# Patient Record
Sex: Male | Born: 1940 | Race: Black or African American | Hispanic: No | Marital: Married | Smoking: Never smoker
Health system: Southern US, Community
[De-identification: ages and names within clinical notes are randomized; demographics above are authoritative.]

## PROBLEM LIST (undated history)

## (undated) DIAGNOSIS — R3911 Hesitancy of micturition: Secondary | ICD-10-CM

## (undated) DIAGNOSIS — C9000 Multiple myeloma not having achieved remission: Secondary | ICD-10-CM

## (undated) DIAGNOSIS — M1990 Unspecified osteoarthritis, unspecified site: Secondary | ICD-10-CM

## (undated) DIAGNOSIS — I10 Essential (primary) hypertension: Secondary | ICD-10-CM

## (undated) HISTORY — PX: TRANSURETHRAL RESECTION OF PROSTATE: SHX73

---

## 1996-12-04 HISTORY — PX: BRAIN SURGERY: SHX531

## 2012-12-04 DIAGNOSIS — C9000 Multiple myeloma not having achieved remission: Secondary | ICD-10-CM

## 2012-12-04 HISTORY — DX: Multiple myeloma not having achieved remission: C90.00

## 2013-03-18 ENCOUNTER — Other Ambulatory Visit: Payer: Self-pay | Admitting: Neurosurgery

## 2013-03-19 ENCOUNTER — Encounter (HOSPITAL_COMMUNITY)
Admission: RE | Admit: 2013-03-19 | Discharge: 2013-03-19 | Disposition: A | Discharge status: Home / Self Care | Payer: Medicare Other | Source: Ambulatory Visit | Attending: Neurosurgery | Admitting: Neurosurgery

## 2013-03-19 ENCOUNTER — Encounter (HOSPITAL_COMMUNITY)
Admission: RE | Admit: 2013-03-19 | Discharge: 2013-03-19 | Disposition: A | Discharge status: Home / Self Care | Payer: Medicare Other | Source: Ambulatory Visit | Attending: Anesthesiology | Admitting: Anesthesiology

## 2013-03-19 ENCOUNTER — Encounter (HOSPITAL_COMMUNITY): Payer: Self-pay

## 2013-03-19 HISTORY — DX: Essential (primary) hypertension: I10

## 2013-03-19 HISTORY — DX: Unspecified osteoarthritis, unspecified site: M19.90

## 2013-03-19 LAB — CBC
Hemoglobin: 8.6 g/dL — ABNORMAL LOW (ref 13.0–17.0)
MCH: 28.7 pg (ref 26.0–34.0)
MCV: 86.7 fL (ref 78.0–100.0)
RBC: 3 MIL/uL — ABNORMAL LOW (ref 4.22–5.81)
WBC: 5.7 10*3/uL (ref 4.0–10.5)

## 2013-03-19 LAB — BASIC METABOLIC PANEL
CO2: 25 mEq/L (ref 19–32)
Glucose, Bld: 107 mg/dL — ABNORMAL HIGH (ref 70–99)
Potassium: 4.3 mEq/L (ref 3.5–5.1)
Sodium: 131 mEq/L — ABNORMAL LOW (ref 135–145)

## 2013-03-19 MED ORDER — CEFAZOLIN SODIUM-DEXTROSE 2-3 GM-% IV SOLR
2.0000 g | INTRAVENOUS | Status: AC
  Administered 2013-03-20: 2 g via INTRAVENOUS
  Filled 2013-03-19: qty 50

## 2013-03-19 MED ORDER — DEXAMETHASONE SODIUM PHOSPHATE 10 MG/ML IJ SOLN
10.0000 mg | INTRAMUSCULAR | Status: AC
  Administered 2013-03-20: 10 mg via INTRAVENOUS
  Filled 2013-03-19: qty 1

## 2013-03-19 NOTE — Progress Notes (Signed)
Consulted with dr Lodema Hong re labs.please call ologist in rm in am to inform about labs

## 2013-03-19 NOTE — Pre-Procedure Instructions (Signed)
Anthony Walls  03/19/2013   Your procedure is scheduled on:  03/20/13  Report to [facility] at 930 AM.  Call this number if you have problems the morning of surgery: [phone]   Remember:   Do not eat food or drink liquids after midnight. Take with a small sip of  Pain med ,cyclobenzaprine,flomax, dexamethasone    Do not wear jewelry, make-up or nail polish.  Do not wear lotions, powders, or perfumes. You may wear deodorant.  Do not shave 48 hours prior to surgery. Men may shave face and neck.  Do not bring valuables to the hospital.  Contacts, dentures or bridgework may not be worn into surgery.  Leave suitcase in the car. After surgery it may be brought to your room.  For patients admitted to the hospital, checkout time is 11:00 AM the day of  discharge.   Patients discharged the day of surgery will not be allowed to drive  home.  Name and phone number of your driver:  Special Instructions: Shower using CHG 2 nights before surgery and the night before surgery.  If you shower the day of surgery use CHG.  Use special wash - you have one bottle of CHG for all showers.  You should use approximately 1/3 of the bottle for each shower.   Please read over the following fact sheets that you were given: Pain Booklet, Coughing and Deep Breathing, Blood Transfusion Information, MRSA Information and Surgical Site Infection Prevention

## 2013-03-20 ENCOUNTER — Encounter (HOSPITAL_COMMUNITY)
Admission: RE | Disposition: A | Discharge status: Rehabilitation Facility | Payer: Self-pay | Source: Ambulatory Visit | Attending: Neurosurgery

## 2013-03-20 ENCOUNTER — Inpatient Hospital Stay (HOSPITAL_COMMUNITY)
Admission: RE | Admit: 2013-03-20 | Discharge: 2013-03-25 | DRG: 821 | Disposition: A | Discharge status: Rehabilitation Facility | Payer: Medicare Other | Source: Ambulatory Visit | Attending: Neurosurgery | Admitting: Neurosurgery

## 2013-03-20 ENCOUNTER — Inpatient Hospital Stay (HOSPITAL_COMMUNITY): Payer: Medicare Other | Admitting: Anesthesiology

## 2013-03-20 ENCOUNTER — Inpatient Hospital Stay (HOSPITAL_COMMUNITY): Payer: Medicare Other

## 2013-03-20 ENCOUNTER — Encounter (HOSPITAL_COMMUNITY): Payer: Self-pay | Admitting: Vascular Surgery

## 2013-03-20 ENCOUNTER — Encounter (HOSPITAL_COMMUNITY): Payer: Self-pay | Admitting: *Deleted

## 2013-03-20 DIAGNOSIS — K5900 Constipation, unspecified: Secondary | ICD-10-CM | POA: Diagnosis not present

## 2013-03-20 DIAGNOSIS — G992 Myelopathy in diseases classified elsewhere: Secondary | ICD-10-CM | POA: Diagnosis present

## 2013-03-20 DIAGNOSIS — I9589 Other hypotension: Secondary | ICD-10-CM | POA: Diagnosis not present

## 2013-03-20 DIAGNOSIS — Z01812 Encounter for preprocedural laboratory examination: Secondary | ICD-10-CM

## 2013-03-20 DIAGNOSIS — Z833 Family history of diabetes mellitus: Secondary | ICD-10-CM

## 2013-03-20 DIAGNOSIS — C9030 Solitary plasmacytoma not having achieved remission: Principal | ICD-10-CM | POA: Diagnosis present

## 2013-03-20 DIAGNOSIS — Z79899 Other long term (current) drug therapy: Secondary | ICD-10-CM

## 2013-03-20 DIAGNOSIS — D62 Acute posthemorrhagic anemia: Secondary | ICD-10-CM | POA: Diagnosis not present

## 2013-03-20 DIAGNOSIS — I10 Essential (primary) hypertension: Secondary | ICD-10-CM | POA: Diagnosis present

## 2013-03-20 DIAGNOSIS — R0902 Hypoxemia: Secondary | ICD-10-CM | POA: Diagnosis not present

## 2013-03-20 HISTORY — PX: LAMINECTOMY: SHX219

## 2013-03-20 HISTORY — DX: Hesitancy of micturition: R39.11

## 2013-03-20 SURGERY — THORACIC LAMINECTOMY FOR TUMOR
Anesthesia: General | Wound class: Clean

## 2013-03-20 MED ORDER — CYCLOBENZAPRINE HCL 10 MG PO TABS
10.0000 mg | ORAL_TABLET | Freq: Three times a day (TID) | ORAL | Status: DC | PRN
  Filled 2013-03-20: qty 1

## 2013-03-20 MED ORDER — KCL IN DEXTROSE-NACL 20-5-0.45 MEQ/L-%-% IV SOLN
80.0000 mL/h | INTRAVENOUS | Status: DC
  Administered 2013-03-20 – 2013-03-21 (×2): 80 mL/h via INTRAVENOUS
  Filled 2013-03-20 (×11): qty 1000

## 2013-03-20 MED ORDER — PROMETHAZINE HCL 25 MG/ML IJ SOLN: 6.2500 mg | INTRAMUSCULAR | Status: DC | PRN

## 2013-03-20 MED ORDER — ACETAMINOPHEN 10 MG/ML IV SOLN
INTRAVENOUS | Status: AC
  Administered 2013-03-20: 1000 mg via INTRAVENOUS
  Filled 2013-03-20: qty 100

## 2013-03-20 MED ORDER — GLYCOPYRROLATE 0.2 MG/ML IJ SOLN
INTRAMUSCULAR | Status: DC | PRN
  Administered 2013-03-20: .8 mg via INTRAVENOUS

## 2013-03-20 MED ORDER — 0.9 % SODIUM CHLORIDE (POUR BTL) OPTIME
TOPICAL | Status: DC | PRN
  Administered 2013-03-20: 1000 mL

## 2013-03-20 MED ORDER — ARTIFICIAL TEARS OP OINT
TOPICAL_OINTMENT | OPHTHALMIC | Status: DC | PRN
  Administered 2013-03-20: 1 via OPHTHALMIC

## 2013-03-20 MED ORDER — SODIUM CHLORIDE 0.9 % IV SOLN
INTRAVENOUS | Status: AC
  Filled 2013-03-20: qty 500

## 2013-03-20 MED ORDER — ONDANSETRON HCL 4 MG/2ML IJ SOLN
4.0000 mg | INTRAMUSCULAR | Status: DC | PRN
  Administered 2013-03-23 – 2013-03-25 (×2): 4 mg via INTRAVENOUS
  Filled 2013-03-20 (×2): qty 2

## 2013-03-20 MED ORDER — THROMBIN 20000 UNITS EX SOLR
CUTANEOUS | Status: DC | PRN
  Administered 2013-03-20 (×2): via TOPICAL

## 2013-03-20 MED ORDER — NEOSTIGMINE METHYLSULFATE 1 MG/ML IJ SOLN
INTRAMUSCULAR | Status: DC | PRN
  Administered 2013-03-20: 5 mg via INTRAVENOUS

## 2013-03-20 MED ORDER — SODIUM CHLORIDE 0.9 % IJ SOLN: 3.0000 mL | INTRAMUSCULAR | Status: DC | PRN

## 2013-03-20 MED ORDER — OXYCODONE HCL 5 MG PO TABS: 5.0000 mg | ORAL_TABLET | Freq: Once | ORAL | Status: DC | PRN

## 2013-03-20 MED ORDER — DEXAMETHASONE SODIUM PHOSPHATE 4 MG/ML IJ SOLN
4.0000 mg | Freq: Four times a day (QID) | INTRAMUSCULAR | Status: AC
  Administered 2013-03-20: 4 mg via INTRAVENOUS
  Filled 2013-03-20 (×2): qty 1

## 2013-03-20 MED ORDER — OXYCODONE HCL 5 MG/5ML PO SOLN: 5.0000 mg | Freq: Once | ORAL | Status: DC | PRN

## 2013-03-20 MED ORDER — DEXAMETHASONE 4 MG PO TABS
4.0000 mg | ORAL_TABLET | Freq: Four times a day (QID) | ORAL | Status: AC
  Administered 2013-03-21 (×3): 4 mg via ORAL
  Filled 2013-03-20 (×3): qty 1

## 2013-03-20 MED ORDER — ALBUMIN HUMAN 5 % IV SOLN
INTRAVENOUS | Status: DC | PRN
  Administered 2013-03-20 (×2): via INTRAVENOUS

## 2013-03-20 MED ORDER — LIDOCAINE HCL (CARDIAC) 20 MG/ML IV SOLN
INTRAVENOUS | Status: DC | PRN
  Administered 2013-03-20: 100 mg via INTRAVENOUS

## 2013-03-20 MED ORDER — BUPIVACAINE HCL (PF) 0.5 % IJ SOLN
INTRAMUSCULAR | Status: DC | PRN
  Administered 2013-03-20: 30 mL

## 2013-03-20 MED ORDER — DEXTROSE 5 % IV SOLN
INTRAVENOUS | Status: DC | PRN
  Administered 2013-03-20 (×2): via INTRAVENOUS

## 2013-03-20 MED ORDER — PROPOFOL 10 MG/ML IV BOLUS
INTRAVENOUS | Status: DC | PRN
  Administered 2013-03-20: 170 mg via INTRAVENOUS

## 2013-03-20 MED ORDER — MENTHOL 3 MG MT LOZG: 1.0000 | LOZENGE | OROMUCOSAL | Status: DC | PRN

## 2013-03-20 MED ORDER — MORPHINE SULFATE 2 MG/ML IJ SOLN: 1.0000 mg | INTRAMUSCULAR | Status: DC | PRN

## 2013-03-20 MED ORDER — BACITRACIN 50000 UNITS IM SOLR
INTRAMUSCULAR | Status: AC
  Filled 2013-03-20: qty 1

## 2013-03-20 MED ORDER — CEFAZOLIN SODIUM-DEXTROSE 2-3 GM-% IV SOLR
2.0000 g | Freq: Three times a day (TID) | INTRAVENOUS | Status: AC
  Administered 2013-03-20 – 2013-03-21 (×4): 2 g via INTRAVENOUS
  Filled 2013-03-20 (×5): qty 50

## 2013-03-20 MED ORDER — ACETAMINOPHEN 650 MG RE SUPP: 650.0000 mg | RECTAL | Status: DC | PRN

## 2013-03-20 MED ORDER — FENTANYL CITRATE 0.05 MG/ML IJ SOLN
INTRAMUSCULAR | Status: DC | PRN
  Administered 2013-03-20 (×2): 100 ug via INTRAVENOUS
  Administered 2013-03-20: 50 ug via INTRAVENOUS

## 2013-03-20 MED ORDER — LACTATED RINGERS IV SOLN
INTRAVENOUS | Status: DC | PRN
  Administered 2013-03-20 (×2): via INTRAVENOUS

## 2013-03-20 MED ORDER — ONDANSETRON HCL 4 MG/2ML IJ SOLN
INTRAMUSCULAR | Status: DC | PRN
  Administered 2013-03-20: 4 mg via INTRAVENOUS

## 2013-03-20 MED ORDER — OXYCODONE-ACETAMINOPHEN 5-325 MG PO TABS
1.0000 | ORAL_TABLET | ORAL | Status: DC | PRN
  Administered 2013-03-22 – 2013-03-25 (×2): 1 via ORAL
  Filled 2013-03-20 (×2): qty 1

## 2013-03-20 MED ORDER — ACETAMINOPHEN 325 MG PO TABS
650.0000 mg | ORAL_TABLET | ORAL | Status: DC | PRN
  Administered 2013-03-23 – 2013-03-24 (×2): 650 mg via ORAL
  Filled 2013-03-20 (×2): qty 2

## 2013-03-20 MED ORDER — SODIUM CHLORIDE 0.9 % IV SOLN: 250.0000 mL | INTRAVENOUS | Status: DC

## 2013-03-20 MED ORDER — THROMBIN 5000 UNITS EX SOLR
OROMUCOSAL | Status: DC | PRN
  Administered 2013-03-20 (×2): via TOPICAL

## 2013-03-20 MED ORDER — ROCURONIUM BROMIDE 100 MG/10ML IV SOLN
INTRAVENOUS | Status: DC | PRN
  Administered 2013-03-20 (×2): 10 mg via INTRAVENOUS
  Administered 2013-03-20: 70 mg via INTRAVENOUS

## 2013-03-20 MED ORDER — LOSARTAN POTASSIUM 50 MG PO TABS
50.0000 mg | ORAL_TABLET | Freq: Every day | ORAL | Status: DC
  Administered 2013-03-21 – 2013-03-25 (×4): 50 mg via ORAL
  Filled 2013-03-20 (×5): qty 1

## 2013-03-20 MED ORDER — LACTATED RINGERS IV SOLN
INTRAVENOUS | Status: DC | PRN
  Administered 2013-03-20: 12:00:00 via INTRAVENOUS

## 2013-03-20 MED ORDER — TAMSULOSIN HCL 0.4 MG PO CAPS
0.4000 mg | ORAL_CAPSULE | Freq: Every day | ORAL | Status: DC
  Administered 2013-03-20 – 2013-03-25 (×6): 0.4 mg via ORAL
  Filled 2013-03-20 (×6): qty 1

## 2013-03-20 MED ORDER — BACITRACIN 50000 UNITS IM SOLR
INTRAMUSCULAR | Status: DC | PRN
  Administered 2013-03-20: 13:00:00

## 2013-03-20 MED ORDER — SODIUM CHLORIDE 0.9 % IJ SOLN
3.0000 mL | Freq: Two times a day (BID) | INTRAMUSCULAR | Status: DC
  Administered 2013-03-21 – 2013-03-25 (×8): 3 mL via INTRAVENOUS

## 2013-03-20 MED ORDER — PHENOL 1.4 % MT LIQD: 1.0000 | OROMUCOSAL | Status: DC | PRN

## 2013-03-20 MED ORDER — SODIUM CHLORIDE 0.9 % IV SOLN
INTRAVENOUS | Status: DC | PRN
  Administered 2013-03-20: 15:00:00 via INTRAVENOUS

## 2013-03-20 MED ORDER — HYDROMORPHONE HCL PF 1 MG/ML IJ SOLN: 0.2500 mg | INTRAMUSCULAR | Status: DC | PRN

## 2013-03-20 SURGICAL SUPPLY — 83 items
BAG DECANTER FOR FLEXI CONT (MISCELLANEOUS) ×2 IMPLANT
BENZOIN TINCTURE PRP APPL 2/3 (GAUZE/BANDAGES/DRESSINGS) ×2 IMPLANT
BLADE SURG 11 STRL SS (BLADE) IMPLANT
BLADE SURG ROTATE 9660 (MISCELLANEOUS) IMPLANT
BONE CANC CHIPS 40CC CAN1/2 (Bone Implant) ×2 IMPLANT
BONE EQUIVA 10CC (Bone Implant) ×4 IMPLANT
BRUSH SCRUB EZ PLAIN DRY (MISCELLANEOUS) ×2 IMPLANT
BUR CUTTER 7.0 ROUND (BURR) ×2 IMPLANT
BUR MATCHSTICK NEURO 3.0 LAGG (BURR) ×2 IMPLANT
BUR PRECISION FLUTE 6.0 (BURR) IMPLANT
CANISTER SUCTION 2500CC (MISCELLANEOUS) ×2 IMPLANT
CHIPS CANC BONE 40CC CAN1/2 (Bone Implant) ×1 IMPLANT
CLOTH BEACON ORANGE TIMEOUT ST (SAFETY) ×2 IMPLANT
CONT SPEC 4OZ CLIKSEAL STRL BL (MISCELLANEOUS) ×4 IMPLANT
COVER TABLE BACK 60X90 (DRAPES) ×2 IMPLANT
DECANTER SPIKE VIAL GLASS SM (MISCELLANEOUS) IMPLANT
DERMABOND ADVANCED (GAUZE/BANDAGES/DRESSINGS) ×2
DERMABOND ADVANCED .7 DNX12 (GAUZE/BANDAGES/DRESSINGS) ×2 IMPLANT
DRAPE C-ARM 42X72 X-RAY (DRAPES) ×4 IMPLANT
DRAPE C-ARMOR (DRAPES) ×2 IMPLANT
DRAPE LAPAROTOMY 100X72 PEDS (DRAPES) IMPLANT
DRAPE LAPAROTOMY 100X72X124 (DRAPES) ×2 IMPLANT
DRAPE LAPAROTOMY T 102X78X121 (DRAPES) IMPLANT
DRAPE MICROSCOPE LEICA (MISCELLANEOUS) ×2 IMPLANT
DRAPE MICROSCOPE ZEISS OPMI (DRAPES) IMPLANT
DRAPE POUCH INSTRU U-SHP 10X18 (DRAPES) ×2 IMPLANT
DRAPE SURG 17X23 STRL (DRAPES) ×4 IMPLANT
DRESSING TELFA 8X3 (GAUZE/BANDAGES/DRESSINGS) ×2 IMPLANT
DURAPREP 26ML APPLICATOR (WOUND CARE) ×2 IMPLANT
ELECT REM PT RETURN 9FT ADLT (ELECTROSURGICAL) ×2
ELECTRODE REM PT RTRN 9FT ADLT (ELECTROSURGICAL) ×1 IMPLANT
EVACUATOR 1/8 PVC DRAIN (DRAIN) IMPLANT
EVACUATOR 3/16  PVC DRAIN (DRAIN) ×1
EVACUATOR 3/16 PVC DRAIN (DRAIN) ×1 IMPLANT
GAUZE SPONGE 4X4 16PLY XRAY LF (GAUZE/BANDAGES/DRESSINGS) ×2 IMPLANT
GLOVE BIO SURGEON STRL SZ8 (GLOVE) ×2 IMPLANT
GLOVE BIOGEL PI IND STRL 7.0 (GLOVE) ×1 IMPLANT
GLOVE BIOGEL PI INDICATOR 7.0 (GLOVE) ×1
GLOVE ECLIPSE 7.5 STRL STRAW (GLOVE) ×4 IMPLANT
GLOVE EXAM NITRILE LRG STRL (GLOVE) IMPLANT
GLOVE EXAM NITRILE MD LF STRL (GLOVE) ×4 IMPLANT
GLOVE EXAM NITRILE XL STR (GLOVE) IMPLANT
GLOVE EXAM NITRILE XS STR PU (GLOVE) IMPLANT
GLOVE INDICATOR 8.5 STRL (GLOVE) ×4 IMPLANT
GLOVE SURG SS PI 7.0 STRL IVOR (GLOVE) ×4 IMPLANT
GOWN BRE IMP SLV AUR LG STRL (GOWN DISPOSABLE) ×4 IMPLANT
GOWN BRE IMP SLV AUR XL STRL (GOWN DISPOSABLE) ×6 IMPLANT
GOWN STRL REIN 2XL LVL4 (GOWN DISPOSABLE) IMPLANT
HEMOSTAT POWDER KIT SURGIFOAM (HEMOSTASIS) ×4 IMPLANT
HEMOSTAT SURGICEL 2X14 (HEMOSTASIS) IMPLANT
KIT BASIN OR (CUSTOM PROCEDURE TRAY) ×2 IMPLANT
KIT ROOM TURNOVER OR (KITS) ×2 IMPLANT
MILL MEDIUM DISP (BLADE) ×2 IMPLANT
NEEDLE HYPO 22GX1.5 SAFETY (NEEDLE) ×2 IMPLANT
NS IRRIG 1000ML POUR BTL (IV SOLUTION) ×2 IMPLANT
PACK LAMINECTOMY NEURO (CUSTOM PROCEDURE TRAY) ×2 IMPLANT
PATTIES SURGICAL .5 X3 (DISPOSABLE) IMPLANT
PEDIGUARD CURV (INSTRUMENTS) ×2 IMPLANT
PIN MAYFIELD SKULL DISP (PIN) IMPLANT
ROD PERBENT 80MM (Rod) ×4 IMPLANT
RUBBERBAND STERILE (MISCELLANEOUS) ×4 IMPLANT
SCREW SEQUOIA 5.5X40MM (Screw) ×8 IMPLANT
SLEEVE SURGEON STRL (DRAPES) ×2 IMPLANT
SPONGE GAUZE 4X4 12PLY (GAUZE/BANDAGES/DRESSINGS) ×2 IMPLANT
SPONGE LAP 4X18 X RAY DECT (DISPOSABLE) IMPLANT
SPONGE SURGIFOAM ABS GEL 100 (HEMOSTASIS) ×4 IMPLANT
SPONGE SURGIFOAM ABS GEL SZ50 (HEMOSTASIS) IMPLANT
STAPLER VISISTAT 35W (STAPLE) IMPLANT
STRIP CLOSURE SKIN 1/2X4 (GAUZE/BANDAGES/DRESSINGS) ×6 IMPLANT
SUT ETHILON 4 0 PS 2 18 (SUTURE) IMPLANT
SUT NURALON 4 0 TR CR/8 (SUTURE) IMPLANT
SUT PROLENE 6 0 BV (SUTURE) IMPLANT
SUT VIC AB 0 CT1 18XCR BRD8 (SUTURE) ×1 IMPLANT
SUT VIC AB 0 CT1 8-18 (SUTURE) ×1
SUT VIC AB 2-0 OS6 18 (SUTURE) ×10 IMPLANT
SUT VIC AB 3-0 CP2 18 (SUTURE) ×2 IMPLANT
SYR 20ML ECCENTRIC (SYRINGE) IMPLANT
TAPE CLOTH 4X10 WHT NS (GAUZE/BANDAGES/DRESSINGS) ×2 IMPLANT
TOP CLSR SEQUOIA (Orthopedic Implant) ×8 IMPLANT
TOWEL OR 17X24 6PK STRL BLUE (TOWEL DISPOSABLE) ×2 IMPLANT
TOWEL OR 17X26 10 PK STRL BLUE (TOWEL DISPOSABLE) ×2 IMPLANT
TRAY FOLEY CATH 14FRSI W/METER (CATHETERS) ×2 IMPLANT
WATER STERILE IRR 1000ML POUR (IV SOLUTION) ×2 IMPLANT

## 2013-03-20 NOTE — Anesthesia Preprocedure Evaluation (Addendum)
Anesthesia Evaluation  Patient identified by MRN, date of birth, ID band Patient awake    Reviewed: Allergy & Precautions, H&P , NPO status , Patient's Chart, lab work & pertinent test results  History of Anesthesia Complications Negative for: history of anesthetic complications  Airway Mallampati: II TM Distance: >3 FB Neck ROM: Full    Dental  (+) Dental Advisory Given, Edentulous Upper and Edentulous Lower   Pulmonary neg pulmonary ROS,  03-19-13 Chest X-Ray CHEST - 2 VIEW   Comparison: None.   Findings: Cardiomediastinal silhouette is unremarkable.  Mild degenerative changes thoracic spine.  No acute infiltrate or pleural effusion.  No pulmonary edema.  Probable old fracture of the left fifth rib.   IMPRESSION: No active disease.  Degenerative changes thoracic spine.      Pulmonary exam normal       Cardiovascular Exercise Tolerance: Poor hypertension, Pt. on medications Rhythm:Irregular Rate:Normal  19-Mar-2013 15:01:08 [facility] ROUTINE RECORD Sinus rhythm with marked sinus arrhythmia with occasional Premature ventricular complexes Left ventricular hypertrophy Abnormal ECG No old tracing to compare 61mm/s 35mm/mV 100Hz  8.0.1 12SL 241 CID: 1 Referred by: Aliene Beams Confirmed By: Susa Griffins MD Vent. rate 80 BPM   Neuro/Psych negative neurological ROS  negative psych ROS   GI/Hepatic negative GI ROS, Neg liver ROS,   Endo/Other  negative endocrine ROS  Renal/GU negative Renal ROS     Musculoskeletal   Abdominal   Peds  Hematology   Anesthesia Other Findings   Reproductive/Obstetrics                      Anesthesia Physical Anesthesia Plan  ASA: III  Anesthesia Plan: General   Post-op Pain Management:    Induction: Intravenous  Airway Management Planned: Oral ETT  Additional Equipment:   Intra-op Plan:   Post-operative Plan: Extubation  in OR  Informed Consent: I have reviewed the patients History and Physical, chart, labs and discussed the procedure including the risks, benefits and alternatives for the proposed anesthesia with the patient or authorized representative who has indicated his/her understanding and acceptance.   Dental advisory given  Plan Discussed with: CRNA, Anesthesiologist and Surgeon  Anesthesia Plan Comments:        Anesthesia Quick Evaluation

## 2013-03-20 NOTE — Progress Notes (Signed)
Dr. Krista Blue notified of preop labs

## 2013-03-20 NOTE — Progress Notes (Addendum)
Patient reports that he has been unable to urinate since last night. Pt without suprapubic tenderness

## 2013-03-20 NOTE — OR Nursing (Signed)
Upon foley insertion it was noted that the patient was already dribbling urine on self. Also, patient has hypospadias. Foley insertion went without any problems.

## 2013-03-20 NOTE — H&P (Signed)
Anthony Walls is an 72 y.o. male.   Chief Complaint: Lower extremity discoordination HPI: The patient is a 72 year old gentleman who was evaluated in the office recently for lower extremity discoordination and difficulty with his gait. He reports a month or greater history of pain in his back and some difficulty with gait. Over the last few weeks however this is become markedly worse particularly with difficulty ambulating. 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 There was marked spinal cord compression in this area this was felt to be his clinical problem. The options were discussed and because of his progressive difficulty and large abnormality noted was elected to the patient this time for decompression and pedicle screw instrumentation with onlay fusion. I've had a long discussion with him regarding the risks and benefits of surgical intervention. The risks discussed include but are not limited to bleeding and infection weakness numbness paralysis trouble with urination sexual function spinal fluid leakage coma and death. We have discussed alternative methods of therapy offered risks and benefits of nonintervention. He has had the opportunity to ask numerous questions and appears to understand. With this information in hand he has requested that we proceed with surgery.  Past Medical History  Diagnosis Date  . Hypertension   . Arthritis     Past Surgical History  Procedure Laterality Date  . Brain surgery  98    tumor    History reviewed. No pertinent family history. Social History:  reports that he has never smoked. He does not have any smokeless tobacco history on file. He reports that he does not drink alcohol or use illicit drugs.  Allergies: No Known Allergies  Medications Prior to Admission  Medication Sig Dispense Refill  .  cholestyramine (QUESTRAN) 4 G packet Take 1 packet by mouth daily.      . cyclobenzaprine (FLEXERIL) 10 MG tablet Take 10 mg by mouth 3 (three) times daily as needed for muscle spasms.      Marland Kitchen dexamethasone (DECADRON) 4 MG tablet Take 4 mg by mouth 4 (four) times daily.      Marland Kitchen etodolac (LODINE) 300 MG capsule Take 300 mg by mouth every 4 (four) hours as needed (for inflammation).      Marland Kitchen HYDROcodone-acetaminophen (NORCO/VICODIN) 5-325 MG per tablet Take 1 tablet by mouth every 4 (four) hours as needed for pain.      Marland Kitchen losartan (COZAAR) 50 MG tablet Take 50 mg by mouth daily.      . mupirocin ointment (BACTROBAN) 2 % Apply topically 3 (three) times daily.      . tamsulosin (FLOMAX) 0.4 MG CAPS Take 0.4 mg by mouth daily.        Results for orders placed during the hospital encounter of 03/19/13 (from the past 48 hour(s))  BASIC METABOLIC PANEL     Status: Abnormal   Collection Time    03/19/13  2:40 PM      Result Value Range   Sodium 131 (*) 135 - 145 mEq/L   Potassium 4.3  3.5 - 5.1 mEq/L   Chloride 98  96 - 112 mEq/L   CO2 25  19 - 32 mEq/L   Glucose, Bld 107 (*) 70 - 99 mg/dL   BUN 28 (*) 6 - 23 mg/dL   Creatinine, Ser 1.61  0.50 - 1.35 mg/dL   Calcium 9.4  8.4 - 09.6 mg/dL  GFR calc non Af Amer 52 (*) >90 mL/min   GFR calc Af Amer 61 (*) >90 mL/min   Comment:            The eGFR has been calculated     using the CKD EPI equation.     This calculation has not been     validated in all clinical     situations.     eGFR's persistently     <90 mL/min signify     possible Chronic Kidney Disease.  CBC     Status: Abnormal   Collection Time    03/19/13  2:40 PM      Result Value Range   WBC 5.7  4.0 - 10.5 K/uL   RBC 3.00 (*) 4.22 - 5.81 MIL/uL   Hemoglobin 8.6 (*) 13.0 - 17.0 g/dL   HCT 16.1 (*) 09.6 - 04.5 %   MCV 86.7  78.0 - 100.0 fL   MCH 28.7  26.0 - 34.0 pg   MCHC 33.1  30.0 - 36.0 g/dL   RDW 40.9 (*) 81.1 - 91.4 %   Platelets 310  150 - 400 K/uL  SURGICAL PCR SCREEN      Status: Abnormal   Collection Time    03/19/13  2:48 PM      Result Value Range   MRSA, PCR NEGATIVE  NEGATIVE   Staphylococcus aureus POSITIVE (*) NEGATIVE   Comment:            The Xpert SA Assay (FDA     approved for NASAL specimens     in patients over 77 years of age),     is one component of     a comprehensive surveillance     program.  Test performance has     been validated by [employer] for patients greater     than or equal to 45 year old.     It is not intended     to diagnose infection nor to     guide or monitor treatment.  TYPE AND SCREEN     Status: None   Collection Time    03/19/13  2:53 PM      Result Value Range   ABO/RH(D) O NEG     Antibody Screen NEG     Sample Expiration 03/22/2013    ABO/RH     Status: None   Collection Time    03/19/13  2:53 PM      Result Value Range   ABO/RH(D) O NEG     Dg Chest 2 View  03/19/2013  *RADIOLOGY REPORT*  Clinical Data: Preadmission for laminectomy  CHEST - 2 VIEW  Comparison: None.  Findings: Cardiomediastinal silhouette is unremarkable.  Mild degenerative changes thoracic spine.  No acute infiltrate or pleural effusion.  No pulmonary edema.  Probable old fracture of the left fifth rib.  IMPRESSION: No active disease.  Degenerative changes thoracic spine.   Original Report Authenticated By: Natasha Mead, M.D.     Review of systems not obtained due to patient factors.  Blood pressure 161/72, pulse 82, temperature 98.1 F (36.7 C), temperature source Oral, resp. rate 18, SpO2 99.00%.  The patient is awake or and oriented. His gait is markedly unstable and spastic and he is barely able to ambulate a few feet. For strengthening he'll muscle testing however is intact. He can feel light touch but says that it feels unusual in his lower extremities. 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 There obviously other clinical possibilities. The plan is for a decompression of the spinal dura with pedicle screws one level above and one level below and onlay fusion.  Reinaldo Meeker, MD 03/20/2013, 11:50 AM

## 2013-03-20 NOTE — Anesthesia Procedure Notes (Signed)
Procedure Name: Intubation Date/Time: 03/20/2013 12:26 PM Performed by: Tyrone Nine Pre-anesthesia Checklist: Patient identified, Timeout performed, Emergency Drugs available, Suction available and Patient being monitored Patient Re-evaluated:Patient Re-evaluated prior to inductionOxygen Delivery Method: Circle system utilized Preoxygenation: Pre-oxygenation with 100% oxygen Intubation Type: IV induction Ventilation: Mask ventilation without difficulty and Oral airway inserted - appropriate to patient size Laryngoscope Size: Mac and 3 Grade View: Grade I Tube type: Oral Tube size: 8.0 mm Number of attempts: 1 Airway Equipment and Method: Stylet Placement Confirmation: ETT inserted through vocal cords under direct vision,  positive ETCO2 and breath sounds checked- equal and bilateral Secured at: 23 cm Tube secured with: Tape Dental Injury: Teeth and Oropharynx as per pre-operative assessment

## 2013-03-20 NOTE — Transfer of Care (Signed)
Immediate Anesthesia Transfer of Care Note  Patient: Anthony Walls  Procedure(s) Performed: Procedure(s) with comments: THORACIC LAMINECTOMY FOR TUMOR (N/A) - Thoracic Laminectomy for Thoracic Ten Tumor, Pedicle Screws at Thoracic Nine through Eleven  Patient Location: PACU  Anesthesia Type:General  Level of Consciousness: awake, alert , oriented and patient cooperative  Airway & Oxygen Therapy: Patient Spontanous Breathing and Patient connected to face mask oxygen  Post-op Assessment: Report given to PACU RN and Post -op Vital signs reviewed and stable  Post vital signs: Reviewed and stable  Complications: No apparent anesthesia complications

## 2013-03-20 NOTE — Op Note (Signed)
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 Localizing fluoroscopy was used prior to incision to identify the appropriate level. Midline incision was made above the spinous processes of T9-T10 and T11. Using Bovie cutting current the incision was carried on the spinous processes. Subperiosteal dissection was then carried out on the spinous processes lamina facet joint and the head of the ribs of T9 and T11 bilaterally. Antebrachial of soft tissue and destruction of the spinous process at T10. Self-retaining tract was placed for exposure and x-ray showed approach the appropriate level. Using the Leksell rongeur spinous process and soft tissue in the epidural space was removed to decompress the underlying spinal dura. 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 We're able to get excellent removal of almost all of the soft tissue tumor including tumor that had eroded into the vertebral body and completely destroyed the pedicle and facet joint. This was noted bilaterally we therefore felt we would definitely need to stabilize the end of the case. When we had removed all the soft tissue tumor and had completely decompressed the spinal dura we looked for further compression and could not find any. Claudette Laws a severe gauge were carried out and any bleeding control proper coagulation and Gelfoam and Surgifoam. We then placed pedicle screws at T9 and T11 in standard  fashion. We used high-speed drill for entry points and then passed the ultrasound-guided pedicle all the pedicle without difficulty and followed in AP lateral fluoroscopy. We tapped with a 4.5 mm tap and then placed 5.5 x 40 mm screws at T9 and T11 bilaterally. We then decorticated the far lateral region and performed a posterolateral fusion with a mixture of bony croutons and morselized allograft. We then chose appropriate length rod and secured to the top of the screws with top loading nuts and did tightening and final tightening with torque and counter torque. We then confirmed hemostasis once more and left to epidural drain in the epidural space and brought out through separate stab incisions. The was then closed in multiple layers of Vicryl on the muscle fascia subcutaneous and subcuticular tissues. Dermabond Steri-Strips were placed on the skin. Shortness was then applied and the patient was extubated and taken to recovery room in stable condition.

## 2013-03-20 NOTE — Progress Notes (Signed)
Receiving report from Elise RN at this time   

## 2013-03-20 NOTE — Anesthesia Postprocedure Evaluation (Signed)
Anesthesia Post Note  Patient: Anthony Walls  Procedure(s) Performed: Procedure(s) (LRB): THORACIC LAMINECTOMY FOR TUMOR (N/A)  Anesthesia type: general  Patient location: PACU  Post pain: Pain level controlled  Post assessment: Patient's Cardiovascular Status Stable  Last Vitals:  Filed Vitals:   03/20/13 1653  BP: 137/72  Pulse: 58  Temp:   Resp: 11    Post vital signs: Reviewed and stable  Level of consciousness: sedated  Complications: No apparent anesthesia complications

## 2013-03-20 NOTE — Preoperative (Signed)
Beta Blockers   Reason not to administer Beta Blockers:Not Applicable 

## 2013-03-21 LAB — CBC
HCT: 22.9 % — ABNORMAL LOW (ref 39.0–52.0)
Hemoglobin: 7.9 g/dL — ABNORMAL LOW (ref 13.0–17.0)
MCV: 84.8 fL (ref 78.0–100.0)
RBC: 2.7 MIL/uL — ABNORMAL LOW (ref 4.22–5.81)
RDW: 16.6 % — ABNORMAL HIGH (ref 11.5–15.5)
WBC: 8.6 10*3/uL (ref 4.0–10.5)

## 2013-03-21 NOTE — Evaluation (Signed)
Physical Therapy Evaluation Patient Details Name: Anthony Walls MRN: [id number] DOB: Apr 25, 1941 Today's Date: 03/21/2013 Time: 4098-1191 PT Time Calculation (min): 43 min  PT Assessment / Plan / Recommendation Clinical Impression  Pt adm with thoracic tumor and underwent resection of tumor and then fusion of T9 - T11.  Needs skilled PT to maximize I and safety so pt can return home with supportive wife.  Pt's LE strength is functionally weaker than to formal muscle testing and pt uses arms heavily for all mobility.  Feel pt would be excellent CIR candidate.    PT Assessment  Patient needs continued PT services    Follow Up Recommendations  CIR    Does the patient have the potential to tolerate intense rehabilitation      Barriers to Discharge        Equipment Recommendations  Rolling walker with 5" wheels    Recommendations for Other Services     Frequency Min 5X/week    Precautions / Restrictions Precautions Precautions: Fall;Back Required Braces or Orthoses: Spinal Brace Spinal Brace: Lumbar corset;Applied in sitting position   Pertinent Vitals/Pain VSS      Mobility  Bed Mobility Bed Mobility: Rolling Right;Right Sidelying to Sit Rolling Right: 4: Min assist;With rail Right Sidelying to Sit: With rails;3: Mod assist Details for Bed Mobility Assistance: Verbal cues for technique and assist to bring legs off of bed and trunk up. Transfers Transfers: Sit to Stand;Stand to Sit;Squat Pivot Transfers Sit to Stand: 4: Min assist;With upper extremity assist;From bed Stand to Sit: 4: Min assist;With upper extremity assist;To bed;To chair/3-in-1;With armrests Squat Pivot Transfers: 4: Min assist;With armrests Details for Transfer Assistance: Pt with heavy use of arms for transfers.  When pivoting bed to chair pt sat on very edge of chair before scooting back to safer position. Ambulation/Gait Ambulation/Gait Assistance: 3: Mod assist Ambulation Distance (Feet): 10  Feet Assistive device: Rolling walker Ambulation/Gait Assistance Details: Verbal cues to stay closer to walker and to pause to more fully extend hips and knees.  Pt with very heavy reliance on UE's.  Gait Pattern: Step-to pattern;Right flexed knee in stance;Left flexed knee in stance;Decreased step length - right;Decreased step length - left    Exercises     PT Diagnosis: Difficulty walking;Generalized weakness  PT Problem List: Decreased strength;Decreased activity tolerance;Decreased balance;Decreased mobility;Decreased knowledge of use of DME;Decreased knowledge of precautions PT Treatment Interventions: DME instruction;Gait training;Patient/family education;Functional mobility training;Stair training;Therapeutic activities;Therapeutic exercise;Balance training;Neuromuscular re-education   PT Goals Acute Rehab PT Goals PT Goal Formulation: With patient Time For Goal Achievement: 04/04/13 Potential to Achieve Goals: Good Pt will Roll Supine to Right Side: with modified independence PT Goal: Rolling Supine to Right Side - Progress: Goal set today Pt will Roll Supine to Left Side: with modified independence PT Goal: Rolling Supine to Left Side - Progress: Goal set today Pt will go Supine/Side to Sit: with modified independence PT Goal: Supine/Side to Sit - Progress: Goal set today Pt will Sit at Edge of Bed: with modified independence PT Goal: Sit at [place] Of Bed - Progress: Goal set today Pt will go Sit to Supine/Side: with modified independence PT Goal: Sit to Supine/Side - Progress: Goal set today Pt will go Sit to Stand: with supervision PT Goal: Sit to Stand - Progress: Goal set today Pt will go Stand to Sit: with supervision PT Goal: Stand to Sit - Progress: Goal set today Pt will Ambulate: 51 - 150 feet;with min assist PT Goal: Ambulate - Progress: Goal set today  Visit Information  Last PT Received On: 03/21/13 Assistance Needed: +2    Subjective Data  Subjective: Pt  states a few months ago he was normal. Patient Stated Goal: Return to prior level of function.   Prior Functioning  Home Living Lives With: Spouse Available Help at Discharge: Family Type of Home: House Home Access: Stairs to enter Secretary/administrator of Steps: 3 Entrance Stairs-Rails: Left Home Layout: One level Bathroom Shower/Tub: Engineer, manufacturing systems: Standard Home Adaptive Equipment: Environmental consultant - standard;Straight cane Prior Function Level of Independence: Independent with assistive device(s) Able to Take Stairs?: Yes Driving: Yes Vocation: Retired Comments: Pt was totally normal until 1-2 months ago.  Since then has needed a cane and then recently a walker. Communication Communication: No difficulties Dominant Hand: Right    Cognition  Cognition Arousal/Alertness: Awake/alert Behavior During Therapy: WFL for tasks assessed/performed Overall Cognitive Status: Within Functional Limits for tasks assessed    Extremity/Trunk Assessment Right Lower Extremity Assessment RLE ROM/Strength/Tone: Deficits RLE ROM/Strength/Tone Deficits: grossly 4/5 but functionally appears weaker Left Lower Extremity Assessment LLE ROM/Strength/Tone: Deficits LLE ROM/Strength/Tone Deficits: grossly 3+/5 but functionally appears weaker   Balance Balance Balance Assessed: Yes Static Sitting Balance Static Sitting - Balance Support: Bilateral upper extremity supported Static Sitting - Level of Assistance: 5: Stand by assistance;4: Min assist Static Sitting - Comment/# of Minutes: Pt sat EOB ~20 minutes.  Initially pt able to sit with supervision but as time incr pt began leaning posteriorly and to the left.  Pt aware of this loss of balance but unable to correct without assist. Static Standing Balance Static Standing - Balance Support: Bilateral upper extremity supported (Heavy support on UE's.) Static Standing - Level of Assistance: 4: Min assist  End of Session PT - End of  Session Equipment Utilized During Treatment: Gait belt;Back brace Activity Tolerance: Patient tolerated treatment well Patient left: in chair;with call bell/phone within reach Nurse Communication: Mobility status  GP     [facility] 03/21/2013, 10:15 AM  Skip Mayer PT [phone]

## 2013-03-21 NOTE — Progress Notes (Signed)
Subjective: Patient reports Patient grade significant proven away legs feel strength out of 5 wound is clean and dry  Objective: Vital signs in last 24 hours: Temp:  [96.2 F (35.7 C)-98.5 F (36.9 C)] 97.9 F (36.6 C) (04/18 0700) Pulse Rate:  [58-82] 67 (04/18 0800) Resp:  [10-20] 16 (04/18 0800) BP: (110-166)/(58-92) 134/70 mmHg (04/18 0800) SpO2:  [99 %-100 %] 100 % (04/18 0800) Weight:  [95.1 kg (209 lb 10.5 oz)] 95.1 kg (209 lb 10.5 oz) (04/17 1700)  Intake/Output from previous day: 04/17 0701 - 04/18 0700 In: 4889.3 [I.V.:3689.3; Blood:600; IV Piggyback:600] Out: 5870 [Urine:4620; Drains:250; Blood:1000] Intake/Output this shift: Total I/O In: 80 [I.V.:80] Out: 325 [Urine:325]  strength is 5 out of 5 wound is clean and dry  Lab Results:  Recent Labs  03/19/13 1440 03/21/13 0440  WBC 5.7 8.6  HGB 8.6* 7.9*  HCT 26.0* 22.9*  PLT 310 263   BMET  Recent Labs  03/19/13 1440  NA 131*  K 4.3  CL 98  CO2 25  GLUCOSE 107*  BUN 28*  CREATININE 1.32  CALCIUM 9.4    Studies/Results: Dg Chest 2 View  03/19/2013  *RADIOLOGY REPORT*  Clinical Data: Preadmission for laminectomy  CHEST - 2 VIEW  Comparison: None.  Findings: Cardiomediastinal silhouette is unremarkable.  Mild degenerative changes thoracic spine.  No acute infiltrate or pleural effusion.  No pulmonary edema.  Probable old fracture of the left fifth rib.  IMPRESSION: No active disease.  Degenerative changes thoracic spine.   Original Report Authenticated By: Natasha Mead, M.D.    Dg Thoracic Spine 2 View  03/20/2013  *RADIOLOGY REPORT*  Clinical Data: 72 year old male undergoing resection of T10 mass, T9-T11 pedicle screws.  THORACIC SPINE - 2 VIEW  Comparison: Preoperative chest radiograph 03/19/2013.  Fluoroscopy time of 1.0 minutes was utilized.  Findings: 12 full size ribs visible on the comparison. Intraoperative frontal and lateral fluoroscopic views coned-down on the lower thoracic spine do appear  consistent with T9 and T11 transpedicular hardware placement surrounding the T10 vertebral body.  IMPRESSION: T9 through T11 level surgery as detailed above peri   Original Report Authenticated By: Erskine Speed, M.D.     Assessment/Plan: Posterior day 1 from a thoracic laminectomy for tumor and reconstruction patient feels great looks to me still much better continue mobilization with physical and outpatient therapy we'll continue in the ICU we'll keep his Foley in 1 additional day.  LOS: 1 day     Vincenza Dail P 03/21/2013, 8:33 AM

## 2013-03-21 NOTE — Progress Notes (Signed)
Occupational Therapy Evaluation   03/21/13 1053  OT Visit Information  Last OT Received On 03/21/13  Assistance Needed +2  OT Time Calculation  OT Start Time 1025  OT Stop Time 1040  OT Time Calculation (min) 15 min  Precautions  Precautions Fall;Back  Required Braces or Orthoses Spinal Brace  Spinal Brace Lumbar corset;Applied in sitting position  Home Living  Lives With Spouse  Available Help at Discharge Family  Type of Home House  Home Access Stairs to enter  Entrance Stairs-Number of Steps 3  Entrance Stairs-Rails Left  Home Layout One level  Bathroom Shower/Tub Tub/shower unit  Theatre manager - standard;Straight cane  Prior Function  Level of Independence Independent with assistive device(s)  Able to Take Stairs? Yes  Driving Yes  Vocation Retired  Comments Has started using walker/cane within last 1-2 months.  Communication  Communication No difficulties  ADL  Grooming Performed;Wash/dry face;Set up  Where Assessed - Grooming Supported sitting  Upper Body Bathing Simulated;Set up  Where Assessed - Upper Body Bathing Supported sitting  Lower Body Bathing Simulated;Moderate assistance  Where Assessed - Lower Body Bathing Supported sit to stand  Upper Body Dressing Simulated;Minimal assistance  Where Assessed - Upper Body Dressing Supported sitting  Lower Body Dressing Simulated;Maximal assistance  Where Assessed - Lower Body Dressing Supported sit to Ecologist;Moderate assistance  Toilet Transfer Method Sit to Art therapist)  Equipment Used Back brace;Rolling walker;Gait belt  Transfers/Ambulation Related to ADLs Mod assist for sit<>Stand at chair.  ADL Comments Educated pt on back brace and proper positioning.  Pt demonstrated readjusting brace while sitting in chair with min assist from therapist for technique and placement.  Cognition  Arousal/Alertness Awake/alert   Behavior During Therapy WFL for tasks assessed/performed  Overall Cognitive Status Within Functional Limits for tasks assessed  Right Upper Extremity Assessment  RUE ROM/Strength/Tone WFL for tasks assessed  Left Upper Extremity Assessment  LUE ROM/Strength/Tone WFL for tasks assessed  Bed Mobility  Bed Mobility Not assessed  Transfers  Transfers Sit to Stand;Stand to Sit  Sit to Stand 3: Mod assist;From chair/3-in-1;With upper extremity assist  Stand to Sit 4: Min assist;To chair/3-in-1;With armrests;With upper extremity assist  Details for Transfer Assistance Incr time and effort for power up from chair. Assist for steadying.  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 Will continue to follow acutely.  OT Recommendation/Assessment Patient needs continued OT Services  OT Problem List Decreased strength;Decreased activity tolerance;Impaired balance (sitting and/or standing);Decreased knowledge of use of DME or AE;Decreased knowledge of precautions;Pain  OT Therapy Diagnosis  Generalized weakness;Acute pain  OT Plan  OT Frequency Min 2X/week  OT Treatment/Interventions Self-care/ADL training;DME and/or AE instruction;Therapeutic activities;Patient/family education;Balance training  OT Recommendation  Follow Up Recommendations CIR  OT Equipment (TBD)  Individuals Consulted  Consulted and Agree with Results and  Recommendations Patient  Acute Rehab OT Goals  OT Goal Formulation  With patient  Time For Goal Achievement 04/04/13  Potential to Achieve Goals Good  ADL Goals  Pt Will Perform Grooming with supervision;Standing at sink  Pt Will Perform Lower Body Bathing Sit to stand from chair;Sit to stand from bed;with adaptive equipment;with min assist  Pt Will Perform Upper Body Dressing with set-up;Sitting, chair;Sitting, bed;Unsupported  Pt Will Perform Lower Body Dressing with min assist;Sit to stand from chair;Sit to stand from bed;with adaptive equipment  Pt Will Transfer to Toilet with min assist;Ambulation;with DME;Comfort height toilet;Maintaining back safety precautions  Pt Will Perform Toileting - Clothing Manipulation with supervision;Standing  ADL Goal: Grooming - Progress Goal set today  ADL Goal: Lower Body Bathing - Progress Goal set today  ADL Goal: Upper Body Dressing - Progress Goal set today  ADL Goal: Lower Body Dressing - Progress Goal set today  ADL Goal: Toilet Transfer - Progress Goal set today  ADL Goal: Toileting - Clothing Manipulation - Progress Goal set today  Miscellaneous OT Goals  Miscellaneous OT Goal #1 Pt will perform bed mobility at supervision level as precursor for EOB ADLs.  OT Goal: Miscellaneous Goal #1 - Progress Goal set today  OT General Charges  $OT Visit 1 Procedure  OT Evaluation  $Initial OT Evaluation Tier I 1 Procedure  OT Treatments  $Self Care/Home Management  8-22 mins  Written Expression  Dominant Hand Right  03/21/2013 Cipriano Mile OTR/L Pager [phone] Office [phone]

## 2013-03-21 NOTE — Progress Notes (Addendum)
Rehab Admissions Coordinator Note:  Patient was screened by Meryl Dare for appropriateness for an Inpatient Acute Rehab Consult.  At this time, we are recommending Inpatient Rehab consult. Unable to reach attending MD to request an order. Have left a note re the request and informed pt's RN of need for MD order for CIR consult.  Meryl Dare 03/21/2013, 10:28 AM  I can be reached at [phone].

## 2013-03-21 NOTE — Progress Notes (Signed)
UR COMPLETED  

## 2013-03-22 NOTE — Progress Notes (Signed)
Physical Therapy Treatment Patient Details Name: Anthony Walls MRN: [id number] DOB: 03-21-1941 Today's Date: 03/22/2013 Time: 4098-1191 PT Time Calculation (min): 38 min  PT Assessment / Plan / Recommendation Comments on Treatment Session  72 y/o male s/p thoracic tumor resection and fusion. Progressing nicely today with postural control exercises in sitting and standing. LLE buckling during ambulation.     Follow Up Recommendations  CIR     Does the patient have the potential to tolerate intense rehabilitation     Barriers to Discharge        Equipment Recommendations  Rolling walker with 5" wheels    Recommendations for Other Services    Frequency Min 5X/week   Plan Discharge plan remains appropriate;Frequency remains appropriate    Precautions / Restrictions Precautions Precautions: Fall;Back Required Braces or Orthoses: Spinal Brace Spinal Brace: Lumbar corset;Applied in sitting position   Pertinent Vitals/Pain Reports soreness in his back with mobility but pain meds have given good relief    Mobility  Bed Mobility Bed Mobility: Rolling Left;Left Sidelying to Sit Rolling Left: 4: Min assist Left Sidelying to Sit: 4: Min assist;HOB flat Details for Bed Mobility Assistance: verbal sequencing cues for technique, truncal assist  Transfers Transfers: Sit to Stand;Stand to Sit Sit to Stand: 3: Mod assist;With upper extremity assist Stand to Sit: 3: Mod assist;With upper extremity assist Details for Transfer Assistance: cues for timing and sequencing of movement, facilitation for anterior translation of trunk and stability in standing; sit<>stand x3 Ambulation/Gait Ambulation/Gait Assistance: 1: +2 Total assist Ambulation/Gait: Patient Percentage: 70% Ambulation Distance (Feet): 5 Feet Assistive device: Rolling walker Ambulation/Gait Assistance Details: facilitation for midline trunk/hip positioning as well as stability for weight shift; LLE tends to buckle  Gait  Pattern: Step-to pattern;Decreased stride length General Gait Details: decreased step length and height; hips tend to shift to the right    Exercises General Exercises - Lower Extremity Ankle Circles/Pumps: AROM;Both;10 reps;Supine Gluteal Sets: AROM;Both;10 reps;Seated;Limitations Gluteal Sets Limitations: cueing for technique Long Arc Quad: AROM;Left;10 reps;Seated     PT Goals Acute Rehab PT Goals PT Goal: Rolling Supine to Left Side - Progress: Progressing toward goal PT Goal: Supine/Side to Sit - Progress: Progressing toward goal PT Goal: Sit at Edge Of Bed - Progress: Progressing toward goal PT Goal: Sit to Supine/Side - Progress: Progressing toward goal PT Goal: Sit to Stand - Progress: Progressing toward goal PT Goal: Stand to Sit - Progress: Progressing toward goal  Visit Information  Last PT Received On: 03/22/13    Subjective Data  Subjective: Im gonna do whatever you tell me to.    Cognition  Cognition Arousal/Alertness: Awake/alert Behavior During Therapy: WFL for tasks assessed/performed Overall Cognitive Status: Within Functional Limits for tasks assessed    Balance  Static Sitting Balance Static Sitting - Comment/# of Minutes: sat EOB 10 minutes, falls posteriorly with slow reaction time needing cueing for anterior trunk and weight shift forward; upper trunk also tends to drift to the left with poor awareness, pt cued to use visual input to improve awareness of truncal drift, when concentrating he is able to control but as he gets distracted he falls posteriorly and to the left Static Standing Balance Static Standing - Balance Support: Bilateral upper extremity supported Static Standing - Level of Assistance: 3: Mod assist Static Standing - Comment/# of Minutes: tends toward weight shift right needing facilitation with multiple points of contact to correct trunk and hips for improved alignment; LLE with decreased control when weight shifts   End of  Session PT -  End of Session Equipment Utilized During Treatment: Gait belt;Back brace Activity Tolerance: Patient tolerated treatment well Patient left: in chair;with call bell/phone within reach Nurse Communication: Mobility status   GP     [facility] [name] 03/22/2013, 12:53 PM

## 2013-03-22 NOTE — Progress Notes (Signed)
Subjective: Patient reports he is feeling better improve sensation and improved strength. Still has left greater right leg weakness  Objective: Vital signs in last 24 hours: Temp:  [97.5 F (36.4 C)-98.5 F (36.9 C)] 98.5 F (36.9 C) (04/19 0400) Pulse Rate:  [42-95] 56 (04/19 0700) Resp:  [10-25] 11 (04/19 0700) BP: (100-142)/(51-86) 117/69 mmHg (04/19 0700) SpO2:  [66 %-100 %] 99 % (04/19 0700)  Intake/Output from previous day: 04/18 0701 - 04/19 0700 In: 1470 [P.O.:860; I.V.:560; IV Piggyback:50] Out: 3005 [Urine:2825; Drains:180] Intake/Output this shift:    Strength 4+ out of 5 left lower extremity 5+ out of 5 right wound is dry drain output approximately 55 cc of her 12 hours we'll continue drain output will continue to mobilize in the ICU today possible transfer tomorrow  Lab Results:  Recent Labs  03/19/13 1440 03/21/13 0440  WBC 5.7 8.6  HGB 8.6* 7.9*  HCT 26.0* 22.9*  PLT 310 263   BMET  Recent Labs  03/19/13 1440  NA 131*  K 4.3  CL 98  CO2 25  GLUCOSE 107*  BUN 28*  CREATININE 1.32  CALCIUM 9.4    Studies/Results: Dg Thoracic Spine 2 View  03/20/2013  *RADIOLOGY REPORT*  Clinical Data: 72 year old male undergoing resection of T10 mass, T9-T11 pedicle screws.  THORACIC SPINE - 2 VIEW  Comparison: Preoperative chest radiograph 03/19/2013.  Fluoroscopy time of 1.0 minutes was utilized.  Findings: 12 full size ribs visible on the comparison. Intraoperative frontal and lateral fluoroscopic views coned-down on the lower thoracic spine do appear consistent with T9 and T11 transpedicular hardware placement surrounding the T10 vertebral body.  IMPRESSION: T9 through T11 level surgery as detailed above peri   Original Report Authenticated By: Erskine Speed, M.D.     Assessment/Plan: Continue to observe in the ICU with physical therapy possible transfer to floor tomorrow  LOS: 2 days     Izabelle Daus P 03/22/2013, 7:51 AM

## 2013-03-23 LAB — TYPE AND SCREEN
ABO/RH(D): O NEG
Unit division: 0

## 2013-03-23 LAB — BLOOD GAS, ARTERIAL
Drawn by: 222511
FIO2: 0.21 %
Patient temperature: 98.6
TCO2: 23.5 mmol/L (ref 0–100)
pCO2 arterial: 34.1 mmHg — ABNORMAL LOW (ref 35.0–45.0)
pH, Arterial: 7.433 (ref 7.350–7.450)

## 2013-03-23 MED ORDER — SODIUM CHLORIDE 0.9 % IV BOLUS (SEPSIS)
500.0000 mL | Freq: Once | INTRAVENOUS | Status: AC
  Administered 2013-03-23: 500 mL via INTRAVENOUS

## 2013-03-23 NOTE — Significant Event (Addendum)
Rapid Response Event Note  Overview: Time Called: 1445 Arrival Time: 1450 Event Type: Respiratory  Initial Focused Assessment: Called to see patient for low sats, and decreased BP.   Patient  dropped BP when he  stood up and experienced bilateral lower extremity weakness; he was placed in bed; nurses got O2Sat readings of 77, placed pt on NRB mask.  Upon arrival pateint is alert and oriented, no complaints of pain. Radial pulses are strong with some occasional irregularity  Interventions: Reviewed history. bilat ausc lungs  clear and good air movement; manipulated machinery to discover if reading was correct. Both hands are cool to touch and patient complains of numbness in middle finger of both hands.No complaints of pain from thoracic surgery. Took CBG from right htumb (110). Color good, respirations 20. Repeat BPcame back to baseline; CBG 110; O2 sats varied between 73 and 100 %;multiple machines checked to gain accuracy;  removed NRB mask and sats to 100%. ABG ordered to confirm or deny sat; ordered by CCM.  ABG results 97% sat and PO2 80. Dr Venetia Maxon notified; for IV fluids.    Event Summary:   Dr Venetia Maxon notified by RN at  1530; fluids ordered.    at          Kristine Linea

## 2013-03-23 NOTE — Progress Notes (Signed)
Subjective: Patient reports feeling stronger.  Objective: Vital signs in last 24 hours: Temp:  [97.4 F (36.3 C)-98.6 F (37 C)] 98.6 F (37 C) (04/20 0400) Pulse Rate:  [29-85] 78 (04/20 0700) Resp:  [12-23] 15 (04/20 0700) BP: (89-139)/(41-88) 127/52 mmHg (04/20 0700) SpO2:  [94 %-100 %] 100 % (04/20 0700)  Intake/Output from previous day: 04/19 0701 - 04/20 0700 In: 720 [P.O.:720] Out: 2507 [Urine:2495; Drains:12] Intake/Output this shift:    Physical Exam: Strength improving in left leg, per patient.  Drain minimal output.  Dressing CDI.  Lab Results:  Recent Labs  03/21/13 0440  WBC 8.6  HGB 7.9*  HCT 22.9*  PLT 263   BMET No results found for this basename: NA, K, CL, CO2, GLUCOSE, BUN, CREATININE, CALCIUM,  in the last 72 hours  Studies/Results: No results found.  Assessment/Plan: D/C Hemovac. Transfer to 4N.  Mobilize with PT.    LOS: 3 days    Dorian Heckle, MD 03/23/2013, 7:41 AM

## 2013-03-23 NOTE — Progress Notes (Signed)
Patient transferred from 3100 via stretcher.  Patient ambulated with a 2 person assist and walker to the recliner.  He and his wife and daughter were oriented to 4N and patient was given a lunch tray.  Will continue to monitor.  Lance Bosch, RN

## 2013-03-23 NOTE — Progress Notes (Addendum)
Patient recieved 500 cc bolus of NS.  He is alert and oriented, visiting with family and eating ice cream. I will continue to monitor patient.

## 2013-03-24 ENCOUNTER — Encounter (HOSPITAL_COMMUNITY): Payer: Self-pay | Admitting: Physical Medicine and Rehabilitation

## 2013-03-24 DIAGNOSIS — C9030 Solitary plasmacytoma not having achieved remission: Secondary | ICD-10-CM

## 2013-03-24 DIAGNOSIS — G8220 Paraplegia, unspecified: Secondary | ICD-10-CM

## 2013-03-24 LAB — POCT I-STAT 4, (NA,K, GLUC, HGB,HCT)
HCT: 22 % — ABNORMAL LOW (ref 39.0–52.0)
Sodium: 131 mEq/L — ABNORMAL LOW (ref 135–145)

## 2013-03-24 NOTE — Progress Notes (Signed)
Physical Therapy Treatment Patient Details Name: Anthony Walls MRN: [id number] DOB: 1941-08-27 Today's Date: 03/24/2013 Time: 4098-1191 PT Time Calculation (min): 38 min  PT Assessment / Plan / Recommendation Comments on Treatment Session  72 y/o male s/p thoracic tumor resection and fusion. More impulsive and jumpy today needing cues to stay on task and focus on trunk/lower extremity control. Still buckling and flexing during ambulation, very unsafe with gait needing 2 person assist.    Follow Up Recommendations  CIR     Does the patient have the potential to tolerate intense rehabilitation     Barriers to Discharge        Equipment Recommendations  Rolling walker with 5" wheels    Recommendations for Other Services    Frequency Min 5X/week   Plan Discharge plan remains appropriate;Frequency remains appropriate    Precautions / Restrictions Precautions Precautions: Fall;Back Required Braces or Orthoses: Spinal Brace Spinal Brace: Lumbar corset;Applied in sitting position Restrictions Weight Bearing Restrictions: No   Pertinent Vitals/Pain Reports soreness at the site of his incision    Mobility  Bed Mobility Bed Mobility: Rolling Right;Right Sidelying to Sit;Sitting - Scoot to [place] of Bed Rolling Right: 4: Min assist Details for Bed Mobility Assistance: verbal cues for technique, precautions Transfers Sit to Stand: 3: Mod assist;With upper extremity assist Stand to Sit: With upper extremity assist;4: Min assist Details for Transfer Assistance: verbal and physical cues for sequencing and too slow down and take his time Ambulation/Gait Ambulation/Gait Assistance: 1: +2 Total assist Ambulation/Gait: Patient Percentage: 40% Ambulation Distance (Feet): 10 Feet Assistive device: Rolling walker Ambulation/Gait Assistance Details: max stability assist bilaterally especially during swing on RLE, very flexed forward with poor trunk and LLE control  Gait Pattern: Step-through  pattern;Decreased stride length General Gait Details: decreased step length and height; hips tend to shift to the right    Exercises General Exercises - Lower Extremity Ankle Circles/Pumps: AROM;20 reps;Supine;Both Other Exercises Other Exercises: Pt sat EOB for dynamic sitting balance activities, reaching for objects, righting of posture     PT Goals Acute Rehab PT Goals PT Goal: Rolling Supine to Right Side - Progress: Progressing toward goal PT Goal: Supine/Side to Sit - Progress: Progressing toward goal PT Goal: Sit at Edge Of Bed - Progress: Progressing toward goal PT Goal: Sit to Stand - Progress: Progressing toward goal PT Goal: Stand to Sit - Progress: Progressing toward goal PT Goal: Ambulate - Progress: Progressing toward goal  Visit Information  Last PT Received On: 03/24/13 Assistance Needed: +2    Subjective Data  Subjective: Tell my wife what you just said.    Cognition  Cognition Arousal/Alertness: Awake/alert Behavior During Therapy: WFL for tasks assessed/performed Overall Cognitive Status: Impaired/Different from baseline Area of Impairment: Safety/judgement Safety/Judgement: Decreased awareness of deficits General Comments: impulsive, with decreased attention    Balance  Balance Balance Assessed: Yes Static Sitting Balance Static Sitting - Balance Support: Bilateral upper extremity supported Static Sitting - Comment/# of Minutes: when distracted or performing tasks with upper extremities he tends to fall posteriorly with delayed righting reaction Static Standing Balance Static Standing - Balance Support: Bilateral upper extremity supported Static Standing - Level of Assistance: 4: Min assist Static Standing - Comment/# of Minutes: facilitation at hip/trunk for engagement as well as stability at her knee Dynamic Standing Balance Dynamic Standing - Balance Support: Bilateral upper extremity supported Dynamic Standing - Level of Assistance: 3: Mod  assist;2: Max assist Dynamic Standing - Comments: performing ADLs at the sink  End of  Session PT - End of Session Equipment Utilized During Treatment: Gait belt;Back brace Activity Tolerance: Patient tolerated treatment well Patient left: in chair;with call bell/phone within reach   GP     [facility] [name] 03/24/2013, 4:12 PM

## 2013-03-24 NOTE — Progress Notes (Signed)
Occupational Therapy Treatment Patient Details Name: Anthony Walls MRN: [id number] DOB: 03-Jul-1941 Today's Date: 03/24/2013 Time: 4098-1191 OT Time Calculation (min): 38 min  OT Assessment / Plan / Recommendation Comments on Treatment Session Pt making progress and should continue with acute OT services to maximize level of safey and function. Pt requires verbal and physical cues to correct posture to upright position while standing and during ADL mobility. Pt very pleasant and motivated. Pt required increased time to complete tasks due to fatigues easily    Follow Up Recommendations  CIR    Barriers to Discharge   none    Equipment Recommendations  None recommended by OT;Other (comment) (TBD)    Recommendations for Other Services    Frequency Min 2X/week   Plan Discharge plan remains appropriate    Precautions / Restrictions Precautions Precautions: Fall;Back Required Braces or Orthoses: Spinal Brace Spinal Brace: Lumbar corset;Applied in sitting position Restrictions Weight Bearing Restrictions: No   Pertinent Vitals/Pain     ADL  Grooming: Performed;Wash/dry face;Set up;Wash/dry hands;Min guard Where Assessed - Grooming: Supported standing Toilet Transfer: Performed;Minimal Dentist Method: Sit to Barista: Regular height toilet;Grab bars Toileting - Clothing Manipulation and Hygiene: Performed;Moderate assistance Where Assessed - Toileting Clothing Manipulation and Hygiene: Standing Equipment Used: Back brace;Rolling walker;Gait belt Transfers/Ambulation Related to ADLs: verbal and physical cues for correct hand placement, sequencing and speed to slow down    OT Diagnosis:    OT Problem List:   OT Treatment Interventions:     OT Goals ADL Goals ADL Goal: Grooming - Progress: Progressing toward goals ADL Goal: Toilet Transfer - Progress: Progressing toward goals ADL Goal: Toileting - Clothing Manipulation - Progress:  Progressing toward goals Miscellaneous OT Goals OT Goal: Miscellaneous Goal #1 - Progress: Progressing toward goals  Visit Information  Last OT Received On: 03/24/13 Assistance Needed: +2    Subjective Data  Subjective: " I am doin ok " Patient Stated Goal: To return home after CIR   Prior Functioning       Cognition  Cognition Arousal/Alertness: Awake/alert Behavior During Therapy: WFL for tasks assessed/performed Overall Cognitive Status: Within Functional Limits for tasks assessed    Mobility  Bed Mobility Bed Mobility: Rolling Right;Right Sidelying to Sit;Sitting - Scoot to [place] of Bed Rolling Right: 4: Min assist Details for Bed Mobility Assistance: verbal cues for technique, precautions Transfers Transfers: Sit to Stand;Stand to Sit Sit to Stand: 3: Mod assist;With upper extremity assist Stand to Sit: With upper extremity assist;4: Min assist Details for Transfer Assistance: verbal and physical cues for sequencing and too slow down and take his time    Exercises  Other Exercises Other Exercises: Pt sat EOB for dynamic sitting balance activities, reaching for objects, righting of posture   Balance Balance Balance Assessed: Yes Static Standing Balance Static Standing - Balance Support: Bilateral upper extremity supported Static Standing - Level of Assistance: 4: Min assist Dynamic Standing Balance Dynamic Standing - Balance Support: Bilateral upper extremity supported Dynamic Standing - Level of Assistance: 3: Mod assist;2: Max assist   End of Session OT - End of Session Equipment Utilized During Treatment: Gait belt;Other (comment) (RW) Patient left: in chair;with call bell/phone within reach;with family/visitor present  GO     Galen Manila 03/24/2013, 4:04 PM

## 2013-03-24 NOTE — Consult Note (Signed)
Physical Medicine and Rehabilitation Consult  Reason for Consult: Back pain with difficulty walking Referring Physician: Dr. Gerlene Fee.   HPI: Anthony Walls is a 72 y.o. male with history of HTN, back pain with progressive problems with gait for the past month. 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 Patient admitted on 03/20/13 for T9-T10 decompression with tumor removal and T9-T11 fusion by Dr. Gerlene Fee. Heme Onc consulted for input.  Post op hypoxia with hypotension treated with IVF bolus. ABLA noted with hgb at 7.9.  Therapies initiated  and patient continues to be limited by LLE instability due to weakness. PT, OT recommending CIR.   Review of Systems  HENT: Negative for hearing loss.   Eyes: Negative for blurred vision and double vision.  Respiratory: Negative for shortness of breath.   Cardiovascular: Negative for chest pain and palpitations.  Gastrointestinal: Positive for constipation. Negative for heartburn and nausea.  Musculoskeletal: Positive for myalgias and back pain.  Neurological: Positive for sensory change and focal weakness. Negative for headaches.  Psychiatric/Behavioral: Negative for depression. The patient does not have insomnia.    Past Medical History  Diagnosis Date  . Hypertension   . Arthritis    Past Surgical History  Procedure Laterality Date  . Brain surgery  98    tumor   Family History  Problem Relation Age of Onset  . Diabetes Mother     Social History:  Married. Disabled since '98 ( due to gait problems past brain tumor). Wife works but on [employer] X 3 months.  He reports that he has never smoked. He does not have any smokeless tobacco history on file. He reports that he does not drink alcohol or use illicit drugs.  Allergies: No Known Allergies  Medications Prior to Admission  Medication Sig Dispense Refill  . cholestyramine (QUESTRAN) 4 G packet Take 1 packet by mouth daily.      .  cyclobenzaprine (FLEXERIL) 10 MG tablet Take 10 mg by mouth 3 (three) times daily as needed for muscle spasms.      Marland Kitchen dexamethasone (DECADRON) 4 MG tablet Take 4 mg by mouth 4 (four) times daily.      Marland Kitchen etodolac (LODINE) 300 MG capsule Take 300 mg by mouth every 4 (four) hours as needed (for inflammation).      Marland Kitchen HYDROcodone-acetaminophen (NORCO/VICODIN) 5-325 MG per tablet Take 1 tablet by mouth every 4 (four) hours as needed for pain.      Marland Kitchen losartan (COZAAR) 50 MG tablet Take 50 mg by mouth daily.      . [EXPIRED] mupirocin ointment (BACTROBAN) 2 % Apply topically 3 (three) times daily.      . tamsulosin (FLOMAX) 0.4 MG CAPS Take 0.4 mg by mouth daily.        Home: Home Living Lives With: Spouse Available Help at Discharge: Family Type of Home: House Home Access: Stairs to enter Secretary/administrator of Steps: 3 Entrance Stairs-Rails: Left Home Layout: One level Bathroom Shower/Tub: Engineer, manufacturing systems: Standard Home Adaptive Equipment: Environmental consultant - standard;Straight cane  Functional History: Prior Function Able to Take Stairs?: Yes Driving: Yes Vocation: Retired Comments: Has started using walker/cane within last 1-2 months. Functional Status:  Mobility: Bed Mobility Bed Mobility: Rolling Left;Left Sidelying to Sit Rolling Right: 4: Min assist;With rail Rolling Left: 4: Min assist Right Sidelying to Sit: With rails;3: Mod assist Left Sidelying to Sit: 4: Min assist;HOB flat Transfers Transfers: Sit to Stand;Stand to Sit Sit to  Stand: 3: Mod assist;With upper extremity assist Stand to Sit: 3: Mod assist;With upper extremity assist Squat Pivot Transfers: 4: Min assist;With armrests Ambulation/Gait Ambulation/Gait Assistance: 1: +2 Total assist Ambulation/Gait: Patient Percentage: 70% Ambulation Distance (Feet): 5 Feet Assistive device: Rolling walker Ambulation/Gait Assistance Details: facilitation for midline trunk/hip positioning as well as stability for  weight shift; LLE tends to buckle  Gait Pattern: Step-to pattern;Decreased stride length General Gait Details: decreased step length and height; hips tend to shift to the right    ADL: ADL Grooming: Performed;Wash/dry face;Set up Where Assessed - Grooming: Supported sitting Upper Body Bathing: Simulated;Set up Where Assessed - Upper Body Bathing: Supported sitting Lower Body Bathing: Simulated;Moderate assistance Where Assessed - Lower Body Bathing: Supported sit to stand Upper Body Dressing: Simulated;Minimal assistance Where Assessed - Upper Body Dressing: Supported sitting Lower Body Dressing: Simulated;Maximal assistance Where Assessed - Lower Body Dressing: Supported sit to Pharmacist, hospital: Simulated;Moderate assistance Toilet Transfer Method: Sit to Barista:  Nurse, children's) Equipment Used: Back brace;Rolling walker;Gait belt Transfers/Ambulation Related to ADLs: Mod assist for sit<>Stand at chair. ADL Comments: Educated pt on back brace and proper positioning.  Pt demonstrated readjusting brace while sitting in chair with min assist from therapist for technique and placement.  Cognition: Cognition Overall Cognitive Status: Within Functional Limits for tasks assessed Arousal/Alertness: Awake/alert Orientation Level: Oriented X4 Cognition Arousal/Alertness: Awake/alert Behavior During Therapy: WFL for tasks assessed/performed Overall Cognitive Status: Within Functional Limits for tasks assessed  Blood pressure 109/58, pulse 78, temperature 98.5 F (36.9 C), temperature source Oral, resp. rate 20, height 5\' 7"  (1.702 m), weight 95.1 kg (209 lb 10.5 oz), SpO2 100.00%. Physical Exam  Nursing note and vitals reviewed. Constitutional: He is oriented to person, place, and time. He appears well-developed and well-nourished.  HENT:  Head: Normocephalic and atraumatic.  Eyes: Pupils are equal, round, and reactive to light.  Neck: Normal range of motion.   Cardiovascular: Normal rate and regular rhythm.   Abdominal: Soft. Bowel sounds are normal. He exhibits no distension. There is no tenderness.  Musculoskeletal: He exhibits no edema.  Back incision dressed. Back expectedly tender.  Neurological: He is alert and oriented to person, place, and time.  LLE weakness with ataxia. Decreased proprioception. Fair pain and pinprick. Strength 4/5. UE exam generally normal.   Skin: Skin is warm and dry.  Psychiatric: He has a normal mood and affect. His behavior is normal. Judgment and thought content normal.    Results for orders placed during the hospital encounter of 03/20/13 (from the past 24 hour(s))  GLUCOSE, CAPILLARY     Status: Abnormal   Collection Time    03/23/13  2:51 PM      Result Value Range   Glucose-Capillary 110 (*) 70 - 99 mg/dL  BLOOD GAS, ARTERIAL     Status: Abnormal   Collection Time    03/23/13  3:14 PM      Result Value Range   FIO2 0.21     pH, Arterial 7.433  7.350 - 7.450   pCO2 arterial 34.1 (*) 35.0 - 45.0 mmHg   pO2, Arterial 80.2  80.0 - 100.0 mmHg   Bicarbonate 22.4  20.0 - 24.0 mEq/L   TCO2 23.5  0 - 100 mmol/L   Acid-base deficit 1.3  0.0 - 2.0 mmol/L   O2 Saturation 97.9     Patient temperature 98.6     Collection site LEFT RADIAL     Drawn by [id number]     Sample type  ARTERIAL DRAW     Allens test (pass/fail) PASS  PASS   No results found.  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 Does the need for close, 24 hr/day medical supervision in concert with the patient's rehab needs make it unreasonable for this patient to be served in a less intensive setting? Yes 2. Co-Morbidities requiring supervision/potential complications: pain mgt, wound care 3. Due to bladder management, bowel management, safety, skin/wound care, disease management, medication administration, pain management and patient education, does the  patient require 24 hr/day rehab nursing? Yes 4. Does the patient require coordinated care of a physician, rehab nurse, PT (1-2 hrs/day, 5 days/week) and OT (1-2 hrs/day, 5 days/week) to address physical and functional deficits in the context of the above medical diagnosis(es)? Yes Addressing deficits in the following areas: balance, endurance, locomotion, strength, transferring, bowel/bladder control, bathing, dressing, feeding, grooming, toileting and psychosocial support 5. Can the patient actively participate in an intensive therapy program of at least 3 hrs of therapy per day at least 5 days per week? Yes 6. The potential for patient to make measurable gains while on inpatient rehab is excellent 7. Anticipated functional outcomes upon discharge from inpatient rehab are supervision  with PT, supervision to min assist with OT, n/a with SLP. 8. Estimated rehab length of stay to reach the above functional goals is: 2.5 weeks 9. Does the patient have adequate social supports to accommodate these discharge functional goals? Yes 10. Anticipated D/C setting: Home 11. Anticipated post D/C treatments: Outpt therapy 12. Overall Rehab/Functional Prognosis: excellent  RECOMMENDATIONS: This patient's condition is appropriate for continued rehabilitative care in the following setting: CIR Patient has agreed to participate in recommended program. Yes Note that insurance prior authorization may be required for reimbursement for recommended care.  Comment:Rehab RN to follow up.   Ranelle Oyster, MD, [place] [facility]     03/24/2013

## 2013-03-24 NOTE — Progress Notes (Signed)
Patient ID: Sharif Rendell, male   DOB: 01-25-1941, 72 y.o.   MRN: [id number] Subjective: Patient reports better all the time...though not getting out of bed much!!  Objective: Vital signs in last 24 hours: Temp:  [98.1 F (36.7 C)-100.6 F (38.1 C)] 98.5 F (36.9 C) (04/21 0938) Pulse Rate:  [61-88] 78 (04/21 0938) Resp:  [16-20] 20 (04/21 0938) BP: (91-143)/(52-93) 116/60 mmHg (04/21 0938) SpO2:  [78 %-100 %] 100 % (04/21 0938)  Intake/Output from previous day: 04/20 0701 - 04/21 0700 In: 600 [P.O.:600] Out: 2565 [Urine:2565] Intake/Output this shift: Total I/O In: 240 [P.O.:240] Out: -   Wound:clean and dry; strength steadily improving  Lab Results: No results found for this basename: WBC, HGB, HCT, PLT,  in the last 72 hours BMET No results found for this basename: NA, K, CL, CO2, GLUCOSE, BUN, CREATININE, CALCIUM,  in the last 72 hours  Studies/Results: No results found.  Assessment/Plan: Doing steadily better. Will get CIR consult, as well as Heme Onc to see   LOS: 4 days  as above   Reinaldo Meeker, MD 03/24/2013, 10:20 AM

## 2013-03-25 ENCOUNTER — Encounter (HOSPITAL_COMMUNITY): Payer: Self-pay | Admitting: *Deleted

## 2013-03-25 ENCOUNTER — Encounter (HOSPITAL_COMMUNITY): Payer: Self-pay | Admitting: Neurosurgery

## 2013-03-25 ENCOUNTER — Inpatient Hospital Stay (HOSPITAL_COMMUNITY)
Admission: RE | Admit: 2013-03-25 | Discharge: 2013-04-04 | DRG: 949 | Disposition: A | Discharge status: Home / Self Care | Payer: Medicare Other | Source: Intra-hospital | Attending: Physical Medicine & Rehabilitation | Admitting: Physical Medicine & Rehabilitation

## 2013-03-25 DIAGNOSIS — R262 Difficulty in walking, not elsewhere classified: Secondary | ICD-10-CM | POA: Diagnosis present

## 2013-03-25 DIAGNOSIS — G8220 Paraplegia, unspecified: Secondary | ICD-10-CM

## 2013-03-25 DIAGNOSIS — M4714 Other spondylosis with myelopathy, thoracic region: Secondary | ICD-10-CM | POA: Diagnosis present

## 2013-03-25 DIAGNOSIS — I10 Essential (primary) hypertension: Secondary | ICD-10-CM | POA: Diagnosis present

## 2013-03-25 DIAGNOSIS — N400 Enlarged prostate without lower urinary tract symptoms: Secondary | ICD-10-CM | POA: Diagnosis present

## 2013-03-25 DIAGNOSIS — D492 Neoplasm of unspecified behavior of bone, soft tissue, and skin: Secondary | ICD-10-CM | POA: Diagnosis present

## 2013-03-25 DIAGNOSIS — C9030 Solitary plasmacytoma not having achieved remission: Secondary | ICD-10-CM | POA: Diagnosis present

## 2013-03-25 DIAGNOSIS — N319 Neuromuscular dysfunction of bladder, unspecified: Secondary | ICD-10-CM

## 2013-03-25 DIAGNOSIS — N289 Disorder of kidney and ureter, unspecified: Secondary | ICD-10-CM | POA: Diagnosis present

## 2013-03-25 DIAGNOSIS — C720 Malignant neoplasm of spinal cord: Secondary | ICD-10-CM

## 2013-03-25 DIAGNOSIS — G9520 Unspecified cord compression: Secondary | ICD-10-CM | POA: Diagnosis present

## 2013-03-25 DIAGNOSIS — M1990 Unspecified osteoarthritis, unspecified site: Secondary | ICD-10-CM | POA: Diagnosis present

## 2013-03-25 DIAGNOSIS — K592 Neurogenic bowel, not elsewhere classified: Secondary | ICD-10-CM

## 2013-03-25 DIAGNOSIS — D62 Acute posthemorrhagic anemia: Secondary | ICD-10-CM | POA: Diagnosis present

## 2013-03-25 DIAGNOSIS — Z79899 Other long term (current) drug therapy: Principal | ICD-10-CM

## 2013-03-25 DIAGNOSIS — K5900 Constipation, unspecified: Secondary | ICD-10-CM | POA: Diagnosis present

## 2013-03-25 DIAGNOSIS — E871 Hypo-osmolality and hyponatremia: Secondary | ICD-10-CM | POA: Diagnosis not present

## 2013-03-25 DIAGNOSIS — G959 Disease of spinal cord, unspecified: Secondary | ICD-10-CM | POA: Diagnosis present

## 2013-03-25 LAB — URINALYSIS, ROUTINE W REFLEX MICROSCOPIC
Bilirubin Urine: NEGATIVE
Ketones, ur: NEGATIVE mg/dL
Nitrite: NEGATIVE
Specific Gravity, Urine: 1.013 (ref 1.005–1.030)
Urobilinogen, UA: 0.2 mg/dL (ref 0.0–1.0)

## 2013-03-25 LAB — URINE MICROSCOPIC-ADD ON

## 2013-03-25 MED ORDER — OXYCODONE-ACETAMINOPHEN 5-325 MG PO TABS: 1.0000 | ORAL_TABLET | ORAL | Status: DC | PRN

## 2013-03-25 MED ORDER — MENTHOL 3 MG MT LOZG: 1.0000 | LOZENGE | OROMUCOSAL | Status: DC | PRN

## 2013-03-25 MED ORDER — LOSARTAN POTASSIUM 50 MG PO TABS
50.0000 mg | ORAL_TABLET | Freq: Every day | ORAL | Status: DC
  Administered 2013-03-26 – 2013-04-04 (×10): 50 mg via ORAL
  Filled 2013-03-25 (×12): qty 1

## 2013-03-25 MED ORDER — DIPHENHYDRAMINE HCL 12.5 MG/5ML PO ELIX
12.5000 mg | ORAL_SOLUTION | Freq: Four times a day (QID) | ORAL | Status: DC | PRN
  Filled 2013-03-25: qty 10

## 2013-03-25 MED ORDER — FLEET ENEMA 7-19 GM/118ML RE ENEM: 1.0000 | ENEMA | Freq: Once | RECTAL | Status: AC | PRN

## 2013-03-25 MED ORDER — POLYETHYLENE GLYCOL 3350 17 G PO PACK
17.0000 g | PACK | Freq: Every day | ORAL | Status: DC
  Administered 2013-03-25 – 2013-03-26 (×2): 17 g via ORAL
  Filled 2013-03-25 (×6): qty 1

## 2013-03-25 MED ORDER — PROCHLORPERAZINE EDISYLATE 5 MG/ML IJ SOLN
5.0000 mg | Freq: Four times a day (QID) | INTRAMUSCULAR | Status: DC | PRN
  Filled 2013-03-25: qty 2

## 2013-03-25 MED ORDER — TRAMADOL HCL 50 MG PO TABS
50.0000 mg | ORAL_TABLET | Freq: Four times a day (QID) | ORAL | Status: DC | PRN
  Administered 2013-03-31: 50 mg via ORAL
  Filled 2013-03-25: qty 1

## 2013-03-25 MED ORDER — POLYSACCHARIDE IRON COMPLEX 150 MG PO CAPS
150.0000 mg | ORAL_CAPSULE | Freq: Two times a day (BID) | ORAL | Status: DC
  Administered 2013-03-26 – 2013-04-03 (×17): 150 mg via ORAL
  Filled 2013-03-25 (×23): qty 1

## 2013-03-25 MED ORDER — MINERAL OIL RE ENEM
1.0000 | ENEMA | Freq: Once | RECTAL | Status: DC
  Filled 2013-03-25: qty 1

## 2013-03-25 MED ORDER — TAMSULOSIN HCL 0.4 MG PO CAPS
0.4000 mg | ORAL_CAPSULE | Freq: Every day | ORAL | Status: DC
  Administered 2013-03-26: 0.4 mg via ORAL
  Filled 2013-03-25 (×5): qty 1

## 2013-03-25 MED ORDER — ACETAMINOPHEN 325 MG PO TABS
325.0000 mg | ORAL_TABLET | ORAL | Status: DC | PRN
  Administered 2013-03-30: 650 mg via ORAL
  Filled 2013-03-25: qty 2

## 2013-03-25 MED ORDER — PHENOL 1.4 % MT LIQD: 1.0000 | OROMUCOSAL | Status: DC | PRN

## 2013-03-25 MED ORDER — ALUM & MAG HYDROXIDE-SIMETH 200-200-20 MG/5ML PO SUSP: 30.0000 mL | ORAL | Status: DC | PRN

## 2013-03-25 MED ORDER — FLEET ENEMA 7-19 GM/118ML RE ENEM
1.0000 | ENEMA | Freq: Once | RECTAL | Status: AC
  Administered 2013-03-25: 1 via RECTAL
  Filled 2013-03-25: qty 1

## 2013-03-25 MED ORDER — BISACODYL 10 MG RE SUPP
10.0000 mg | Freq: Every day | RECTAL | Status: DC | PRN
  Administered 2013-03-28: 10 mg via RECTAL
  Filled 2013-03-25: qty 1

## 2013-03-25 MED ORDER — PROCHLORPERAZINE MALEATE 5 MG PO TABS
5.0000 mg | ORAL_TABLET | Freq: Four times a day (QID) | ORAL | Status: DC | PRN
  Filled 2013-03-25: qty 2

## 2013-03-25 MED ORDER — GUAIFENESIN-DM 100-10 MG/5ML PO SYRP: 5.0000 mL | ORAL_SOLUTION | Freq: Four times a day (QID) | ORAL | Status: DC | PRN

## 2013-03-25 MED ORDER — TRAZODONE HCL 50 MG PO TABS: 25.0000 mg | ORAL_TABLET | Freq: Every evening | ORAL | Status: DC | PRN

## 2013-03-25 MED ORDER — PROCHLORPERAZINE 25 MG RE SUPP
12.5000 mg | Freq: Four times a day (QID) | RECTAL | Status: DC | PRN
  Filled 2013-03-25: qty 1

## 2013-03-25 MED ORDER — CYCLOBENZAPRINE HCL 10 MG PO TABS
10.0000 mg | ORAL_TABLET | Freq: Three times a day (TID) | ORAL | Status: DC | PRN
  Administered 2013-03-31: 10 mg via ORAL
  Filled 2013-03-25: qty 1

## 2013-03-25 NOTE — PMR Pre-admission (Signed)
PMR Admission Coordinator Pre-Admission Assessment  Patient: Anthony Walls is an 72 y.o., male MRN: [id number] DOB: 09/14/1941 Height: 5\' 7"  (170.2 cm) Weight: 95.1 kg (209 lb 10.5 oz)              Insurance Information HMO:      PPO:       PCP:       IPA:       80/20:       OTHER:   PRIMARY: Medicare A/B      Policy#: [id number]      Subscriber: Janet Berlin CM Name:        Phone#:       Fax#:   Pre-Cert#:        Employer: Retired Benefits:  Phone #:       Name: Armed forces technical officer. Date: 05/04/00     Deduct: $1216      Out of Pocket Max: none      Life Max: unlimited CIR: 100%      SNF: 100 days Outpatient: 80%     Co-Pay: 20% Home Health: 100%      Co-Pay: none DME: 80%     Co-Pay: 20% Providers: patient's choice.   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 male with history of HTN, back pain with progressive problems with gait for the past month. 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 Patient admitted on 03/20/13 for T9-T10 decompression with tumor removal and T9-T11 fusion by Dr. Gerlene Fee. Heme Onc consulted for input. Post op hypoxia with hypotension treated with IVF bolus. ABLA noted with hgb at 7.9. Therapies initiated and patient continues to be limited by LLE instability due to weakness. PT, OT recommending CIR.     Past Medical History  Past Medical History  Diagnosis Date  . Hypertension   . Arthritis     Family History  family history includes Diabetes in his mother.  Prior Rehab/Hospitalizations:  Had a brain tumor in 1998 and had therapy after  surgery.   Current Medications  Current facility-administered medications:0.9 %  sodium chloride infusion, 250 mL, Intravenous, Continuous, Reinaldo Meeker, MD;  acetaminophen (TYLENOL) suppository 650 mg, 650 mg, Rectal, Q4H PRN, Reinaldo Meeker, MD;  acetaminophen (TYLENOL) tablet 650 mg, 650 mg, Oral, Q4H PRN, Reinaldo Meeker, MD, 650 mg at 03/24/13 2153;  cyclobenzaprine (FLEXERIL) tablet 10 mg, 10 mg, Oral, TID PRN, Reinaldo Meeker, MD dextrose 5 % and 0.45 % NaCl with KCl 20 mEq/L infusion, 80 mL/hr, Intravenous, Continuous, Reinaldo Meeker, MD, 80 mL/hr at 03/21/13 0800;  losartan (COZAAR) tablet 50 mg, 50 mg, Oral, Daily, Reinaldo Meeker, MD, 50 mg at 03/25/13 1043;  menthol-cetylpyridinium (CEPACOL) lozenge 3 mg, 1 lozenge, Oral, PRN, Reinaldo Meeker, MD;  morphine 2 MG/ML injection 1-4 mg, 1-4 mg, Intravenous, Q3H PRN, Reinaldo Meeker, MD ondansetron [facility]) injection 4 mg, 4 mg, Intravenous, Q4H PRN, Reinaldo Meeker, MD, 4 mg at 03/23/13 1959;  oxyCODONE-acetaminophen (PERCOCET/ROXICET) 5-325 MG per tablet 1-2 tablet, 1-2 tablet, Oral, Q4H PRN, Reinaldo Meeker, MD, 1 tablet at 03/25/13 0840;  phenol (CHLORASEPTIC) mouth spray 1 spray, 1 spray,  Mouth/Throat, PRN, Reinaldo Meeker, MD sodium chloride 0.9 % injection 3 mL, 3 mL, Intravenous, Q12H, Reinaldo Meeker, MD, 3 mL at 03/25/13 1050;  sodium chloride 0.9 % injection 3 mL, 3 mL, Intravenous, PRN, Reinaldo Meeker, MD;  tamsulosin [facility]) capsule 0.4 mg, 0.4 mg, Oral, Daily, Reinaldo Meeker, MD, 0.4 mg at 03/25/13 1043  Patients Current Diet: General  Precautions / Restrictions Precautions Precautions: Fall;Back Precaution Comments: reviewed 3/3 back precautions as patient unable to recall any Spinal Brace: Lumbar corset;Applied in sitting position Restrictions Weight Bearing Restrictions: No   Prior Activity Level Limited Community (1-2x/wk): Went out 2-3 X a week.  Home Assistive Devices / Equipment Home Assistive  Devices/Equipment: Dan Humphreys (specify type);Eyeglasses;Dentures (specify type) Home Adaptive Equipment: Walker - standard;Straight cane  Prior Functional Level Prior Function Level of Independence: Independent with assistive device(s) Able to Take Stairs?: Yes Driving: Yes Vocation: Retired Comments: Has started using walker/cane within last 1-2 months.  Current Functional Level Cognition  Arousal/Alertness: Awake/alert Overall Cognitive Status: Within Functional Limits for tasks assessed Orientation Level: Oriented X4 Safety/Judgement: Decreased awareness of deficits General Comments: improved attention today with less stimulation and more closed environment today    Extremity Assessment (includes Sensation/Coordination)  RUE ROM/Strength/Tone: WFL for tasks assessed  RLE ROM/Strength/Tone: Deficits RLE ROM/Strength/Tone Deficits: grossly 4/5 but functionally appears weaker    ADLs  Grooming: Performed;Wash/dry face;Set up;Wash/dry hands;Min guard Where Assessed - Grooming: Supported standing Upper Body Bathing: Simulated;Set up Where Assessed - Upper Body Bathing: Supported sitting Lower Body Bathing: Simulated;Moderate assistance Where Assessed - Lower Body Bathing: Supported sit to stand Upper Body Dressing: Simulated;Minimal assistance Where Assessed - Upper Body Dressing: Supported sitting Lower Body Dressing: Simulated;Maximal assistance Where Assessed - Lower Body Dressing: Supported sit to Pharmacist, hospital: Performed;Minimal Dentist Method: Sit to Barista: Regular height toilet;Grab bars Toileting - Clothing Manipulation and Hygiene: Performed;Moderate assistance Where Assessed - Toileting Clothing Manipulation and Hygiene: Standing Equipment Used: Back brace;Rolling walker;Gait belt Transfers/Ambulation Related to ADLs: verbal and physical cues for correct hand placement, sequencing and speed to slow down ADL Comments:  Educated pt on back brace and proper positioning.  Pt demonstrated readjusting brace while sitting in chair with min assist from therapist for technique and placement.    Mobility  Bed Mobility: Rolling Right Rolling Right: 4: Min assist;With rail Rolling Left: 4: Min assist Right Sidelying to Sit: 4: Min assist Left Sidelying to Sit: 4: Min assist;HOB flat    Transfers  Transfers: Sit to Stand;Stand to Sit Sit to Stand: 3: Mod assist;With upper extremity assist;From bed Stand to Sit: 3: Mod assist;With upper extremity assist;To bed Squat Pivot Transfers: 4: Min assist;With armrests    Ambulation / Gait / Stairs / Wheelchair Mobility  Ambulation/Gait Ambulation/Gait Assistance: 3: Mod assist Ambulation/Gait: Patient Percentage: 40% Ambulation Distance (Feet): 12 Feet Assistive device: Rolling walker Ambulation/Gait Assistance Details: max step by step sequencing cues, facilitation for tall posture and engagement/control of core and LLE when weight bearing; control improved in LLE however it does still buckle, improved midline hips/trunk today; encouraged slow controlled movement  Gait Pattern: Trunk flexed;Step-through pattern;Decreased stride length General Gait Details: short but equal steps     Posture / Balance Static Sitting Balance Static Sitting - Balance Support: Bilateral upper extremity supported Static Sitting - Level of Assistance: 5: Stand by assistance;4: Min assist Static Sitting - Comment/# of Minutes: still falls posteriorly when participating in dynamic activities (i.e. putting on his brace) however not  losing complete control today and ability to self correct improving Static Standing Balance Static Standing - Balance Support: Bilateral upper extremity supported Static Standing - Level of Assistance: 4: Min assist Static Standing - Comment/# of Minutes: stood x3 for 30 seconds each with cueing and focus on engagement of LLE and tall posture; facilitation for  knee/hip/trunk extension Dynamic Standing Balance Dynamic Standing - Balance Support: Bilateral upper extremity supported Dynamic Standing - Level of Assistance: 3: Mod assist;2: Max assist Dynamic Standing - Comments: performing ADLs at the sink    Special needs/care consideration BiPAP/CPAP No CPM No Continuous Drip IV  D5 and 0.45% NS with KCL 20 meq/L at 80 ml/hr Dialysis No      Life Ves No Oxygen No Special Bed No Trach Size No Wound Vac (area) No       Skin No                             Bowel mgmt: No BM since 03/18/13 PTA Bladder mgmt: Currently has a foley catheter. Diabetic mgmt No    Previous Home Environment Living Arrangements: Spouse/significant other Lives With: Spouse Available Help at Discharge: Family Type of Home: House Home Layout: One level Home Access: Stairs to enter Entrance Stairs-Rails: Left Entrance Stairs-Number of Steps: 3 Bathroom Shower/Tub: Engineer, manufacturing systems: Standard Home Care Services: No  Discharge Living Setting Plans for Discharge Living Setting: Patient's home;House;Lives with (comment) (Lives with wife.) Type of Home at Discharge: House Discharge Home Layout: One level Discharge Home Access: Stairs to enter Entrance Stairs-Number of Steps: 3 at the front entry. Do you have any problems obtaining your medications?: No  Social/Family/Support Systems Patient Roles: Spouse;Parent Contact Information: Lyndel Sarate - wife Anticipated Caregiver: wife (Has 2 sons and 1 dtr.) Anticipated Caregiver's Contact Information: Clotilde Dieter - (h) [phone] (c) [phone] Ability/Limitations of Caregiver: Wife works, but she can take Medical laboratory scientific officer Availability: 24/7 Discharge Plan Discussed with Primary Caregiver: Yes Is Caregiver In Agreement with Plan?: Yes Does Caregiver/Family have Issues with Lodging/Transportation while Pt is in Rehab?: No   Goals/Additional Needs Patient/Family Goal for Rehab: PT S, OT S/Min A, no ST  goals Expected length of stay: 2.5 weeks Cultural Considerations: None Dietary Needs: Regular diet, thin liquids Equipment Needs: TBD Pt/Family Agrees to Admission and willing to participate: Yes Program Orientation Provided & Reviewed with Pt/Caregiver Including Roles  & Responsibilities: Yes   Decrease burden of Care through IP rehab admission: Not applicable   Possible need for SNF placement upon discharge:  Patient wishes to avoid SNF placement if possible.   Patient Condition: This patient's condition remains as documented in the consult dated 03/24/13, in which the Rehabilitation Physician determined and documented that the patient's condition is appropriate for intensive rehabilitative care in an inpatient rehabilitation facility. Will admit to inpatient rehab today.  Preadmission Screen Completed By:  Trish Mage, 03/25/2013 1:36 PM ______________________________________________________________________   Discussed status with Dr. Riley Kill on 03/25/13 at 1344 and received telephone approval for admission today.  Admission Coordinator:  Trish Mage, time1344/Date04/22/14

## 2013-03-25 NOTE — Progress Notes (Signed)
Report called to 4000 spoke with  Marylene Land  The receiving rn pt c/o constipation, fleet enema given with good result pt vomited x1 zofran 4mg  given and ptstated he feels very much better. Marland Kitchen Discharge to CIR room 4011 condition is stable. Pt belongings sent with him accompanied by his spouse. Azzie Roup Rn

## 2013-03-25 NOTE — Plan of Care (Signed)
Overall Plan of Care [facility]) Patient Details Name: Anthony Walls MRN: [id number] DOB: March 16, 1941  Diagnosis:  Plasmacytoma with T10 cord compression  Co-morbidities: OA, htn, abla, neurogenic bowel and bladder, BPH  Functional Problem List  Patient demonstrates impairments in the following areas: Balance, Bladder, Bowel, Cognition, Endurance, Medication Management, Pain, Perception, Safety, Sensory  and Skin Integrity  Basic ADL's: grooming, bathing, dressing and toileting Advanced ADL's: simple meal preparation and laundry  Transfers:  bed mobility, bed to chair, toilet, tub/shower, car and furniture Locomotion:  ambulation, wheelchair mobility and stairs  Additional Impairments:  Leisure Awareness  Anticipated Outcomes Item Anticipated Outcome  Eating/Swallowing    Basic self-care  Supervision  Tolieting  Supervision  Bowel/Bladder  Continent of bowel and bladder; bowel managed with medicine; bladder emptying managed with toilet training and medicine.  Transfers  Supervision  Locomotion  Supervision   Communication    Cognition    Pain  Pain less than 3.  Safety/Judgment    Other     Therapy Plan: PT Intensity: Minimum of 1-2 x/day ,45 to 90 minutes PT Frequency: 5 out of 7 days PT Duration Estimated Length of Stay: 10-14 days OT Intensity: Minimum of 1-2 x/day, 45 to 90 minutes OT Frequency: 5 out of 7 days OT Duration/Estimated Length of Stay: 7-10 days      Team Interventions: Item RN PT OT SLP SW TR Other  Self Care/Advanced ADL Retraining   x      Neuromuscular Re-Education  x x      Therapeutic Activities  x x      UE/LE Strength Training/ROM  x x      UE/LE Coordination Activities  x x      Visual/Perceptual Remediation/Compensation         DME/Adaptive Equipment Instruction  x x      Therapeutic Exercise  x x      Balance/Vestibular Training  x x      Patient/Family Education x x x      Cognitive Remediation/Compensation         Functional  Mobility Training  x x      Ambulation/Gait Training  x x      Stair Training  x       Wheelchair Propulsion/Positioning  x x      Functional Tourist information centre manager Reintegration  x x      Dysphagia/Aspiration Film/video editor         Bladder Management x        Bowel Management x        Disease Management/Prevention x x x      Pain Management x x x      Medication Management x        Skin Care/Wound Management x x x      Splinting/Orthotics  x x      Discharge Planning x x x      Psychosocial Support  x x                             Team Discharge Planning: Destination: PT-Home ,OT- Home , SLP-  Projected Follow-up: PT-Home health PT, OT-  Home health OT, SLP-  Projected Equipment Needs: PT-Rolling walker with 5" wheels, OT- 3 in 1 bedside comode;Tub/shower seat, SLP-  Patient/family involved in discharge planning: PT- Patient;Family member/caregiver,  OT-Patient;Family member/caregiver, SLP-   MD ELOS: 10 days Medical Rehab Prognosis:  Excellent Assessment: The patient has been admitted for CIR therapies. The team will be addressing, functional mobility, strength, stamina, balance, safety, adaptive techniques/equipment, self-care, bowel and bladder mgt, patient and caregiver education, NMR, pain mgt, brace donning and doffing. Goals have been set at supervision. Pt is quite motivated.    Ranelle Oyster, MD, FAAPMR      See Team Conference Notes for weekly updates to the plan of care

## 2013-03-25 NOTE — Progress Notes (Signed)
Physical Therapy Treatment Patient Details Name: Garry Nicolini MRN: [id number] DOB: Apr 09, 1941 Today's Date: 03/25/2013 Time: 4098-1191 PT Time Calculation (min): 31 min  PT Assessment / Plan / Recommendation Comments on Treatment Session  72 y/o male s/p thoracic tumor resection and fusion. Much improved participation and focus today with great results from the session. Control during ambulation is gradually improving. Noted plans to d/c to CIR today.     Follow Up Recommendations  CIR     Does the patient have the potential to tolerate intense rehabilitation     Barriers to Discharge        Equipment Recommendations  Rolling walker with 5" wheels    Recommendations for Other Services    Frequency Min 5X/week   Plan Discharge plan remains appropriate;Frequency remains appropriate    Precautions / Restrictions Precautions Precautions: Fall;Back Precaution Comments: reviewed 3/3 back precautions as patient unable to recall any Spinal Brace: Lumbar corset;Applied in sitting position   Pertinent Vitals/Pain Denies pain today    Mobility  Bed Mobility Bed Mobility: Rolling Right Rolling Right: 4: Min assist;With rail Right Sidelying to Sit: 4: Min assist Details for Bed Mobility Assistance: v/cs for technique and min facilitation for follow through with attention to back precautions Transfers Transfers: Sit to Stand;Stand to Sit Sit to Stand: 3: Mod assist;With upper extremity assist;From bed Stand to Sit: 3: Mod assist;With upper extremity assist;To bed Details for Transfer Assistance: sit<>stand x5 today needing several rest breaks following standing activities; cues for technique, facilitation for anterior translation of trunk over BOS and slow controlled movement expecially with sitting; pt tends to lock knees out and push on the backs of his legs to help stabilize Ambulation/Gait Ambulation/Gait Assistance: 3: Mod assist Ambulation Distance (Feet): 12 Feet Assistive  device: Rolling walker Ambulation/Gait Assistance Details: max step by step sequencing cues, facilitation for tall posture and engagement/control of core and LLE when weight bearing; control improved in LLE however it does still buckle, improved midline hips/trunk today; encouraged slow controlled movement  Gait Pattern: Trunk flexed;Step-through pattern;Decreased stride length General Gait Details: short but equal steps     Exercises General Exercises - Lower Extremity Long Arc Quad: AROM;Both;10 reps;Seated;AAROM;Limitations Long Arc Quad Limitations: difficulty maintaining flexed hip with LAQ on the left, assist to stabilize Hip Flexion/Marching: AROM;Both;10 reps;Seated;Limitations Hip Flexion/Marching Limitations: decreased control of LLE Mini-Sqauts: AAROM;Both;10 reps;Standing;Limitations Mini Squats Limitations: facilitation for controlled trunk and tall posture to minimize trunk flexion; focus on strengthening and control of lower extremities    PT Goals Acute Rehab PT Goals PT Goal: Rolling Supine to Right Side - Progress: Progressing toward goal PT Goal: Rolling Supine to Left Side - Progress: Progressing toward goal PT Goal: Supine/Side to Sit - Progress: Progressing toward goal PT Goal: Sit at Edge Of Bed - Progress: Progressing toward goal PT Goal: Sit to Stand - Progress: Progressing toward goal PT Goal: Stand to Sit - Progress: Progressing toward goal PT Goal: Ambulate - Progress: Progressing toward goal  Visit Information  Last PT Received On: 03/25/13 Assistance Needed: +2    Subjective Data  Subjective: I just hurt when I try to twist my back. Patient Stated Goal: stronger, ambulation, independent   Cognition  Cognition Arousal/Alertness: Awake/alert Behavior During Therapy: Impulsive Overall Cognitive Status: Within Functional Limits for tasks assessed General Comments: improved attention today with less stimulation and more closed environment today     Balance  Static Sitting Balance Static Sitting - Comment/# of Minutes: still falls posteriorly when participating in dynamic  activities (i.e. putting on his brace) however not losing complete control today and ability to self correct improving Static Standing Balance Static Standing - Balance Support: Bilateral upper extremity supported Static Standing - Level of Assistance: 4: Min assist Static Standing - Comment/# of Minutes: stood x3 for 30 seconds each with cueing and focus on engagement of LLE and tall posture; facilitation for knee/hip/trunk extension  End of Session PT - End of Session Equipment Utilized During Treatment: Gait belt;Back brace Activity Tolerance: Patient tolerated treatment well Patient left: in chair;with call bell/phone within reach Nurse Communication: Mobility status   GP     [facility] [name] 03/25/2013, 12:04 PM

## 2013-03-25 NOTE — H&P (Signed)
Physical Medicine and Rehabilitation Admission H&P  CC: Back pain with LE weakness and difficulty walking.  HPI: Anthony Walls is a 72 y.o. male with history of HTN, back pain who developed problems with mobility 7-10 days prior to admission. 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 Patient admitted on 03/20/13 for T9-T10 decompression with tumor removal and T9-T11 fusion by Dr. Gerlene Fee. Heme Onc consulted for input? Post op hypoxia with hypotension treated with IVF bolus. ABLA treated with PRBC and most recent with hgb at 7.9. Therapies initiated and patient continues to be limited by LLE instability due to weakness. PT, OT recommending CIR  Review of Systems  HENT: Negative for hearing loss.  Eyes: Negative for blurred vision and double vision.  Respiratory: Negative for cough and shortness of breath.  Cardiovascular: Negative for chest pain, palpitations and leg swelling.  Gastrointestinal: Positive for nausea, vomiting and constipation.  Feels bloated  Musculoskeletal: Positive for myalgias.  Neurological: Positive for sensory change (reports Numbness adominal area and LLE) and focal weakness. Negative for dizziness and headaches.  Psychiatric/Behavioral: The patient does not have insomnia.   Past Medical History   Diagnosis  Date   .  Hypertension    .  Arthritis    .  Hesitancy      ?BPH    Past Surgical History   Procedure  Laterality  Date   .  Brain surgery   98     tumor   .  Laminectomy  N/A  03/20/2013     Procedure: THORACIC LAMINECTOMY FOR TUMOR; Surgeon: Reinaldo Meeker, MD; Location: [facility]; Service: Neurosurgery; Laterality: N/A; Thoracic Laminectomy for Thoracic Ten Tumor, Pedicle Screws at Thoracic Nine through Eleven    Family History   Problem  Relation  Age of Onset   .  Diabetes  Mother     Social History: Married. Disabled since '98 ( due to gait problems past brain tumor). Wife works but on  [employer] X 3 months. He reports that he has never smoked. He does not have any smokeless tobacco history on file. He reports that he does not drink alcohol or use illicit drugs  Allergies: No Known Allergies  Medications Prior to Admission   Medication  Sig  Dispense  Refill   .  cholestyramine (QUESTRAN) 4 G packet  Take 1 packet by mouth daily.     .  cyclobenzaprine (FLEXERIL) 10 MG tablet  Take 10 mg by mouth 3 (three) times daily as needed for muscle spasms.     Marland Kitchen  etodolac (LODINE) 300 MG capsule  Take 300 mg by mouth every 4 (four) hours as needed (for inflammation).     Marland Kitchen  HYDROcodone-acetaminophen (NORCO/VICODIN) 5-325 MG per tablet  Take 1 tablet by mouth every 4 (four) hours as needed for pain.     Marland Kitchen  losartan (COZAAR) 50 MG tablet  Take 50 mg by mouth daily.     .  [EXPIRED] mupirocin ointment (BACTROBAN) 2 %  Apply topically 3 (three) times daily.     .  tamsulosin (FLOMAX) 0.4 MG CAPS  Take 0.4 mg by mouth daily.     .  [DISCONTINUED] dexamethasone (DECADRON) 4 MG tablet  Take 4 mg by mouth 4 (four) times daily.      Home:  Home Living  Lives With: Spouse  Available Help at Discharge: Family  Type of Home: House  Home Access: Stairs to enter  [employer]  of Steps: 3  Entrance Stairs-Rails: Left  Home Layout: One level  Bathroom Shower/Tub: Medical sales representative: Standard  Home Adaptive Equipment: Walker - standard;Straight cane  Functional History:  Prior Function  Able to Take Stairs?: Yes  Driving: Yes  Vocation: Retired  Comments: Has started using walker/cane within last 1-2 months.  Functional Status:  Mobility:  Bed Mobility  Bed Mobility: Rolling Right  Rolling Right: 4: Min assist;With rail  Rolling Left: 4: Min assist  Right Sidelying to Sit: 4: Min assist  Left Sidelying to Sit: 4: Min assist;HOB flat  Transfers  Transfers: Sit to Stand;Stand to Sit  Sit to Stand: 4: Min assist;With armrests;With upper extremity assist  Stand to Sit:  4: Min assist;With upper extremity assist;With armrests  Squat Pivot Transfers: 4: Min assist;With armrests  Ambulation/Gait  Ambulation/Gait Assistance: 3: Mod assist  Ambulation/Gait: Patient Percentage: 40%  Ambulation Distance (Feet): 12 Feet  Assistive device: Rolling walker  Ambulation/Gait Assistance Details: max step by step sequencing cues, facilitation for tall posture and engagement/control of core and LLE when weight bearing; control improved in LLE however it does still buckle, improved midline hips/trunk today; encouraged slow controlled movement  Gait Pattern: Trunk flexed;Step-through pattern;Decreased stride length  General Gait Details: short but equal steps   ADL:  ADL  Grooming: Performed;Wash/dry face;Set up;Wash/dry hands;Min guard  Where Assessed - Grooming: Supported standing  Upper Body Bathing: Simulated;Set up  Where Assessed - Upper Body Bathing: Supported sitting  Lower Body Bathing: Simulated;Moderate assistance  Where Assessed - Lower Body Bathing: Supported sit to stand  Upper Body Dressing: Simulated;Minimal assistance  Where Assessed - Upper Body Dressing: Supported sitting  Lower Body Dressing: Simulated;Maximal assistance  Where Assessed - Lower Body Dressing: Supported sit to Scientist, research (life sciences): Performed;Minimal Psychologist, clinical Method: Sit to Production manager: Regular height toilet;Grab bars  Equipment Used: Back brace;Rolling walker;Gait belt  Transfers/Ambulation Related to ADLs: inst. cues for hand placement on walker  ADL Comments: reviewed donning of back brace, pt. able to return demo proper positioning of brace while seated in chair with min a with velcro straps  Cognition:  Cognition  Overall Cognitive Status: Within Functional Limits for tasks assessed  Arousal/Alertness: Awake/alert  Orientation Level: Oriented X4  Cognition  Arousal/Alertness: Awake/alert  Behavior During Therapy: Impulsive  Overall  Cognitive Status: Within Functional Limits for tasks assessed  Area of Impairment: Safety/judgement  Safety/Judgement: Decreased awareness of deficits  General Comments: improved attention today with less stimulation and more closed environment today  Physical Exam:  Blood pressure 124/63, pulse 91, temperature 98.5 F (36.9 C), temperature source Oral, resp. rate 20, height 5\' 7"  (1.702 m), weight 95.1 kg (209 lb 10.5 oz), SpO2 100.00%.    Physical Exam  Nursing note and vitals reviewed.  Constitutional: He is oriented to person, place, and time. He appears well-developed and well-nourished.  HENT:  Head: Normocephalic and atraumatic.  Edentulous  Eyes: Pupils are equal, round, and reactive to light.  Neck: Normal range of motion.  Cardiovascular: Normal rate and regular rhythm. No murmurs Pulmonary/Chest: Effort normal and breath sounds normal. No wheezes or rales Abdominal: He exhibits distension. Bowel sounds are decreased. There is no tenderness.  Musculoskeletal: He exhibits no edema and no tenderness.  Neurological: He is alert and oriented to person, place, and time.  LLE weakness with ataxia. Decreased sensation LLE greater than RLE to propioception. Only mild LT/PP sensation loss.  Skin: Skin is warm and dry.  Back incision clean and dry with steri-strips in place.   No results found for this or any previous visit (from the past 48 hour(s)).  No results found.  Post Admission Physician Evaluation:  1. 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 2. Patient is admitted to receive collaborative, interdisciplinary care between the physiatrist, rehab nursing staff, and therapy team. 3. Patient's level of medical complexity and substantial therapy needs in context of that medical necessity cannot be provided at a lesser intensity of care such as a SNF. 4. Patient has experienced substantial functional loss from  his/her baseline which was documented above under the "Functional History" and "Functional Status" headings. Judging by the patient's diagnosis, physical exam, and functional history, the patient has potential for functional progress which will result in measurable gains while on inpatient rehab. These gains will be of substantial and practical use upon discharge in facilitating mobility and self-care at the household level. 5. Physiatrist will provide 24 hour management of medical needs as well as oversight of the therapy plan/treatment and provide guidance as appropriate regarding the interaction of the two. 6. 24 hour rehab nursing will assist with bladder management, bowel management, safety, skin/wound care, disease management, medication administration, pain management and patient education and help integrate therapy concepts, techniques,education, etc. 7. PT will assess and treat for/with: Lower extremity strength, range of motion, stamina, balance, functional mobility, safety, adaptive techniques and equipment, NMR, brace don/dof. Goals are: mod I with basic mobility. 8. OT will assess and treat for/with: ADL's, functional mobility, safety, upper extremity strength, adaptive techniques and equipment, NMR, brace donning and doffing, pain control. Goals are: mod I to supervision with self-care. May need some assist with application of brace. 9. SLP will assess and treat for/with: n/a. Goals are: n/a. 10. Case Management and Social Worker will assess and treat for psychological issues and discharge planning. 11. Team conference will be held weekly to assess progress toward goals and to determine barriers to discharge. 12. Patient will receive at least 3 hours of therapy per day at least 5 days per week. 13. ELOS: 7-10 days Prognosis: excellent Medical Problem List and Plan:  1. DVT Prophylaxis/Anticoagulation: Mechanical: Antiembolism stockings, knee (TED hose) Bilateral lower extremities   Sequential compression devices, below knee Bilateral lower extremities  2. OA/ Pain Management: prn medications effective.  3. Mood: Motivated to try hard and get better.  4. Neuropsych: This patient is capable of making decisions on his/her own behalf.  5. HTN: Monitor with bid checks. Continue cozaar. Encourage po fluid intake.  6. ABLA: will add iron supplement.  7.Constipation: Has had constipation issues for a week PTA and no BM since admission. SSE today. Schedule miralax.  8. BPH?: history of hesitancy. Continue flomax. Will discontinue foley in am. Check UA/UCS   Ranelle Oyster, MD, [facility]  03/25/2013

## 2013-03-25 NOTE — Progress Notes (Signed)
Patient admitted at 27. Patient received from 4N unit . Patient alert and oriented x 4  . No complaint of pain . Patient and wife verbalized understanding of admission  Process.                           Anthony Walls

## 2013-03-25 NOTE — Discharge Summary (Signed)
  Physician Discharge Summary  Patient ID: Anthony Walls MRN: [id number] DOB/AGE: 08-May-1941 72 y.o.  Admit date: 03/20/2013 Discharge date: 03/25/2013  Admission Diagnoses:  Discharge Diagnoses:  Active Problems:   * No active hospital problems. *   Discharged Condition: good  Hospital Course: Surgery last Thursday for plasmacytoma of T10. Did well. Much better post op. Increased activity with therapy. CIR recdomended, and ready for ttransfer to rehab unit on POD 5. Wound healing well at that point. Strength increasing as well.Specific instructions given.  Consults: rehabilitation medicine  Significant Diagnostic Studies: none  Treatments: surgery: T9-T11 fusion, with decompression for plasmcytoma  Discharge Exam: Blood pressure 124/63, pulse 91, temperature 98.5 F (36.9 C), temperature source Oral, resp. rate 20, height 5\' 7"  (1.702 m), weight 95.1 kg (209 lb 10.5 oz), SpO2 100.00%. Incision/Wound:healing well. Strength slowly increasing.  Disposition: Final discharge disposition not confirmed     Medication List    STOP taking these medications       dexamethasone 4 MG tablet  Commonly known as:  DECADRON      TAKE these medications       cholestyramine 4 G packet  Commonly known as:  QUESTRAN  Take 1 packet by mouth daily.     cyclobenzaprine 10 MG tablet  Commonly known as:  FLEXERIL  Take 10 mg by mouth 3 (three) times daily as needed for muscle spasms.     etodolac 300 MG capsule  Commonly known as:  LODINE  Take 300 mg by mouth every 4 (four) hours as needed (for inflammation).     HYDROcodone-acetaminophen 5-325 MG per tablet  Commonly known as:  NORCO/VICODIN  Take 1 tablet by mouth every 4 (four) hours as needed for pain.     losartan 50 MG tablet  Commonly known as:  COZAAR  Take 50 mg by mouth daily.     tamsulosin 0.4 MG Caps  Commonly known as:  FLOMAX  Take 0.4 mg by mouth daily.      ASK your doctor about these medications       mupirocin ointment 2 %  Commonly known as:  BACTROBAN  Apply topically 3 (three) times daily.         At home rest most of the time. Get up 9 or 10 times each day and take a 15 or 20 minute walk. No riding in the car and to your first postoperative appointment. If you have neck surgery you may shower from the chest down starting on the third postoperative day. If you had back surgery he may start showering on the third postoperative day with saran wrap wrapped around your incisional area 3 times. After the shower remove the saran wrap. Take pain medicine as needed and other medications as instructed. Call my office for an appointment.  Signed: Reinaldo Meeker, MD 03/25/2013, 1:58 PM

## 2013-03-25 NOTE — Progress Notes (Signed)
Rehab admissions - Evaluated for possible admission.  I met with patient and his wife.  They would like inpatient rehab prior to home.  I do have a bed available today.  If attending agrees, can potentially admit to acute inpatient rehab.  Call me for questions.  #[phone]

## 2013-03-25 NOTE — Progress Notes (Signed)
Occupational Therapy Treatment Patient Details Name: Anthony Walls MRN: [id number] DOB: Feb 07, 1941 Today's Date: 03/25/2013 Time: 4098-1191 OT Time Calculation (min): 21 min  OT Assessment / Plan / Recommendation Comments on Treatment Session      Follow Up Recommendations  CIR    Barriers to Discharge       Equipment Recommendations       Recommendations for Other Services    Frequency Min 2X/week   Plan      Precautions / Restrictions Precautions Precautions: Back;Fall Precaution Comments: reviewed 3/3 back precautions as patient unable to recall any Required Braces or Orthoses: Spinal Brace Spinal Brace: Lumbar corset;Applied in sitting position   Pertinent Vitals/Pain No c/o pain    ADL  Toilet Transfer: Performed;Minimal Dentist Method: Sit to Barista: Regular height toilet;Grab bars Toileting - Clothing Manipulation and Hygiene: Performed;Minimal assistance Where Assessed - Engineer, mining and Hygiene: Sit on 3-in-1 or toilet Transfers/Ambulation Related to ADLs: inst. cues for hand placement on walker ADL Comments: reviewed donning of back brace, pt. able to return demo proper positioning of brace while seated in chair with min a with velcro straps    OT Diagnosis:    OT Problem List:   OT Treatment Interventions:     OT Goals ADL Goals ADL Goal: Toilet Transfer - Progress: Progressing toward goals ADL Goal: Toileting - Clothing Manipulation - Progress: Progressing toward goals  Visit Information  Last OT Received On: 03/25/13 Assistance Needed: +2    Subjective Data  Subjective: "i need to get to the bathroom"   Prior Functioning       Cognition  Cognition Arousal/Alertness: Awake/alert Behavior During Therapy: Impulsive Overall Cognitive Status: Within Functional Limits for tasks assessed General Comments: improved attention today with less stimulation and more closed environment today     Mobility  Bed Mobility Bed Mobility: Rolling Right Rolling Right: 4: Min assist;With rail Right Sidelying to Sit: 4: Min assist Details for Bed Mobility Assistance: v/cs for technique and min facilitation for follow through with attention to back precautions Transfers Transfers: Sit to Stand;Stand to Sit Sit to Stand: 4: Min assist;With armrests;With upper extremity assist Stand to Sit: 4: Min assist;With upper extremity assist;With armrests Details for Transfer Assistance: sit<>stand x5 today needing several rest breaks following standing activities; cues for technique, facilitation for anterior translation of trunk over BOS and slow controlled movement expecially with sitting; pt tends to lock knees out and push on the backs of his legs to help stabilize    Exercises  General Exercises - Lower Extremity Long Arc Quad: AROM;Both;10 reps;Seated;AAROM;Limitations Long Arc Quad Limitations: difficulty maintaining flexed hip with LAQ on the left, assist to stabilize Hip Flexion/Marching: AROM;Both;10 reps;Seated;Limitations Hip Flexion/Marching Limitations: decreased control of LLE Mini-Sqauts: AAROM;Both;10 reps;Standing;Limitations Mini Squats Limitations: facilitation for controlled trunk and tall posture to minimize trunk flexion; focus on strengthening and control of lower extremities   Balance Static Sitting Balance Static Sitting - Comment/# of Minutes: still falls posteriorly when participating in dynamic activities (i.e. putting on his brace) however not losing complete control today and ability to self correct improving Static Standing Balance Static Standing - Balance Support: Bilateral upper extremity supported Static Standing - Level of Assistance: 4: Min assist Static Standing - Comment/# of Minutes: stood x3 for 30 seconds each with cueing and focus on engagement of LLE and tall posture; facilitation for knee/hip/trunk extension   End of Session OT - End of  Session Equipment Utilized During Treatment: Back brace  Activity Tolerance: Patient tolerated treatment well Patient left: in chair;with call bell/phone within reach;with family/visitor present  GO     Anthony Walls 03/25/2013, 2:37 PM

## 2013-03-26 ENCOUNTER — Inpatient Hospital Stay (HOSPITAL_COMMUNITY): Payer: Medicare Other | Admitting: Physical Therapy

## 2013-03-26 ENCOUNTER — Inpatient Hospital Stay (HOSPITAL_COMMUNITY): Payer: Medicare Other | Admitting: Occupational Therapy

## 2013-03-26 DIAGNOSIS — D62 Acute posthemorrhagic anemia: Secondary | ICD-10-CM | POA: Diagnosis present

## 2013-03-26 DIAGNOSIS — N319 Neuromuscular dysfunction of bladder, unspecified: Secondary | ICD-10-CM

## 2013-03-26 DIAGNOSIS — G9520 Unspecified cord compression: Secondary | ICD-10-CM | POA: Diagnosis present

## 2013-03-26 DIAGNOSIS — D492 Neoplasm of unspecified behavior of bone, soft tissue, and skin: Secondary | ICD-10-CM

## 2013-03-26 DIAGNOSIS — C720 Malignant neoplasm of spinal cord: Secondary | ICD-10-CM

## 2013-03-26 DIAGNOSIS — G8220 Paraplegia, unspecified: Secondary | ICD-10-CM

## 2013-03-26 DIAGNOSIS — K592 Neurogenic bowel, not elsewhere classified: Secondary | ICD-10-CM

## 2013-03-26 DIAGNOSIS — I10 Essential (primary) hypertension: Secondary | ICD-10-CM | POA: Diagnosis present

## 2013-03-26 DIAGNOSIS — N289 Disorder of kidney and ureter, unspecified: Secondary | ICD-10-CM | POA: Diagnosis present

## 2013-03-26 HISTORY — DX: Neoplasm of unspecified behavior of bone, soft tissue, and skin: D49.2

## 2013-03-26 LAB — COMPREHENSIVE METABOLIC PANEL
AST: 23 U/L (ref 0–37)
BUN: 33 mg/dL — ABNORMAL HIGH (ref 6–23)
CO2: 29 mEq/L (ref 19–32)
Chloride: 101 mEq/L (ref 96–112)
Creatinine, Ser: 1.48 mg/dL — ABNORMAL HIGH (ref 0.50–1.35)
GFR calc non Af Amer: 46 mL/min — ABNORMAL LOW (ref >90)
Total Bilirubin: 0.2 mg/dL — ABNORMAL LOW (ref 0.3–1.2)

## 2013-03-26 LAB — CBC WITH DIFFERENTIAL/PLATELET
HCT: 22.9 % — ABNORMAL LOW (ref 39.0–52.0)
Hemoglobin: 7.6 g/dL — ABNORMAL LOW (ref 13.0–17.0)
Lymphocytes Relative: 26 % (ref 12–46)
Lymphs Abs: 2.3 10*3/uL (ref 0.7–4.0)
Monocytes Absolute: 1.2 10*3/uL — ABNORMAL HIGH (ref 0.1–1.0)
Monocytes Relative: 14 % — ABNORMAL HIGH (ref 3–12)
Neutro Abs: 5 10*3/uL (ref 1.7–7.7)
WBC: 8.6 10*3/uL (ref 4.0–10.5)

## 2013-03-26 MED ORDER — LIDOCAINE HCL 2 % EX GEL
CUTANEOUS | Status: DC | PRN
  Filled 2013-03-26 (×2): qty 5

## 2013-03-26 MED ORDER — FLEET ENEMA 7-19 GM/118ML RE ENEM
1.0000 | ENEMA | Freq: Every day | RECTAL | Status: DC | PRN
  Administered 2013-03-26: 1 via RECTAL
  Filled 2013-03-26 (×2): qty 1

## 2013-03-26 NOTE — Progress Notes (Signed)
Occupational Therapy Session Note  Patient Details  Name: Anthony Walls MRN: [id number] Date of Birth: Mar 24, 1941  Today's Date: 03/26/2013 Time: 4098-1191 Time Calculation (min): 23 min  Short Term Goals: Week 1:  OT Short Term Goal 1 (Week 1): Short Term Goals=Long Term Goals  Skilled Therapeutic Interventions/Progress Updates:    1:1 Focus on functional ambulation with RW around in room with min A, toliet transfers with min A with RW and min cuing for hand placement. Pt had to be cathed for filled bladder by RN missed 22 min.  Therapy Documentation Precautions:  Precautions Precautions: Fall;Back Precaution Comments: reviewed 3/3 back precautions as patient unable to recall any again Required Braces or Orthoses: Spinal Brace Spinal Brace: Lumbar corset;Applied in sitting position Restrictions Weight Bearing Restrictions: No General: General Amount of Missed OT Time (min): 22 Minutes    Pain: Pain Assessment Pain Assessment: No/denies pain  See FIM for current functional status  Therapy/Group: Individual Therapy  Roney Mans [facility] 03/26/2013, 3:30 PM

## 2013-03-26 NOTE — Evaluation (Signed)
Physical Therapy Assessment and Plan  Patient Details  Name: Anthony Walls MRN: [id number] Date of Birth: 1941-05-17  PT Diagnosis: Abnormality of gait, Difficulty walking and Muscle weakness Rehab Potential: Good ELOS: 10-14 days   Today's Date: 03/26/2013 Time: 0730-0828 Time Calculation (min): 58 min  Problem List: There is no problem list on file for this patient.   Past Medical History:  Past Medical History  Diagnosis Date  . Hypertension   . Arthritis   . Hesitancy     ?BPH   Past Surgical History:  Past Surgical History  Procedure Laterality Date  . Brain surgery  98    tumor  . Laminectomy N/A 03/20/2013    Procedure: THORACIC LAMINECTOMY FOR TUMOR;  Surgeon: Reinaldo Meeker, MD;  Location: [facility];  Service: Neurosurgery;  Laterality: N/A;  Thoracic Laminectomy for Thoracic Ten Tumor, Pedicle Screws at Thoracic Nine through Eleven    Assessment & Plan Clinical Impression: Anthony Walls is a 72 y.o. male with history of HTN, back pain who developed problems with mobility 7-10 days prior to admission. 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 Patient admitted on 03/20/13 for T9-T10 decompression with tumor removal and T9-T11 fusion by Dr. Gerlene Fee. Heme Onc consulted for input? Post op hypoxia with hypotension treated with IVF bolus. ABLA treated with PRBC and most recent with hgb at 7.9.  Patient transferred to CIR on 03/25/2013 .   Patient currently requires mod with mobility secondary to muscle weakness and muscle paralysis, decreased cardiorespiratoy endurance, impaired timing and sequencing, unbalanced muscle activation and decreased coordination and decreased standing balance and decreased balance strategies and decreased ability to maintain precautions. Pt currently most limited by lack of endurance and bil. Knee control (hyperextends/buckles) with standing activity and gait placing him at high risk  for falls and joint degradation. Prior to hospitalization, patient was independent  with mobility and lived with Spouse in a House home.  Home access is 3Stairs to enter.  Patient will benefit from skilled PT intervention to maximize safe functional mobility, minimize fall risk and decrease caregiver burden for planned discharge home with supervision when pt ambulatory.  Anticipate patient will benefit from follow up HH at discharge.  PT - End of Session Endurance Deficit: Yes PT Assessment Rehab Potential: Good Barriers to Discharge: None PT Plan PT Intensity: Minimum of 1-2 x/day ,45 to 90 minutes PT Frequency: 5 out of 7 days PT Duration Estimated Length of Stay: 10-14 days PT Treatment/Interventions: Ambulation/gait training;Balance/vestibular training;Community reintegration;Discharge planning;Patient/family education;Therapeutic Activities;Therapeutic Exercise;Pain management;Neuromuscular re-education;Stair training;Functional mobility training;Psychosocial support;DME/adaptive equipment instruction;UE/LE Strength taining/ROM;UE/LE Coordination activities;Wheelchair propulsion/positioning PT Recommendation Follow Up Recommendations: Home health PT Patient destination: Home Equipment Recommended: Rolling walker with 5" wheels  Skilled Therapeutic Intervention  Pt continued to work on ambulation after evaluation  with RW and moderate assist, with fatigue pt demonstrates exponentially greater bil. Knee hyperextension and knee buckling. Pt becomes heavily reliant on bil. UEs. Practiced stand pivot transfer to Rt/Lt. with RW and cues and demonstration for technique, safety, and maintaining back precautions. Sit <> stands from mat + mini-squats with min/mod assist. Pt limited by fatigue.  PT Evaluation Precautions/Restrictions  Corset brace donned in sitting. Back precautions, verbally reviewed as pt unable to recall.  General   Vital Signs  Pain Pain Assessment Pain Assessment:  No/denies pain Pain Score: 0-No pain Faces Pain Scale: No hurt Home Living/Prior Functioning Home Living Lives With: Spouse Available Help at Discharge: Family;Available 24 hours/day  Type of Home: House Bathroom Shower/Tub: Tub/shower unit;Door Bathroom Accessibility: Yes How Accessible: Accessible via walker (may have to walk in sideways) Home Adaptive Equipment: Shower chair with back;Walker - standard;Straight cane Vision/Perception  Vision - History Baseline Vision: Wears glasses only for reading (and driving) Patient Visual Report: No change from baseline Vision - Assessment Eye Alignment: Within Functional Limits Perception Perception: Within Functional Limits Praxis Praxis: Intact  Cognition Overall Cognitive Status: Within Functional Limits for tasks assessed Arousal/Alertness: Awake/alert Orientation Level: Oriented X4 Memory: Appears intact Awareness: Appears intact Problem Solving: Appears intact Safety/Judgment: Appears intact Sensation Sensation Light Touch: Appears Intact Stereognosis: Appears Intact (BUEs) Hot/Cold: Appears Intact (BUEs) Proprioception: Impaired Detail Proprioception Impaired Details: Impaired RLE;Impaired LLE (during functional activity) Additional Comments: Pt reports sometimes legs feel like they "go to sleep" but then they "wake up" Coordination Gross Motor Movements are Fluid and Coordinated: Yes Fine Motor Movements are Fluid and Coordinated: Yes  Mobility Bed Mobility Rolling Left: 4: Min assist Left Sidelying to Sit: 3: Mod assist Left Sidelying to Sit Details (indicate cue type and reason): Cues for log roll technique, pt needs assist through trunk and verbal cues to maintain precautions during transition.  Transfers Sit to Stand: 4: Min assist Sit to Stand Details (indicate cue type and reason): Verbal cues for bil. UE placement for safety.  Stand to Sit: 4: Min assist;3: Mod assist Squat Pivot Transfers: 3: Mod  assist Squat Pivot Transfer Details (indicate cue type and reason): up to moderate assist secondary to bil. LE weakness and evidence of bucling/hyperextension.  Locomotion  Ambulation Ambulation: Yes Ambulation/Gait Assistance: 3: Mod assist Ambulation Distance (Feet): 12 Feet (+ 8') Assistive device: Rolling walker Ambulation/Gait Assistance Details: Step by step sequencing cues and cues for upright posture. Pt demonstrates increasing incidence of buckling alternating with hyperextension of the knee with fatigue, is unable to correct with verbal or tactile cues.  Gait Gait: Yes Gait Pattern: Step-to pattern;Decreased stride length;Trunk flexed Stairs / Additional Locomotion Stairs: Yes Stairs Assistance: 1: +2 Total assist Stairs Assistance Details: Verbal cues for sequencing;Verbal cues for technique Stairs Assistance Details (indicate cue type and reason): +2 assist for safety given BLE buckling, overall pt needed moderate assist. Stair Management Technique: Two rails;Step to pattern;Forwards Number of Stairs: 2 Corporate treasurer: Yes Wheelchair Assistance: 4: Administrator, sports Details: Tactile cues for sequencing;Verbal cues for technique;Verbal cues for sequencing;Verbal cues for safe use of DME/AE Wheelchair Propulsion: Both upper extremities Wheelchair Parts Management: Needs assistance Distance: 175'   Balance Balance Balance Assessed: Yes Static Sitting Balance Static Sitting - Balance Support: No upper extremity supported;Feet supported Static Sitting - Level of Assistance: 5: Stand by assistance Static Standing Balance Static Standing - Balance Support: Bilateral upper extremity supported Static Standing - Level of Assistance: 4: Min assist Dynamic Standing Balance Dynamic Standing - Balance Support: Bilateral upper extremity supported Dynamic Standing - Level of Assistance: 3: Mod assist Dynamic Standing - Comments: Heavily  reliant on BUEs Extremity Assessment  RUE Assessment RUE Assessment: Exceptions to [facility] RUE AROM (degrees) RUE Overall AROM Comments: Patient with limited shoudler AROM, patients states pain during shoulder flexion and shoulder abduction RUE Strength RUE Overall Strength Comments: Shoulder strenth grossly 3/5 (patient states pain during MMT), elbow, forearm, wrist strength =4+/5 LUE Assessment LUE Assessment: Within Functional Limits (can benefit from strengthening exercises) RLE Assessment RLE Assessment: Exceptions to [facility] RLE AROM (degrees) RLE Overall AROM Comments: WFL RLE Strength Right Hip Flexion: 3-/5 Right Knee Flexion: 3-/5 Right Knee  Extension: 3/5 Right Ankle Dorsiflexion: 3+/5 LLE Assessment LLE Assessment: Exceptions to WFL LLE AROM (degrees) LLE Overall AROM Comments: WFL LLE Strength Left Hip Flexion: 3-/5 Left Knee Flexion: 3-/5 Left Knee Extension: 3/5 Left Ankle Dorsiflexion: 3+/5  FIM:  FIM - Bed/Chair Transfer Bed/Chair Transfer: 3: Supine > Sit: Mod A (lifting assist/Pt. 50-74%/lift 2 legs;3: Bed > Chair or W/C: Mod A (lift or lower assist);3: Chair or W/C > Bed: Mod A (lift or lower assist) FIM - Locomotion: Wheelchair Distance: 175' Locomotion: Wheelchair: 4: Travels 150 ft or more: maneuvers on rugs and over door sillls with minimal assistance (Pt.>75%) FIM - Locomotion: Ambulation Locomotion: Ambulation Assistive Devices: Designer, industrial/product Ambulation/Gait Assistance: 3: Mod assist Locomotion: Ambulation: 1: Travels less than 50 ft with moderate assistance (Pt: 50 - 74%) FIM - Locomotion: Stairs Locomotion: Building control surveyor: Radio broadcast assistant - 2 Locomotion: Stairs: 1: Two helpers   Refer to [employer] for Long Term Goals  Recommendations for other services: None  Discharge Criteria: Patient will be discharged from PT if patient refuses treatment 3 consecutive times without medical reason, if treatment goals not met, if there is a change in medical  status, if patient makes no progress towards goals or if patient is discharged from hospital.  The above assessment, treatment plan, treatment alternatives and goals were discussed and mutually agreed upon: by patient  Wilhemina Bonito 03/26/2013, 12:05 PM

## 2013-03-26 NOTE — Evaluation (Signed)
Occupational Therapy Assessment and Plan & Session Note  Patient Details  Name: Anthony Walls MRN: [id number] Date of Birth: 1941/04/08  OT Diagnosis: abnormal posture, acute pain, lumbago (low back pain) and muscle weakness (generalized) Rehab Potential: Rehab Potential: Good ELOS: 7-10 days   Today's Date: 03/26/2013  Problem List: There is no problem list on file for this patient.   Past Medical History:  Past Medical History  Diagnosis Date  . Hypertension   . Arthritis   . Hesitancy     ?BPH   Past Surgical History:  Past Surgical History  Procedure Laterality Date  . Brain surgery  98    tumor  . Laminectomy N/A 03/20/2013    Procedure: THORACIC LAMINECTOMY FOR TUMOR;  Surgeon: Reinaldo Meeker, MD;  Location: [facility];  Service: Neurosurgery;  Laterality: N/A;  Thoracic Laminectomy for Thoracic Ten Tumor, Pedicle Screws at Thoracic Nine through Eleven    Clinical Impression: Anthony Walls is a 72 y.o. male with history of HTN, back pain who developed problems with mobility 7-10 days prior to admission. 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 Patient admitted on 03/20/13 for T9-T10 decompression with tumor removal and T9-T11 fusion by Dr. Gerlene Fee. Heme Onc consulted for input? Post op hypoxia with hypotension treated with IVF bolus. ABLA treated with PRBC and most recent with hgb at 7.9. Therapies initiated and patient continues to be limited by LLE instability due to weakness. PT, OT recommending CIR. Patient transferred to CIR on 03/25/2013 .    Patient currently requires supervision-total assist with basic self-care skills and IADL secondary to muscle weakness and muscle joint tightness and decreased sitting balance, decreased standing balance, decreased postural control, decreased balance strategies and difficulty maintaining precautions.  Prior to hospitalization, patient could complete ADLs & IADLs  independently, patient was driving PTA.   Patient will benefit from skilled intervention to increase independence with basic self-care skills prior to discharge home with care partner.  Anticipate patient will require 24 hour supervision and follow up home health.  OT - End of Session Activity Tolerance: Tolerates 10 - 20 min activity with multiple rests Endurance Deficit: Yes OT Assessment Rehab Potential: Good Barriers to Discharge: None (none known at this time) OT Plan OT Intensity: Minimum of 1-2 x/day, 45 to 90 minutes OT Frequency: 5 out of 7 days OT Duration/Estimated Length of Stay: 7-10 days OT Treatment/Interventions: Balance/vestibular training;Community reintegration;Discharge planning;DME/adaptive equipment instruction;Disease mangement/prevention;Functional mobility training;Neuromuscular re-education;Pain management;Patient/family education;Psychosocial support;Self Care/advanced ADL retraining;Skin care/wound managment;Therapeutic Activities;Therapeutic Exercise;UE/LE Strength taining/ROM;Splinting/orthotics;Wheelchair propulsion/positioning;UE/LE Coordination activities OT Recommendation Patient destination: Home Follow Up Recommendations: Home health OT Equipment Recommended: 3 in 1 bedside comode;Tub/shower seat   Precautions/Restrictions  Precautions Precautions: Fall;Back Precaution Comments: reviewed 3/3 back precautions as patient unable to recall any again Required Braces or Orthoses: Spinal Brace Spinal Brace: Lumbar corset;Applied in sitting position Restrictions Weight Bearing Restrictions: No  General Chart Reviewed: Yes Family/Caregiver Present: Yes (Wife, Rosa)  Vital Signs Therapy Vitals BP: 139/71 mmHg  Pain Pain Assessment Pain Assessment: No/denies pain Pain Score: 0-No pain Faces Pain Scale: No hurt  Home Living/Prior Functioning Home Living Lives With: Spouse Available Help at Discharge: Family;Available 24 hours/day Type of Home:  House Home Access: Stairs to enter [employer] of Steps: 3 Entrance Stairs-Rails: Left;Right Home Layout: One level Bathroom Shower/Tub: Tub/shower unit;Door [employer] Toilet: Standard Bathroom Accessibility: Yes How Accessible: Accessible via walker (may have to walk in sideways) Home Adaptive Equipment: Shower chair with  back;Walker - standard;Straight cane IADL History Homemaking Responsibilities: Yes Meal Prep Responsibility: Secondary Laundry Responsibility: Primary Cleaning Responsibility: Secondary Bill Paying/Finance Responsibility: Secondary Shopping Responsibility: Secondary Child Care Responsibility: No Occupation: On disability Prior Function Level of Independence: Independent with gait;Independent with basic ADLs (prior to recent decline from tumor) Able to Take Stairs?: Yes Driving: Yes Vocation: On disability Vocation Requirements: disability due to prior tumor Leisure: Hobbies-yes (Comment) Comments: Loves grocery shopping, picking up grand kids from day care.  ADL - See FIM  Vision/Perception  Vision - History Baseline Vision: Wears glasses only for reading (and driving) Patient Visual Report: No change from baseline Vision - Assessment Eye Alignment: Within Functional Limits Perception Perception: Within Functional Limits Praxis Praxis: Intact   Cognition Overall Cognitive Status: Within Functional Limits for tasks assessed Arousal/Alertness: Awake/alert Orientation Level: Oriented X4 Memory: Appears intact Awareness: Appears intact Problem Solving: Appears intact Safety/Judgment: Appears intact  Sensation Sensation Light Touch: Appears Intact (BUEs) Stereognosis: Appears Intact (BUEs) Hot/Cold: Appears Intact (BUEs) Proprioception: Appears Intact (BUEs) Additional Comments: Pt reports sometimes legs feel like they "go to sleep" but then they "wake up" Coordination Gross Motor Movements are Fluid and Coordinated: Yes Fine Motor  Movements are Fluid and Coordinated: Yes  Motor - See Evaluation Navigator  Mobility - See Evaluation Navigator  Trunk/Postural Assessment - See Evaluation Navigator  Balance- See Evaluation Navigator  Extremity/Trunk Assessment RUE Assessment RUE Assessment: Exceptions to [facility] RUE AROM (degrees) RUE Overall AROM Comments: Patient with limited shoudler AROM, patients states pain during shoulder flexion and shoulder abduction RUE Strength RUE Overall Strength Comments: Shoulder strenth grossly 3/5 (patient states pain during MMT), elbow, forearm, wrist strength =4+/5 LUE Assessment LUE Assessment: Within Functional Limits (can benefit from strengthening exercises)  FIM:  FIM - Grooming Grooming Steps: Wash, rinse, dry face;Wash, rinse, dry hands;Oral care, brush teeth, clean dentures;Shave or apply make-up Grooming: 4: Patient completes 3 of 4 or 4 of 5 steps (min assist to shave) FIM - Bathing Bathing Steps Patient Completed: Chest;Right Arm;Left Arm;Abdomen;Front perineal area;Right upper leg;Left upper leg Bathing: 3: Mod-Patient completes 5-7 42f 10 parts or 50-74% FIM - Upper Body Dressing/Undressing Upper body dressing/undressing steps patient completed: Thread/unthread right sleeve of pullover shirt/dresss;Thread/unthread left sleeve of pullover shirt/dress;Put head through opening of pull over shirt/dress;Pull shirt over trunk Upper body dressing/undressing: 5: Set-up assist to: Obtain clothing/put away FIM - Lower Body Dressing/Undressing Lower body dressing/undressing: 1: Total-Patient completed less than 25% of tasks FIM - Toileting Toileting: 0: Activity did not occur FIM - Archivist Transfers: 0-Activity did not occur FIM - Secretary/administrator Devices: Shower chair;Grab bars;Walker Tub/shower Transfers: 3-Into Tub/Shower: Mod A (lift or lower/lift 2 legs);3-Out of Tub/Shower: Mod A (lift or lower/lift 2 legs)   Refer to Care Plan  for Long Term Goals  Recommendations for other services: None at this time.  Discharge Criteria: Patient will be discharged from OT if patient refuses treatment 3 consecutive times without medical reason, if treatment goals not met, if there is a change in medical status, if patient makes no progress towards goals or if patient is discharged from hospital.  The above assessment, treatment plan, treatment alternatives and goals were discussed and mutually agreed upon: by patient  -------------------------------------------------------------------------------------------------------------------   SESSION NOTE 0830-0930 - 60 Minutes Individual Therapy No complaints of pain Initial 1:1 occupational therapy evaluation completed. Focused skilled intervention on functional ambulation/mobility using rolling walker, shower stall transfer, UB/LB bathing at shower level, UB/LB dressing, doffing/donning of lumbar corset, back precautions  during functional tasks, grooming tasks seated at sink in w/c, and overall activity tolerance/endurance. Patient unable to recall back precautions and patient unable to adhere to back precautions even with verbal cues. At end of session left patient seated in w/c beside bed with call bell & phone within reach.   Addison Whidbee 03/26/2013, 9:42 AM

## 2013-03-26 NOTE — Progress Notes (Signed)
Subjective/Complaints: Moved bowels yesterday ( had some constipation prior). Good night overall.  A 12 point review of systems has been performed and if not noted above is otherwise negative.   Objective: Vital Signs: Blood pressure 129/72, pulse 68, temperature 99.5 F (37.5 C), temperature source Oral, resp. rate 18, height 5\' 4"  (1.626 m), weight 94.3 kg (207 lb 14.3 oz), SpO2 96.00%. No results found.  Recent Labs  03/26/13 0627  WBC 8.6  HGB 7.6*  HCT 22.9*  PLT 265    Recent Labs  03/26/13 0627  NA 134*  K 4.9  CL 101  GLUCOSE 118*  BUN 33*  CREATININE 1.48*  CALCIUM 8.6   CBG (last 3)   Recent Labs  03/23/13 1451  GLUCAP 110*    Wt Readings from Last 3 Encounters:  03/25/13 94.3 kg (207 lb 14.3 oz)  03/20/13 95.1 kg (209 lb 10.5 oz)  03/20/13 95.1 kg (209 lb 10.5 oz)    Physical Exam:  HENT:  Head: Normocephalic and atraumatic.  Edentulous  Eyes: Pupils are equal, round, and reactive to light.  Neck: Normal range of motion.  Cardiovascular: Normal rate and regular rhythm. No murmurs  Pulmonary/Chest: Effort normal and breath sounds normal. No wheezes or rales  Abdominal: He exhibits distension. Bowel sounds are present There is no tenderness.  Musculoskeletal: He exhibits no edema and no tenderness.  Neurological: He is alert and oriented to person, place, and time.  LLE weakness with ataxia. Decreased sensation LLE greater than RLE to propioception. Only mild LT/PP sensation loss. LE strength 2/5 prox to 3/5 distally  Skin: Skin is warm and dry.  Back incision clean and dry with steri-strips in place.    Assessment/Plan: 1. 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 Physiatrist is providing close team supervision and 24 hour management of active medical problems listed below. Physiatrist and  rehab team continue to assess barriers to discharge/monitor patient progress toward functional and medical goals. FIM:                   Comprehension Comprehension Mode: Auditory Comprehension: 6-Follows complex conversation/direction: With extra time/assistive device  Expression Expression Mode: Verbal Expression: 6-Expresses complex ideas: With extra time/assistive device  Social Interaction Social Interaction: 6-Interacts appropriately with others with medication or extra time (anti-anxiety, antidepressant).  Problem Solving Problem Solving: 6-Solves complex problems: With extra time  Memory Memory: 5-Requires cues to use assistive device  Medical Problem List and Plan:  1. DVT Prophylaxis/Anticoagulation: Mechanical: Antiembolism stockings, knee (TED hose) Bilateral lower extremities  Sequential compression devices, below knee Bilateral lower extremities  2. OA/ Pain Management: prn medications effective.  3. Mood: Motivated to get better.  4. Neuropsych: This patient is capable of making decisions on his/her own behalf.  5. HTN: Monitor with bid checks. Continue cozaar. Encourage po fluid intake.  6. ABLA: will add iron supplement.  7.Constipation: results with SSE yesterday. Daily regimen with miralax. Consider addition of senokot-s qhs 8. BPH?: history of hesitancy. Continue flomax. UA unremarkable, ucx pending. Follow pvr's  LOS (Days) 1 A FACE TO FACE EVALUATION WAS PERFORMED  Sharmane Dame T 03/26/2013 8:01 AM

## 2013-03-26 NOTE — Progress Notes (Signed)
Physical Therapy Session Note  Patient Details  Name: Anthony Walls MRN: [id number] Date of Birth: 1941/10/09  Today's Date: 03/26/2013 Time: 1430-1500 Time Calculation (min): 30 min  Short Term Goals: Week 1:  PT Short Term Goal 1 (Week 1): Pt will ambulate x 25' with min-guard assist and LRAD PT Short Term Goal 2 (Week 1): Pt will transfer bed <> wheelchair with supervision PT Short Term Goal 3 (Week 1): Pt will tolerate 3 min continuous standing activity with no knee hyperextension or knee buckling.   Skilled Therapeutic Interventions/Progress Updates:  Pt wearing soft collar during session for tingling in Rt. UE. Supine>Lt. Side>sit with min assist and railings. Stand pivot transfer bed > wheelchair > mat with min assist and intermittent hyperextension but improved from morning session. Pt requires cues for positioning of wheelchair, needs assist for leg rests secondary to back precautions. Sit <> stands from mat elevated progressing to lowered mat, up to mod assist without UE assist. Pt tends to try to "push off" back of legs from mat to compensate for weak quads and poor balance. Standing with one LE propped on box with decreasing UE support (two hands>one hand>no hand support) during simple ring placement task without reaching to comply with precautions. PT to facilitate BLE knee anterior/posterior control and cues to prevent "sinking" into flexing knees, hips, and ankles. Wife present for session.   Therapy Documentation Precautions:  Precautions Precautions: Fall;Back Precaution Comments: reviewed 3/3 back precautions as patient unable to recall any again Required Braces or Orthoses: Spinal Brace Spinal Brace: Lumbar corset;Applied in sitting position Restrictions Weight Bearing Restrictions: No  See FIM for current functional status  Therapy/Group: Individual Therapy  Wilhemina Bonito 03/26/2013, 3:14 PM

## 2013-03-26 NOTE — Plan of Care (Signed)
Problem: SCI BLADDER ELIMINATION Goal: RH STG MANAGE BLADDER WITH ASSISTANCE STG Manage Bladder With min Assistance  Outcome: Not Progressing Patient requiring I/O cath at this time Goal: RH STG MANAGE BLADDER WITH EQUIPMENT WITH ASSISTANCE STG Manage Bladder With Equipment With min Assistance  Outcome: Not Progressing Patient requiring total assist with the catheter

## 2013-03-26 NOTE — Progress Notes (Signed)
Patient information reviewed and entered into eRehab system by Oluwaferanmi Wain, RN, CRRN, PPS Coordinator.  Information including medical coding and functional independence measure will be reviewed and updated through discharge.     Per nursing patient was given "Data Collection Information Summary for Patients in Inpatient Rehabilitation Facilities with attached "Privacy Act Statement-Health Care Records" upon admission.  

## 2013-03-27 ENCOUNTER — Inpatient Hospital Stay (HOSPITAL_COMMUNITY): Payer: Medicare Other

## 2013-03-27 ENCOUNTER — Inpatient Hospital Stay (HOSPITAL_COMMUNITY): Payer: Medicare Other | Admitting: *Deleted

## 2013-03-27 ENCOUNTER — Inpatient Hospital Stay (HOSPITAL_COMMUNITY): Payer: Medicare Other | Admitting: Physical Therapy

## 2013-03-27 LAB — URINE CULTURE

## 2013-03-27 MED ORDER — POLYETHYLENE GLYCOL 3350 17 G PO PACK
17.0000 g | PACK | Freq: Two times a day (BID) | ORAL | Status: DC
  Administered 2013-03-27 – 2013-04-04 (×16): 17 g via ORAL
  Filled 2013-03-27 (×19): qty 1

## 2013-03-27 MED ORDER — BETHANECHOL CHLORIDE 25 MG PO TABS
25.0000 mg | ORAL_TABLET | Freq: Three times a day (TID) | ORAL | Status: DC
  Administered 2013-03-27 – 2013-03-28 (×4): 25 mg via ORAL
  Filled 2013-03-27 (×7): qty 1

## 2013-03-27 MED ORDER — CALCIUM POLYCARBOPHIL 625 MG PO TABS
625.0000 mg | ORAL_TABLET | Freq: Every day | ORAL | Status: DC
  Administered 2013-03-27: 12:00:00 via ORAL
  Administered 2013-03-28 – 2013-03-31 (×4): 625 mg via ORAL
  Administered 2013-04-01: 09:00:00 via ORAL
  Administered 2013-04-02 – 2013-04-04 (×3): 625 mg via ORAL
  Filled 2013-03-27 (×10): qty 1

## 2013-03-27 MED ORDER — TAMSULOSIN HCL 0.4 MG PO CAPS
0.4000 mg | ORAL_CAPSULE | Freq: Two times a day (BID) | ORAL | Status: DC
  Administered 2013-03-27 – 2013-04-04 (×17): 0.4 mg via ORAL
  Filled 2013-03-27 (×18): qty 1

## 2013-03-27 NOTE — Progress Notes (Signed)
Physical Therapy Note  Patient Details  Name: Anthony Walls MRN: [id number] Date of Birth: [birth year] Today's Date: 03/27/2013  1:00 - 1:30 30 minutes Individual session Patient denies pain. Patient reports numbness and cold feeling in first 2 fingers on each hand.  Treatment focused on ambulation and LE strengthening exercises. Patient propelled wheelchair 190 feet with supervision on level tile. Patient sit to stand with close supervision. Patient ambulated with min assist with rolling walker 120 feet. Patient with narrow base of support and tends to hyperextend knees right greater than left and maintains right hip retracted during ambulation. Patient performed bilateral LE exercise in standing for 2 sets of 10 reps each of body raises on 4 inch step working on knee control in extension, hip abduction, and knee flexion. Patient ambulated 190 feet with RW and min assist in controlled environment. Patient left in wheelchair with items in reach.   Arelia Longest M 03/27/2013, 1:41 PM

## 2013-03-27 NOTE — Plan of Care (Signed)
Problem: RH PAIN MANAGEMENT Goal: RH STG PAIN MANAGED AT OR BELOW PT'S PAIN GOAL Pain <3  Outcome: Progressing No c/o pain     

## 2013-03-27 NOTE — Progress Notes (Addendum)
Subjective/Complaints: Still constipated. Requiring I and O caths. A 12 point review of systems has been performed and if not noted above is otherwise negative.   Objective: Vital Signs: Blood pressure 133/79, pulse 75, temperature 99 F (37.2 C), temperature source Oral, resp. rate 18, height 5\' 4"  (1.626 m), weight 94.3 kg (207 lb 14.3 oz), SpO2 100.00%. No results found.  Recent Labs  03/26/13 0627  WBC 8.6  HGB 7.6*  HCT 22.9*  PLT 265    Recent Labs  03/26/13 0627  NA 134*  K 4.9  CL 101  GLUCOSE 118*  BUN 33*  CREATININE 1.48*  CALCIUM 8.6   CBG (last 3)  No results found for this basename: GLUCAP,  in the last 72 hours  Wt Readings from Last 3 Encounters:  03/25/13 94.3 kg (207 lb 14.3 oz)  03/20/13 95.1 kg (209 lb 10.5 oz)  03/20/13 95.1 kg (209 lb 10.5 oz)    Physical Exam:  HENT:  Head: Normocephalic and atraumatic.  Edentulous  Eyes: Pupils are equal, round, and reactive to light.  Neck: Normal range of motion.  Cardiovascular: Normal rate and regular rhythm. No murmurs  Pulmonary/Chest: Effort normal and breath sounds normal. No wheezes or rales  Abdominal: He exhibits distension. Bowel sounds are present There is no tenderness.  Musculoskeletal: He exhibits no edema and no tenderness.  Neurological: He is alert and oriented to person, place, and time.  LLE weakness with ataxia. Decreased sensation LLE greater than RLE to propioception. Only mild LT/PP sensation loss. LE strength 2 to 2+/5 prox to 3+-4/5 distally  Skin: Skin is warm and dry.  Back incision clean and dry with steri-strips in place. Well approximated   Assessment/Plan: 1. 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 Physiatrist is providing close team supervision and 24 hour management of active medical problems listed  below. Physiatrist and rehab team continue to assess barriers to discharge/monitor patient progress toward functional and medical goals. FIM: FIM - Bathing Bathing Steps Patient Completed: Chest;Right Arm;Left Arm;Abdomen;Front perineal area;Right upper leg;Left upper leg Bathing: 3: Mod-Patient completes 5-7 93f 10 parts or 50-74%  FIM - Upper Body Dressing/Undressing Upper body dressing/undressing steps patient completed: Thread/unthread right sleeve of pullover shirt/dresss;Thread/unthread left sleeve of pullover shirt/dress;Put head through opening of pull over shirt/dress;Pull shirt over trunk Upper body dressing/undressing: 5: Set-up assist to: Obtain clothing/put away FIM - Lower Body Dressing/Undressing Lower body dressing/undressing: 1: Total-Patient completed less than 25% of tasks  FIM - Toileting Toileting: 0: Activity did not occur  FIM - Archivist Transfers: 4-To toilet/BSC: Min A (steadying Pt. > 75%);4-From toilet/BSC: Min A (steadying Pt. > 75%)  FIM - Bed/Chair Transfer Bed/Chair Transfer: 3: Supine > Sit: Mod A (lifting assist/Pt. 50-74%/lift 2 legs;3: Bed > Chair or W/C: Mod A (lift or lower assist);3: Chair or W/C > Bed: Mod A (lift or lower assist)  FIM - Locomotion: Wheelchair Distance: 175' Locomotion: Wheelchair: 4: Travels 150 ft or more: maneuvers on rugs and over door sillls with minimal assistance (Pt.>75%) FIM - Locomotion: Ambulation Locomotion: Ambulation Assistive Devices: Designer, industrial/product Ambulation/Gait Assistance: 3: Mod assist Locomotion: Ambulation: 1: Travels less than 50 ft with moderate assistance (Pt: 50 - 74%)  Comprehension Comprehension Mode: Auditory Comprehension: 6-Follows complex conversation/direction: With extra time/assistive device  Expression Expression Mode: Verbal Expression: 6-Expresses complex ideas: With extra time/assistive device  Social Interaction Social  Interaction: 6-Interacts appropriately with  others with medication or extra time (anti-anxiety, antidepressant).  Problem Solving Problem Solving: 6-Solves complex problems: With extra time  Memory Memory: 6-More than reasonable amt of time  Medical Problem List and Plan:  1. DVT Prophylaxis/Anticoagulation: Mechanical: Antiembolism stockings, knee (TED hose) Bilateral lower extremities  Sequential compression devices, below knee Bilateral lower extremities  2. OA/ Pain Management: prn medications effective.  3. Mood: Motivated to get better.  4. Neuropsych: This patient is capable of making decisions on his/her own behalf.  5. HTN: Monitor with bid checks. Continue cozaar. Encourage po fluid intake.  6. ABLA: will add iron supplement.  7.Constipation: results with SSE yesterday. Daily regimen with miralax. Add fibercon 8. 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 Continue flomax. UA unremarkable, ucx pending.   -add urecholine 9. Mild renal insufficiency- push fluids. Recheck tomorrow LOS (Days) 2 A FACE TO FACE EVALUATION WAS PERFORMED  Anthony Walls T 03/27/2013 8:33 AM

## 2013-03-27 NOTE — Progress Notes (Signed)
Physical Therapy Session Note  Patient Details  Name: Anthony Walls MRN: [id number] Date of Birth: 1941-01-23  Today's Date: 03/27/2013 Time: 0930-1025 Time Calculation (min): 55 min  Short Term Goals: Week 1:  PT Short Term Goal 1 (Week 1): Pt will ambulate x 25' with min-guard assist and LRAD PT Short Term Goal 2 (Week 1): Pt will transfer bed <> wheelchair with supervision PT Short Term Goal 3 (Week 1): Pt will tolerate 3 min continuous standing activity with no knee hyperextension or knee buckling.   Skilled Therapeutic Interventions/Progress Updates:    Wheelchair propulsion and mobility x 175' with supervision, some difficulty making turns and setting up for transfers (mod verbal cues). Remembered 2/3 back precautions, reviewed all 3. Pt needs mod verbal cues to functionally adhere to precautions. Stand pivot transfers min assist cues for knee control and upright posture. Heel taps to "dots" working single limb stance knee control and hip extension with RW support. Carried over NMR to ambulation forwards and backwards with RW. PT to facilitate Rt. Hip extension, weight shift over Rt. LE, and to prevent hyperextension. Mini squats focusing on slow concentric/eccentric control. Standing knee extension against orange band working on quad/hamstring closed chain control of extension.   Therapy Documentation Precautions:  Precautions Precautions: Fall;Back Precaution Comments: reviewed 3/3 back precautions as patient unable to recall any again Required Braces or Orthoses: Spinal Brace Spinal Brace: Lumbar corset;Applied in sitting position Restrictions Weight Bearing Restrictions: No Pain: Pain Assessment Pain Assessment: No/denies pain  See FIM for current functional status  Therapy/Group: Individual Therapy  Wilhemina Bonito 03/27/2013, 12:06 PM

## 2013-03-27 NOTE — Progress Notes (Signed)
Occupational Therapy Note  Patient Details  Name: [name] MRN: [id number] Date of Birth: [birth year] Today's Date: 03/27/2013  Time: 7:30-8:30am ( .)  Pt seen for 1:1 OT session focusing on BADLs, functional mobility, transfers and overall safety. Pt supine in bed upon arrival, agreeable to bathe and dress. Pt requested a sink bath this morning. No pain reported. Pt sat EOB and was able to donn lumbar brace EOB with S for safety. Pt used RW to ambulate to sink and did some of bathing standing with close S. Intermittent verbal cues for pacing, back precautions. Pt was able to tell therapist most of back precautions, leaving out "no twisting." Spoke to pt about utilizing a LH sponge, reacher, sock aide and LH shoehorn until precautions are lifted. Nursing came in several times to administer medication and to scan bladder. Pt attempted to use urinal, although is still having difficulty. Pt able to manage LB clothing once pulled up to upper legs. Ended session with pt sitting in wheelchair with call bell within reach.   Denina Rieger Hessie Diener 03/27/2013, 12:38 PM

## 2013-03-27 NOTE — Plan of Care (Signed)
Problem: SCI BLADDER ELIMINATION Goal: RH STG MANAGE BLADDER WITH EQUIPMENT WITH ASSISTANCE STG Manage Bladder With Equipment With min Assistance  Outcome: Not Progressing Requires I&O cath q6h by staff.

## 2013-03-27 NOTE — Progress Notes (Signed)
Physical Therapy Session Note  Patient Details  Name: Anthony Walls MRN: [id number] Date of Birth: [birth year]  Today's Date: 03/27/2013 Time: 4098-1191 Time Calculation (min): 45 min  Short Term Goals: Week 1:  PT Short Term Goal 1 (Week 1): Pt will ambulate x 25' with min-guard assist and LRAD PT Short Term Goal 2 (Week 1): Pt will transfer bed <> wheelchair with supervision PT Short Term Goal 3 (Week 1): Pt will tolerate 3 min continuous standing activity with no knee hyperextension or knee buckling.   Skilled Therapeutic Interventions/Progress Updates:    Patient received sitting in wheelchair. This session focused on wheelchair mobility, gait training, and LE strengthening with emphasis on B knee control during exercises. Initiated [place] exercises standing at parallel bar for B UE support: mini squats, heel raises, hip abduction, knee flexion, 2 sets x10 reps each. Patient requires frequent rest breaks secondary to fatigue. See details below for gait training.  Patient returned to room and left seated in wheelchair with all needs within reach.  Therapy Documentation Precautions:  Precautions Precautions: Fall;Back Required Braces or Orthoses: Spinal Brace Spinal Brace: Lumbar corset;Applied in sitting position Restrictions Weight Bearing Restrictions: No Pain: Pain Assessment Pain Assessment: No/denies pain Pain Score: 0-No pain Locomotion : Ambulation Ambulation: Yes Ambulation/Gait Assistance: 4: Min assist Ambulation Distance (Feet): 63 Feet Assistive device: Rolling walker Ambulation/Gait Assistance Details: Verbal cues for gait pattern;Verbal cues for precautions/safety;Tactile cues for posture Ambulation/Gait Assistance Details: Patient instructed in gait training x63' in controlled environment. Gait Gait: Yes Gait Pattern: Decreased stride length;Trunk flexed;Step-through pattern Wheelchair Mobility Wheelchair Mobility: Yes Wheelchair Assistance: 5:  Supervision Wheelchair Assistance Details: Verbal cues for technique;Verbal cues for sequencing;Verbal cues for safe use of DME/AE Wheelchair Propulsion: Both upper extremities Wheelchair Parts Management: Needs assistance Distance: 190   See FIM for current functional status  Therapy/Group: Individual Therapy  Chipper Herb. Yanin Muhlestein, PT, DPT  03/27/2013, 4:01 PM

## 2013-03-28 ENCOUNTER — Inpatient Hospital Stay (HOSPITAL_COMMUNITY): Payer: Medicare Other

## 2013-03-28 LAB — BASIC METABOLIC PANEL
CO2: 26 mEq/L (ref 19–32)
Calcium: 8.9 mg/dL (ref 8.4–10.5)
GFR calc Af Amer: 55 mL/min — ABNORMAL LOW (ref >90)
Sodium: 132 mEq/L — ABNORMAL LOW (ref 135–145)

## 2013-03-28 MED ORDER — BETHANECHOL CHLORIDE 25 MG PO TABS
25.0000 mg | ORAL_TABLET | Freq: Three times a day (TID) | ORAL | Status: DC
  Administered 2013-03-28 – 2013-03-31 (×8): 25 mg via ORAL
  Filled 2013-03-28 (×11): qty 1

## 2013-03-28 MED ORDER — LIDOCAINE HCL 2 % EX GEL: CUTANEOUS | Status: DC | PRN

## 2013-03-28 MED ORDER — LIDOCAINE HCL 2 % EX GEL
Freq: Once | CUTANEOUS | Status: DC
  Filled 2013-03-28: qty 5

## 2013-03-28 NOTE — Progress Notes (Signed)
Physical Therapy Session Note  Patient Details  Name: Anthony Walls MRN: [id number] Date of Birth: [birth year]  Today's Date: 03/28/2013 Time: 7829-5621 Time Calculation (min): 60 min  Short Term Goals: Week 1:  PT Short Term Goal 1 (Week 1): Pt will ambulate x 25' with min-guard assist and LRAD PT Short Term Goal 2 (Week 1): Pt will transfer bed <> wheelchair with supervision PT Short Term Goal 3 (Week 1): Pt will tolerate 3 min continuous standing activity with no knee hyperextension or knee buckling.   Skilled Therapeutic Interventions/Progress Updates:    w/c propulsion down to therapy gym for endurance and strengthening in UE's with S. Focused on neuro re-ed for balance training and functional strengthening of LE's on compliant surface (foam and progressed to Bosu ball) for weightshifting and ankle strategies; eccentric control on step with bilateral UE support going up, laterally and down with 10 reps each direction and bilaterally - manual facilitation at quad with L laterally. Rest breaks as needed. Simulated car transfer with S; pt correctly demonstrating safe technique and maintaining precautions. Gait on unit with RW with focus on gait pattern (slight hyperextension at L knee only noted as pt fatigued and able to self correct) > 150' with min A. Wife present for therapy session and pt educating wife on techniques he has learned so far on rehab.  Also discussed follow up therapies as pt and wife with questions - would recommend OPPT follow up at this time and both pt and wife in agreement/understanding. Wife prefers for OPPT to be closer to home in [facility]. Recommended to discuss follow up options with Child psychotherapist.  Therapy Documentation Precautions:  Precautions Precautions: Fall;Back Precaution Comments: reviewed 3/3 back precautions as patient unable to recall any again Required Braces or Orthoses: Spinal Brace Spinal Brace: Lumbar corset;Applied in sitting  position Restrictions Weight Bearing Restrictions: No   Pain: Pain Assessment Pain Assessment: No/denies pain  See FIM for current functional status  Therapy/Group: Individual Therapy  [name] [facility] 03/28/2013, 10:44 AM

## 2013-03-28 NOTE — Progress Notes (Addendum)
Subjective/Complaints:   Bladder starting to empty a little. Walked with therapies yesterday. A 12 point review of systems has been performed and if not noted above is otherwise negative.   Objective: Vital Signs: Blood pressure 139/69, pulse 77, temperature 99 F (37.2 C), temperature source Oral, resp. rate 18, height 5\' 4"  (1.626 m), weight 94.3 kg (207 lb 14.3 oz), SpO2 94.00%. No results found.  Recent Labs  03/26/13 0627  WBC 8.6  HGB 7.6*  HCT 22.9*  PLT 265    Recent Labs  03/26/13 0627 03/28/13 0608  NA 134* 132*  K 4.9 4.9  CL 101 97  GLUCOSE 118* 115*  BUN 33* 36*  CREATININE 1.48* 1.44*  CALCIUM 8.6 8.9   CBG (last 3)  No results found for this basename: GLUCAP,  in the last 72 hours  Wt Readings from Last 3 Encounters:  03/25/13 94.3 kg (207 lb 14.3 oz)  03/20/13 95.1 kg (209 lb 10.5 oz)  03/20/13 95.1 kg (209 lb 10.5 oz)    Physical Exam:  HENT:  Head: Normocephalic and atraumatic.  Edentulous  Eyes: Pupils are equal, round, and reactive to light.  Neck: Normal range of motion.  Cardiovascular: Normal rate and regular rhythm. No murmurs  Pulmonary/Chest: Effort normal and breath sounds normal. No wheezes or rales  Abdominal: He exhibits distension. Bowel sounds are present There is no tenderness.  Musculoskeletal: He exhibits no edema and no tenderness.  Neurological: He is alert and oriented to person, place, and time.  LLE weakness with ataxia. Decreased sensation LLE greater than RLE to propioception. Only mild LT/PP sensation loss. LE strength 2 to 2+/5 prox to 3+-4/5 distally  Skin: Skin is warm and dry.  Back incision clean and dry with steri-strips in place. Well approximated   Assessment/Plan: 1. 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 Physiatrist is providing close team supervision  and 24 hour management of active medical problems listed below. Physiatrist and rehab team continue to assess barriers to discharge/monitor patient progress toward functional and medical goals. FIM: FIM - Bathing Bathing Steps Patient Completed: Chest;Right Arm;Left Arm;Abdomen;Front perineal area;Right upper leg;Left upper leg Bathing: 3: Mod-Patient completes 5-7 45f 10 parts or 50-74%  FIM - Upper Body Dressing/Undressing Upper body dressing/undressing steps patient completed: Thread/unthread right sleeve of pullover shirt/dresss;Thread/unthread left sleeve of pullover shirt/dress;Put head through opening of pull over shirt/dress;Pull shirt over trunk Upper body dressing/undressing: 5: Supervision: Safety issues/verbal cues FIM - Lower Body Dressing/Undressing Lower body dressing/undressing steps patient completed: Pull underwear up/down;Pull pants up/down (pulled LB clothing up once above knee level, folllowing prec) Lower body dressing/undressing: 1: Total-Patient completed less than 25% of tasks  FIM - Toileting Toileting: 0: Activity did not occur  FIM - Archivist Transfers: 4-To toilet/BSC: Min A (steadying Pt. > 75%);4-From toilet/BSC: Min A (steadying Pt. > 75%)  FIM - Bed/Chair Transfer Bed/Chair Transfer Assistive Devices: Therapist, occupational: 4: Chair or W/C > Bed: Min A (steadying Pt. > 75%);4: Bed > Chair or W/C: Min A (steadying Pt. > 75%)  FIM - Locomotion: Wheelchair Distance: 190 Locomotion: Wheelchair: 5: Travels 150 ft or more: maneuvers on rugs and over door sills with supervision, cueing or coaxing FIM - Locomotion: Ambulation Locomotion: Ambulation Assistive Devices: Designer, industrial/product Ambulation/Gait Assistance: 4: Min assist Locomotion: Ambulation: 2: Travels 50 - 149 ft with minimal assistance (Pt.>75%)  Comprehension Comprehension Mode:  Auditory Comprehension: 6-Follows complex conversation/direction: With extra time/assistive  device  Expression Expression Mode: Verbal Expression: 6-Expresses complex ideas: With extra time/assistive device  Social Interaction Social Interaction: 6-Interacts appropriately with others with medication or extra time (anti-anxiety, antidepressant).  Problem Solving Problem Solving: 6-Solves complex problems: With extra time  Memory Memory: 6-More than reasonable amt of time  Medical Problem List and Plan:  1. DVT Prophylaxis/Anticoagulation: Mechanical: Antiembolism stockings, knee (TED hose) Bilateral lower extremities  Sequential compression devices, below knee Bilateral lower extremities  2. OA/ Pain Management: prn medications effective.  3. Mood: Motivated to get better.  4. Neuropsych: This patient is capable of making decisions on his/her own behalf.  5. HTN: Monitor with bid checks. Continue cozaar. Encourage po fluid intake.  6. ABLA: added iron supplement. Recheck Monday. No signs of blood loss 7.Constipation: results with SSE yesterday. Daily regimen with miralax. Added fibercon 8. 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 Continue flomax. UA unremarkable, ucx negative  -added urecholine which is helping  -continue I and O cath prn 9. Mild renal insufficiency- push fluids. BUN/CR stable today  -recheck monday LOS (Days) 3 A FACE TO FACE EVALUATION WAS PERFORMED  SWARTZ,ZACHARY T 03/28/2013 8:15 AM

## 2013-03-28 NOTE — Progress Notes (Signed)
Occupational Therapy Session Note  Patient Details  Name: Anthony Walls MRN: [id number] Date of Birth: June 16, 1941  Today's Date: 03/28/2013  Session 1 Time: 0830-0930 Time Calculation (min): 60 min  Short Term Goals: Week 1:  OT Short Term Goal 1 (Week 1): Short Term Goals=Long Term Goals  Skilled Therapeutic Interventions/Progress Updates:    Pt seated in w/c with wife at side.  Pt stated he had already washed up and changed clothes.  Discussed with patient and wife that part of this therapy in rehab will include bathing and dressing to increase independence with these tasks.  Both patient and wife verbalized understanding.  Pt stated he had not eaten breakfast yet because he had to reorder.  Notified Nursing Secretary to check on status of breakfast.  Pt engaged in practicing use of AE (reacher and sock aid) to assist with dressing.  Discussed use of long handle sponge for bathing.  Pt transitioned to ADL apartment to practice stepping into tub.  Pt has glass doors on tub at home in a small bathroom.  Pt was unable to lift left leg into tub.  Pt practiced bed mobility on therapy mat.  Pt has bed approx 25" tall at home.  Pt was supervision for bed mobility.  Pt engaged in dynamic standing activities using LUE as support on RW.   Therapy Documentation Precautions:  Precautions Precautions: Fall;Back Precaution Comments: reviewed 3/3 back precautions as patient unable to recall any again Required Braces or Orthoses: Spinal Brace Spinal Brace: Lumbar corset;Applied in sitting position Restrictions Weight Bearing Restrictions: No Pain: Pain Assessment Pain Assessment: No/denies pain  See FIM for current functional status  Therapy/Group: Individual Therapy  Session 2 Time: 1015-1045 Pt denies pain Individual Therapy Pt engaged in sit<>stand activities, squats, dynamic standing tasks, and functional amb with RW to gather linens and place in dirty clothes bag.  Pt amb without RW for  approx 10' with mod A.  Pt is very unstable without AD.  Focus on activity tolerance, dynamic standing balance, functional amb with RW, and safety awareness.  Pt recalled 2/3 back precautions and requires verbal cues for safety awareness when sitting down.  Lavone Neri [facility] 03/28/2013, 9:35 AM

## 2013-03-28 NOTE — Progress Notes (Signed)
Physical Therapy Session Note  Patient Details  Name: Anthony Walls MRN: [id number] Date of Birth: [birth year]  Today's Date: 03/28/2013 Time: 1302-1330 Time Calculation (min): 28 min  Short Term Goals: Week 1:  PT Short Term Goal 1 (Week 1): Pt will ambulate x 25' with min-guard assist and LRAD PT Short Term Goal 2 (Week 1): Pt will transfer bed <> wheelchair with supervision PT Short Term Goal 3 (Week 1): Pt will tolerate 3 min continuous standing activity with no knee hyperextension or knee buckling.   Skilled Therapeutic Interventions/Progress Updates:   Pt stated he wanted to work on strengthening his LLE.  Performed step ups forwards and sideways on BLEs, including stepping up to the second step to increase the hip flexion ROM as pt reported he had difficulty getting his LE into the tub.  Gait training with RW 60-80' x 2 min@.   Therapy Documentation Precautions:  Precautions Precautions: Fall;Back Precaution Comments: reviewed 3/3 back precautions as patient unable to recall any again Required Braces or Orthoses: Spinal Brace Spinal Brace: Lumbar corset;Applied in sitting position Restrictions Weight Bearing Restrictions: No Pain:  No pain Locomotion : Ambulation Ambulation/Gait Assistance: 4: Min assist   See FIM for current functional status  Therapy/Group: Individual Therapy  Georges Mouse 03/28/2013, 3:49 PM

## 2013-03-28 NOTE — Progress Notes (Signed)
Physical Therapy Session Note  Patient Details  Name: Anthony Walls MRN: [id number] Date of Birth: 1941-07-23  Today's Date: 03/28/2013 Time: 1355-1420 Time Calculation (min): 25 min  Short Term Goals: Week 1:  PT Short Term Goal 1 (Week 1): Pt will ambulate x 25' with min-guard assist and LRAD PT Short Term Goal 2 (Week 1): Pt will transfer bed <> wheelchair with supervision PT Short Term Goal 3 (Week 1): Pt will tolerate 3 min continuous standing activity with no knee hyperextension or knee buckling.   Skilled Therapeutic Interventions/Progress Updates:    w/c propulsion down to therapy gym for general endurance and strengthening. Nustep on level 6 for functional strengthening, reciprocal movement pattern to carry over to gait, and overall activity tolerance/endurance x 10 min. Close S transfers with RW. Close S/steady A gait with RW back to pt room > 150'.  Therapy Documentation Precautions:  Precautions Precautions: Fall;Back Precaution Comments: reviewed 3/3 back precautions as patient unable to recall any again Required Braces or Orthoses: Spinal Brace Spinal Brace: Lumbar corset;Applied in sitting position Restrictions Weight Bearing Restrictions: No   Pain:  Denies pain.   See FIM for current functional status  Therapy/Group: Individual Therapy  [name] [facility] 03/28/2013, 2:23 PM

## 2013-03-29 ENCOUNTER — Inpatient Hospital Stay (HOSPITAL_COMMUNITY): Payer: Medicare Other | Admitting: *Deleted

## 2013-03-29 DIAGNOSIS — G8220 Paraplegia, unspecified: Secondary | ICD-10-CM

## 2013-03-29 DIAGNOSIS — C720 Malignant neoplasm of spinal cord: Secondary | ICD-10-CM

## 2013-03-29 NOTE — Progress Notes (Signed)
Patient voided 300 ml. PVR scan for 942 ml.  In and out cath performed with 16 Fr Coude cath for >900 ml.  Some urine was spilled.  Patient tolerated well.

## 2013-03-29 NOTE — Progress Notes (Signed)
Patient ID: Anthony Walls, male   DOB: 07/08/1941, 72 y.o.   MRN: [id number] Subjective/Complaints:   No complaints except he states BMs ar "slow"   Objective: Vital Signs: Blood pressure 99/67, pulse 91, temperature 98.7 F (37.1 C), temperature source Oral, resp. rate 17, height 5\' 4"  (1.626 m), weight 207 lb 14.3 oz (94.3 kg), SpO2 97.00%. No results found. No results found for this basename: WBC, HGB, HCT, PLT,  in the last 72 hours  Recent Labs  03/28/13 0608  NA 132*  K 4.9  CL 97  GLUCOSE 115*  BUN 36*  CREATININE 1.44*  CALCIUM 8.9     Wt Readings from Last 3 Encounters:  03/25/13 207 lb 14.3 oz (94.3 kg)  03/20/13 209 lb 10.5 oz (95.1 kg)  03/20/13 209 lb 10.5 oz (95.1 kg)    Physical Exam:  Elderly male Chest dta cv- reg rate abd- soft, NT,  Ext- no edema   Assessment/Plan: 1. 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 DVT Prophylaxis/Anticoagulation: Mechanical: Antiembolism stockings, knee (TED hose) Bilateral lower extremities  Sequential compression devices, below knee Bilateral lower extremities  2. OA/ Pain Management: prn medications effective.  3. Mood: Motivated to get better.  4. Neuropsych: This patient is capable of making decisions on his/her own behalf.  5. HTN:  BP Readings from Last 3 Encounters:  03/29/13 99/67  03/25/13 131/81  03/25/13 131/81  onlosartan 6. ABLA: added iron supplement. Recheck Monday.  Lab Results  Component Value Date   HGB 7.6* 03/26/2013   7.Constipation: improved but still slow-- will follow 8. 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 Continue flomax. UA unremarkable, ucx negative  -on urecholine   -continue I and O cath prn 9. Mild renal insufficiency- push fluids. BUN/CR stable today  -recheck Monday  - Lab Results  Component Value Date   CREATININE 1.44* 03/28/2013    LOS (Days) 4 A FACE TO FACE EVALUATION  WAS PERFORMED  Jeremias Broyhill HENRY 03/29/2013 11:31 AM

## 2013-03-29 NOTE — Progress Notes (Addendum)
Physical Therapy Session Note  Patient Details  Name: Cleston Lautner MRN: [id number] Date of Birth: [birth year]  Today's Date: 03/29/2013 Time: 1000-1030 Time Calculation (min): 30 min    Patient did not report any pain pre/ during/post session. Gait training with RW 2 x 196 feet with CGA,patient needs one standing break.Slight LOB present.  NuStep  At level 8 LE only 5 min to increase strength and coordination. Exercises with 4 lbs on B ankles included: in sitting 2 x15 LAQ, in standing hip abd 2 x 10 , knee flexion 2 x10, step ups on a 6 in step  2 x15 Review of back precautions with patient, he is able to recall all precautions. Therapy Documentation Precautions:  Precautions Precautions: Fall;Back Precaution Comments: reviewed 3/3 back precautions as patient unable to recall any again Required Braces or Orthoses: Spinal Brace Spinal Brace: Lumbar corset;Applied in sitting position Restrictions Weight Bearing Restrictions: No Vital Signs: Therapy Vitals Pulse Rate: 91 BP: 99/67 mmHg  See FIM for current functional status  Therapy/Group: Individual Therapy  Dorna Mai 03/29/2013, 10:41 AM

## 2013-03-29 NOTE — Progress Notes (Signed)
Patient voided 200 ml clear, yellow urine.  Scan for 403.  In and out cath performed for 500 ml.  Patient tolerated well.

## 2013-03-30 ENCOUNTER — Inpatient Hospital Stay (HOSPITAL_COMMUNITY): Payer: Medicare Other | Admitting: Occupational Therapy

## 2013-03-30 ENCOUNTER — Inpatient Hospital Stay (HOSPITAL_COMMUNITY): Payer: Medicare Other | Admitting: *Deleted

## 2013-03-30 NOTE — Progress Notes (Signed)
Physical Therapy Session Note  Patient Details  Name: Anthony Walls MRN: [id number] Date of Birth: 06-09-1941  Today's Date: 03/30/2013 Time: 0240-0330 Time Calculation (min): 50 min   Skilled Therapeutic Interventions/Progress Updates:  Gait training with RW 1x 196 feet 1x 70 feet with CGA and cues for posture and step length, patient presented LOb x1 able to correct posture using stepping strategy. Biodex balance training (Maze 1x on stable surface 1 x on level 12). Exercises with #4 weights for SLR (in supine)and LAQ 2 x 15 each LE. Transfers sit<=>supine on mat with repeating back precautions x3. Obstacle course included maneuvering around cones stepping over poles and maneuvering steps up an down. Balance training in unsupported standing. Training in fall recovery while maintaining back precautions. Patient has difficulty getting of the floor,needs further training.  Therapy Documentation Precautions:  Precautions Precautions: Fall;Back Precaution Comments: reviewed 3/3 back precautions as patient unable to recall any again Required Braces or Orthoses: Spinal Brace Spinal Brace: Lumbar corset;Applied in sitting position Restrictions Weight Bearing Restrictions: No Vital Signs: Therapy Vitals Temp: 98 F (36.7 C) Temp src: Oral Pulse Rate: 95 Resp: 18 BP: 135/69 mmHg Patient Position, if appropriate: Sitting Oxygen Therapy SpO2: 100 % O2 Device: None (Room air) Pain: Pain Assessment Pain Assessment: No/denies pain  See FIM for current functional status  Therapy/Group: Individual Therapy  Dorna Mai 03/30/2013, 3:57 PM

## 2013-03-30 NOTE — Progress Notes (Signed)
Patient voided 150 ml amber urine.  Bladder scan results of 562 ml.   In and out cath performed using 16 Fr Coude cath tip with results of 500 ml clear, amber urine.   Patient tolerated well.

## 2013-03-30 NOTE — Progress Notes (Signed)
Physical Therapy Session Note  Patient Details  Name: Anthony Walls MRN: [id number] Date of Birth: 04/07/1941  Today's Date: 03/30/2013 Time: 1000-1100 Time Calculation (min): 60 min  Short Term Goals: Week 1:  PT Short Term Goal 1 (Week 1): Pt will ambulate x 25' with min-guard assist and LRAD PT Short Term Goal 2 (Week 1): Pt will transfer bed <> wheelchair with supervision PT Short Term Goal 3 (Week 1): Pt will tolerate 3 min continuous standing activity with no knee hyperextension or knee buckling.   Skilled Therapeutic Interventions/Progress Updates:    Patient participated in group therapy session:  -B LE there ex seated in wheelchair: alternating hip flexion, knee extension, ankle pumps x30 reps each -Ambulation: 174' with rolling walker and min guard-min assist -Stair Negotiation: 5 stairs with B handrails and min assist to simulate home environment -Repeated sit<>stands: x40 with rolling walker with supervision, requires repeated verbal cues for proper hand placement -NuStep Level 6 x13' with B LE, no rest breaks -Wheelchair mobility x150' with B UE and supervision, requires increased time to complete.  Patient returned to room and left seated in wheelchair with all needs within reach, wife present. Patient and wife encouraged to call for assistance.  Therapy Documentation Precautions:  Precautions Precautions: Fall;Back Required Braces or Orthoses: Spinal Brace Spinal Brace: Lumbar corset;Applied in sitting position Restrictions Weight Bearing Restrictions: No Pain: Pain Assessment Pain Assessment: No/denies pain Pain Score: 0-No pain Locomotion : Ambulation Ambulation/Gait Assistance: 4: Min assist Wheelchair Mobility Distance: 150   See FIM for current functional status  Therapy/Group: Group Therapy  [employer]. Torin Whisner, PT, DPT  03/30/2013, 11:11 AM

## 2013-03-30 NOTE — Progress Notes (Signed)
Occupational Therapy Session Note  Patient Details  Name: Anthony Walls MRN: [id number] Date of Birth: 1941/11/06  Today's Date: 03/30/2013   1st session:Time:  - 7:40-825   Time Calculation (min)=45  Skilled Therapeutic Interventions/Progress Updates:  Upon approach for OT, patient stated he needed to toilet.  He was able to recall BAT precautions and stated, "and they added one more:  I am not supposed to do any lifting."  Then he donned lumbar corsett with Min A and scolded his wife for trying to help him.  He transferred to toilet via walker with Supervision and cues not to place walker too far out front.   He made remarks of frustration when not able to have BM.  When asked if he would be able to focus on his BM while bathing with water in front on toilet, he stated, "Yes."   He bathed upper body, and then stated,  "This is unsanitary, and I do not want to do this."    This clinician removed tray table and water from in front of patient, informed him that this clinician had spoken with CNA who stated she would help him finish ADL if he was not able to complete it during his scheduled ADL.   While this clinician waited from him to complete elimination, wife was verbally with clinician demonstration educated on AE use.   She stated, prior OT had patient work on this one time previous and that husband appeared to do ok but could use more practice.  CNA came in at end of session and assisted patient with further cleansing of bottom as he was not able to eliminate except for one piece he called, " A goat turd."  ------------------------------------------------------------------- 2nd session:   Time:  1345-1440 Time Calcuation (min): 55   Skilled Therapeutic Interventions/Progress Updates: Upon approach for therapy, patient asked clinician to come back later while he visited with a church member.    2nd Session went as follows:  Wife stated she would return to work around first of May so  patient concurred to work on donning/doffing socks and shoes in order to stay within No bending precaution.    He was able to don doff right sock and shoe without equipment and was able to don/doff left sock with sock aide and don/doff left tie up shoe with reacher/LH shoe horn with  Min A -------------------------------------------------------------------------------------- Total Time Calcluation:100 minutes --------------------------------------------------------------------------------------  Patient tended to be a little moody during sessions.  He was either nice or grumpy at beginning of session, and then when staff or wife mentioned or asked him to do something different than he had in mind, he made negative statements and became impatient.    Therapy Documentation Precautions:  Precautions Precautions: Fall;Back Precaution Comments: reviewed 3/3 back precautions as patient unable to recall any again Required Braces or Orthoses: Spinal Brace Spinal Brace: Lumbar corset;Applied in sitting position Restrictions Weight Bearing Restrictions: No  Pain:denied   See FIM for current functional status  Therapy/Group: Individual Therapy  [name] [facility] 03/30/2013, 7:59 AM

## 2013-03-30 NOTE — Progress Notes (Signed)
Patient ID: Anthony Walls, male   DOB: 1941-05-21, 72 y.o.   MRN: [id number] Subjective/Complaints: Feels better. Bowel movements have improved.   Objective: Vital Signs:  Recent Labs  03/28/13 0608  NA 132*  K 4.9  CL 97  GLUCOSE 115*  BUN 36*  CREATININE 1.44*  CALCIUM 8.9     Wt Readings from Last 3 Encounters:  03/25/13 207 lb 14.3 oz (94.3 kg)  03/20/13 209 lb 10.5 oz (95.1 kg)  03/20/13 209 lb 10.5 oz (95.1 kg)    Physical Exam:  Elderly male Chest dta cv- reg rate abd- soft, NT,  Ext- no edema   Assessment/Plan: 1. 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 DVT Prophylaxis/Anticoagulation: Mechanical: Antiembolism stockings, knee (TED hose) Bilateral lower extremities  Sequential compression devices, below knee Bilateral lower extremities  2. OA/ Pain Management: prn medications effective.  3. Mood: Motivated to get better.  4. Neuropsych: This patient is capable of making decisions on his/her own behalf.  5. HTN:  Improved. Continue current medications. 6. ABLA: added iron supplement. Recheck  tomorrow  Lab Results  Component Value Date   HGB 7.6* 03/26/2013   7.Constipation: improved 8. 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 Continue flomax. UA unremarkable, ucx negative  -on urecholine   -continue I and O cath prn 9. Mild renal insufficiency- push fluids. BUN/CR stable today  -recheck Monday  - Lab Results  Component Value Date   CREATININE 1.44* 03/28/2013    LOS (Days) 5 A FACE TO FACE EVALUATION WAS PERFORMED  Nery Kalisz HENRY 03/30/2013 8:45 AM

## 2013-03-31 ENCOUNTER — Inpatient Hospital Stay (HOSPITAL_COMMUNITY): Payer: Medicare Other

## 2013-03-31 ENCOUNTER — Inpatient Hospital Stay (HOSPITAL_COMMUNITY): Payer: Medicare Other | Admitting: Physical Therapy

## 2013-03-31 LAB — BASIC METABOLIC PANEL
GFR calc Af Amer: 51 mL/min — ABNORMAL LOW (ref >90)
GFR calc non Af Amer: 44 mL/min — ABNORMAL LOW (ref >90)
Glucose, Bld: 120 mg/dL — ABNORMAL HIGH (ref 70–99)
Potassium: 4.4 mEq/L (ref 3.5–5.1)
Sodium: 134 mEq/L — ABNORMAL LOW (ref 135–145)

## 2013-03-31 LAB — CBC
Hemoglobin: 7.5 g/dL — ABNORMAL LOW (ref 13.0–17.0)
RBC: 2.6 MIL/uL — ABNORMAL LOW (ref 4.22–5.81)
WBC: 6.9 10*3/uL (ref 4.0–10.5)

## 2013-03-31 MED ORDER — BETHANECHOL CHLORIDE 25 MG PO TABS
25.0000 mg | ORAL_TABLET | Freq: Four times a day (QID) | ORAL | Status: DC
  Administered 2013-03-31 – 2013-04-04 (×16): 25 mg via ORAL
  Filled 2013-03-31 (×20): qty 1

## 2013-03-31 NOTE — Progress Notes (Signed)
Occupational Therapy Note  Patient Details  Name: Anthony Walls MRN: [id number] Date of Birth: February 09, 1941 Today's Date: 03/31/2013  Time: 10:30-11:12am ( .) Pt seen for 1:1 OT session focusing on transfers, standing and activity tolerance. Pt stated he wanted to rehearse tub transfers. Rehearsed transfers, although pt does not have a tub bench at home, rather a shower chair (with glass sliding doors on tub.) Recommended they have GB's installed for increased safety. Pt and wife stated they are looking into it. Pt used RW to walk from gym into ADL apartment and also back to his room. Pt was able to state his back precautions, although required 2 verbal cues to not twist back during session. S for transfers including into wheelchair, tub bench and commode. Recommended pt and wife purchase a hip kit from gift shop once they are discharged to assist with ADL's and adhering to precautions.No pain reported during tx session today, although pt did state he felt some "throbbing" in his bil heels during walking.     Brittay Mogle Hessie Diener 03/31/2013, 11:17 AM

## 2013-03-31 NOTE — Progress Notes (Signed)
Patient voided 200 ml.  PVR bladder scan for 147 ml.

## 2013-03-31 NOTE — Progress Notes (Signed)
[facility] Individual Statement of Services  Patient Name:  Anthony Walls  Date:  03/31/2013  Welcome to the [facility].  Our goal is to provide you with an individualized program based on your diagnosis and situation, designed to meet your specific needs.  With this comprehensive rehabilitation program, you will be expected to participate in at least 3 hours of rehabilitation therapies Monday-Friday, with modified therapy programming on the weekends.  Your rehabilitation program will include the following services:  Physical Therapy (PT), Occupational Therapy (OT), 24 hour per day rehabilitation nursing, Therapeutic Recreaction (TR), Case Management (Social Worker), Rehabilitation Medicine, Nutrition Services and Pharmacy Services  Weekly team conferences will be held on Tuesdays to discuss your progress.  Your Social Worker will talk with you frequently to get your input and to update you on team discussions.  Team conferences with you and your family in attendance may also be held.  Expected length of stay: 10 days Overall anticipated outcome: supervision - minimal assist  Depending on your progress and recovery, your program may change. Your Social Worker will coordinate services and will keep you informed of any changes. Your Social Worker's name and contact numbers are listed  below.  The following services may also be recommended but are not provided by the [facility]:   Driving Evaluations  Home Health Rehabiltiation Services  Outpatient Rehabilitatation Servives    Arrangements will be made to provide these services after discharge if needed.  Arrangements include referral to agencies that provide these services.  Your insurance has been verified to be:  Medicare Your primary doctor is:  Dr. Lendon Colonel  Pertinent information will be shared with your doctor and your insurance company.  Social Worker:  [place], [place]  [phone] or [phone]  Information discussed with and copy given to patient by: Amada Jupiter, 03/31/2013, 12:40 PM

## 2013-03-31 NOTE — Progress Notes (Signed)
Patient voided 350 ml. Scan for 570 ml.  Coude cath performed for 550 ml.  Patient tolerated well.

## 2013-03-31 NOTE — Progress Notes (Signed)
Subjective/Complaints:   Still some urine retention. Pain controlled generally. Emptying bowels. A 12 point review of systems has been performed and if not noted above is otherwise negative.   Objective: Vital Signs: Blood pressure 123/58, pulse 71, temperature 98.5 F (36.9 C), temperature source Oral, resp. rate 19, height 5\' 4"  (1.626 m), weight 94.3 kg (207 lb 14.3 oz), SpO2 97.00%. No results found.  Recent Labs  03/31/13 0500  WBC 6.9  HGB 7.5*  HCT 22.3*  PLT 336    Recent Labs  03/31/13 0500  NA 134*  K 4.4  CL 102  GLUCOSE 120*  BUN 33*  CREATININE 1.51*  CALCIUM 8.8   CBG (last 3)  No results found for this basename: GLUCAP,  in the last 72 hours  Wt Readings from Last 3 Encounters:  03/25/13 94.3 kg (207 lb 14.3 oz)  03/20/13 95.1 kg (209 lb 10.5 oz)  03/20/13 95.1 kg (209 lb 10.5 oz)    Physical Exam:  HENT:  Head: Normocephalic and atraumatic.  Edentulous  Eyes: Pupils are equal, round, and reactive to light.  Neck: Normal range of motion.  Cardiovascular: Normal rate and regular rhythm. No murmurs  Pulmonary/Chest: Effort normal and breath sounds normal. No wheezes or rales  Abdominal: He exhibits minimal dstension. Bowel sounds are present There is no tenderness.  Musculoskeletal: He exhibits no edema and no tenderness.  Neurological: He is alert and oriented to person, place, and time.  LLE weakness with ataxia. Decreased sensation LLE greater than RLE to propioception. Only mild LT/PP sensation loss. LE strength 2 to 2+/5 prox to 3+ to 4/5 distally  Skin: Skin is warm and dry.  Back incision clean and dry with steri-strips in place. Well approximated   Assessment/Plan: 1. 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 Physiatrist is providing close team supervision and 24 hour management of active  medical problems listed below. Physiatrist and rehab team continue to assess barriers to discharge/monitor patient progress toward functional and medical goals. FIM: FIM - Bathing Bathing Steps Patient Completed: Chest;Right Arm;Left Arm;Abdomen Bathing: 5: Supervision: Safety issues/verbal cues  FIM - Upper Body Dressing/Undressing Upper body dressing/undressing steps patient completed: Thread/unthread right sleeve of pullover shirt/dresss;Thread/unthread left sleeve of pullover shirt/dress;Put head through opening of pull over shirt/dress;Pull shirt over trunk Upper body dressing/undressing: 0: Activity did not occur FIM - Lower Body Dressing/Undressing Lower body dressing/undressing steps patient completed: Pull underwear up/down;Pull pants up/down (pulled LB clothing up once above knee level, folllowing prec) Lower body dressing/undressing: 0: Activity did not occur  FIM - Toileting Toileting steps completed by patient: Adjust clothing prior to toileting Toileting: 2: Max-Patient completed 1 of 3 steps  FIM - Diplomatic Services operational officer Devices: Grab bars;Walker Toilet Transfers: 5-To toilet/BSC: Supervision (verbal cues/safety issues)  FIM - Banker Devices: Environmental consultant;Arm rests Bed/Chair Transfer: 0: Activity did not occur  FIM - Locomotion: Wheelchair Distance: 150 Locomotion: Wheelchair: 2: Travels 50 - 149 ft with supervision, cueing or coaxing FIM - Locomotion: Ambulation Locomotion: Ambulation Assistive Devices: Designer, industrial/product Ambulation/Gait Assistance: 4: Min assist Locomotion: Ambulation: 4: Travels 150 ft or more with minimal assistance (Pt.>75%)  Comprehension Comprehension Mode: Auditory Comprehension: 6-Follows complex conversation/direction: With extra time/assistive device  Expression Expression Mode: Verbal Expression: 6-Expresses complex ideas: With extra time/assistive device  Social  Interaction Social Interaction: 7-Interacts appropriately with others - No medications  needed.  Problem Solving Problem Solving: 6-Solves complex problems: With extra time  Memory Memory: 7-Complete Independence: No helper  Medical Problem List and Plan:  1. DVT Prophylaxis/Anticoagulation: Mechanical: Antiembolism stockings, knee (TED hose) Bilateral lower extremities  Sequential compression devices, below knee Bilateral lower extremities  2. OA/ Pain Management: prn medications effective.  3. Mood: Motivated to get better.  4. Neuropsych: This patient is capable of making decisions on his/her own behalf.  5. HTN: Monitor with bid checks. Continue cozaar. Encourage po fluid intake.  6. ABLA: added iron supplement. hgb holding around 7.5. Follow serially 7.Constipation: results with SSE yesterday. Daily regimen with miralax. Added fibercon 8. 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 Continue flomax. UA unremarkable, ucx negative  -added urecholine which is helping--increase to qid  -continue I and O cath prn  -needs to be at EOB when voiding 9. Mild renal insufficiency- push fluids. BUN/CR stable today  -improved today LOS (Days) 6 A FACE TO FACE EVALUATION WAS PERFORMED  Dhwani Venkatesh T 03/31/2013 8:29 AM

## 2013-03-31 NOTE — Progress Notes (Signed)
Social Work  Social Work Assessment and Plan  Patient Details  Name: Anthony Walls MRN: [id number] Date of Birth: [birth year]  Today's Date: 03/31/2013  Problem List:  Patient Active Problem List   Diagnosis Date Noted  . 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 Acute blood loss anemia 03/26/2013  . HTN (hypertension) 03/26/2013  . Renal insufficiency 03/26/2013   Past Medical History:  Past Medical History  Diagnosis Date  . Hypertension   . Arthritis   . Hesitancy     ?BPH   Past Surgical History:  Past Surgical History  Procedure Laterality Date  . Brain surgery  98    tumor  . Laminectomy N/A 03/20/2013    Procedure: THORACIC LAMINECTOMY FOR TUMOR;  Surgeon: Reinaldo Meeker, MD;  Location: [facility];  Service: Neurosurgery;  Laterality: N/A;  Thoracic Laminectomy for Thoracic Ten Tumor, Pedicle Screws at Thoracic Nine through Eleven   Social History:  reports that he has never smoked. He does not have any smokeless tobacco history on file. He reports that he does not drink alcohol or use illicit drugs.  Family / Support Systems Marital Status: Married How Long?: 49 yrs Patient Roles: Spouse;Parent Spouse/Significant Other: wife, Ashtin Rosner @ [phone] or [phone] Children: daughter, Bertram Gala @ [phone] or [phone] Other Supports: sons, Ladene Artist (paraplegia)   and Alinda Money - living in [place] and works at [employer]: wife (Has 2 sons and 1 dtr.) Ability/Limitations of Caregiver: Wife works, but she can take Medical laboratory scientific officer Availability: 24/7 Family Dynamics: wife very supportive as are other local relatives  Social History Preferred language: English Religion: None Cultural Background: NA Education: HS Read: Yes Write: Yes Employment Status: Retired Date Retired/Disabled/Unemployed: 1998 after meningioma  Fish farm manager Issues: none Guardian/Conservator: none    Abuse/Neglect Physical Abuse: Denies Verbal Abuse: Denies Sexual Abuse: Denies Exploitation of patient/patient's resources: Denies Self-Neglect: Denies  Emotional Status Pt's affect, behavior adn adjustment status: Pt very optimistic about his recovery - pleasant, talkative and humorous throughout interview.  Joking around alot with wife and his sister during conversation.  Pt denies any significant emotional distress, especially after receiving word from MD that his tumor was benign.   Recent Psychosocial Issues: None recent -  Pyschiatric History: none Substance Abuse History: none  Patient / Family Perceptions, Expectations & Goals Pt/Family understanding of illness & functional limitations: pt and wife with good, general understanding of tumor, surgery and current functional limitiations.   Premorbid pt/family roles/activities: Pt moving about with a walker PTA with declining function, however, was still independent at home.   Anticipated changes in roles/activities/participation: Wife may need to take FMLA from work and provide primary care initially. Pt/family expectations/goals: "I want to get stronger"  [employer]: None Premorbid Home Care/DME Agencies: None Transportation available at discharge: yes  Discharge Planning Living Arrangements: Spouse/significant other Support Systems: Stage manager;Other relatives Type of Residence: Private residence Insurance Resources: [employer] Resources: Restaurant manager, fast food Screen Referred: No Living Expenses: Database administrator Management: Spouse Do you have any problems obtaining your medications?: No Home Management: pt and wife Patient/Family Preliminary Plans: pt to return home with wife as primary support - other family also to assist intermittently Social Work Anticipated Follow Up Needs: HH/OP Expected length of stay: 2.5 weeks  Clinical Impression Very pleasant,  talkative gentleman here after spinal tumor removal (benign).  Pleased with progress to date.  Denies any  significant emotional distress - will monitor.  Kha Hari 03/31/2013, 12:36 PM

## 2013-03-31 NOTE — Progress Notes (Signed)
Physical Therapy Session Note  Patient Details  Name: Anthony Walls MRN: [id number] Date of Birth: 05/10/1941  Today's Date: 03/31/2013 Time: 4098-1191 Time Calculation (min): 42 min  Short Term Goals: Week 1:  PT Short Term Goal 1 (Week 1): Pt will ambulate x 25' with min-guard assist and LRAD PT Short Term Goal 2 (Week 1): Pt will transfer bed <> wheelchair with supervision PT Short Term Goal 3 (Week 1): Pt will tolerate 3 min continuous standing activity with no knee hyperextension or knee buckling.   Skilled Therapeutic Interventions/Progress Updates:    This session focused on gait with RW> 250' with min guard assist.  Chair to follow, but not needed to encourage increased gait distance.  Pt with 1-2 small LOB while managing obstacles in the hallway.  Verbal cues for upright posture and to stay closer to RW during gait.  Stairs with left hand rail min guard assist, reciprocal pattern.  Uncontrolled descent when coming down 6" step.  Educated wife re: where to guard him on the stairs.  Pt was able to report 3/3 back precautions and that he was not supposed to lift anything (but could not report how much weight or give an example of something that he could not lift).  Reinforced functional education of back precautions.  Bed mobility and transfers in ADL apartment with no assist needed for bed mobility.  He does use the headboard on the bed for support during transitions and rolling.   Pt was also able to show donning and doffing of the spinal brace without cueing needed. Car transfer with supervision, verbal cues for safety using RW, going slow (not bouncing when he sits down into the car seat).  Session terminated early due to pt had not eaten yet and lunch arrived to his room.  Missed time 12 min.    Therapy Documentation Precautions:  Precautions Precautions: Fall;Back Precaution Comments: reviewed 3/3 back precautions as patient unable to recall any again Required Braces or Orthoses:  Spinal Brace Spinal Brace: Lumbar corset;Applied in sitting position Restrictions Weight Bearing Restrictions: No General: Amount of Missed PT Time (min): 18 Minutes Missed Time Reason: Other (comment) (lunch came)   Pain: Pain Assessment Pain Assessment: No/denies pain   Locomotion : Ambulation Ambulation/Gait Assistance: 4: Min assist   See FIM for current functional status  Therapy/Group: Individual Therapy  [name], PT, DPT [phone]   03/31/2013, 1:45 PM

## 2013-03-31 NOTE — Plan of Care (Signed)
Problem: SCI BLADDER ELIMINATION Goal: RH STG MANAGE BLADDER WITH ASSISTANCE STG Manage Bladder With min Assistance  Outcome: Not Progressing Continued need for I/O caths.  Goal: RH STG MANAGE BLADDER WITH EQUIPMENT WITH ASSISTANCE STG Manage Bladder With Equipment With min Assistance  Outcome: Not Progressing Continued need for I/O caths  Problem: RH PAIN MANAGEMENT Goal: RH STG PAIN MANAGED AT OR BELOW PT'S PAIN GOAL Pain < 3.  Outcome: Progressing No complaints of pain

## 2013-03-31 NOTE — Progress Notes (Signed)
Occupational Therapy Session Note  Patient Details  Name: Anthony Walls MRN: [id number] Date of Birth: [birth year]  Today's Date: 03/31/2013  Session 1 Time: 0930-1030 Time Calculation (min): 60 min  Short Term Goals: Week 1:  OT Short Term Goal 1 (Week 1): Short Term Goals=Long Term Goals  Skilled Therapeutic Interventions/Progress Updates:    Pt engaged in bathing and dressing tasks in room.  Wife present to observe.  Pt required min encouragement to bathe and change clothing.  Wife provided encouragement.  Pt amb with RW to bathroom for shower.  Pt required verbal cues for safety awareness (pt attempted to stand with lumbar corset in place.) Pt used long handle sponge to bathe feet.  Pt completed dressing with sit<>stand from EOB and again required verbal cues to not stand until lumbar corset donned.  Pt used sock aid to don socks.  Pt verbalized 3/3 back precautions and stated that a therapist over the weekend added "no lifting."  Pt continues to require min verbal cues for safety awareness.  Focus on activity tolerance, dynamic standing balance, functional ambulation with RW, and safety awareness.   Therapy Documentation Precautions:  Precautions Precautions: Fall;Back Precaution Comments: reviewed 3/3 back precautions as patient unable to recall any again Required Braces or Orthoses: Spinal Brace Spinal Brace: Lumbar corset;Applied in sitting position Restrictions Weight Bearing Restrictions: No Pain: Pain Assessment Pain Assessment: No/denies pain See FIM for current functional status  Therapy/Group: Individual Therapy  Session 2 Time: 1430-1500 Pt c/o 5/10 pain in mid/lower back; RN aware and admin meds during therapy session Individual Therapy  Pt amb with RW to ADL apartment to practice simple kitchen tasks at ambulatory level with RW.  Pt issued walker bag to use for carrying objects.  Pt also stated he has something similar at home to use.  I encouraged patient to  use it at home.  Pt amb with RW to bathroom to practice stepping over into tub.  Pt practiced X 4 and required asssitance with lifting left foot X 1.  Encouraged patient to listen to wife when she offers advice regarding safety at home.  Wife present to observe and offer appropriate supervision throughout session.  Lavone Neri [facility] 03/31/2013, 11:14 AM

## 2013-04-01 ENCOUNTER — Inpatient Hospital Stay (HOSPITAL_COMMUNITY): Payer: Medicare Other

## 2013-04-01 ENCOUNTER — Inpatient Hospital Stay (HOSPITAL_COMMUNITY): Payer: Medicare Other | Admitting: Physical Therapy

## 2013-04-01 DIAGNOSIS — C720 Malignant neoplasm of spinal cord: Secondary | ICD-10-CM

## 2013-04-01 DIAGNOSIS — G8220 Paraplegia, unspecified: Secondary | ICD-10-CM

## 2013-04-01 DIAGNOSIS — N319 Neuromuscular dysfunction of bladder, unspecified: Secondary | ICD-10-CM

## 2013-04-01 DIAGNOSIS — K592 Neurogenic bowel, not elsewhere classified: Secondary | ICD-10-CM

## 2013-04-01 LAB — CBC
HCT: 22.8 % — ABNORMAL LOW (ref 39.0–52.0)
Hemoglobin: 7.4 g/dL — ABNORMAL LOW (ref 13.0–17.0)
MCV: 87.4 fL (ref 78.0–100.0)
RBC: 2.61 MIL/uL — ABNORMAL LOW (ref 4.22–5.81)
WBC: 7.8 10*3/uL (ref 4.0–10.5)

## 2013-04-01 NOTE — Progress Notes (Signed)
0505- Pt scan at 0030 for 164. Pt voided 200 ml yellow urine after midnight. Scan for 296 at 0300 with no void. Attempt to coude cath pt x 1 at 0505 unsuccessful. Pt tolerated well. Will report to oncoming nurse.

## 2013-04-01 NOTE — Progress Notes (Signed)
Subjective/Complaints:  much better night. Less restless. Concerned about some drainage from back A 12 point review of systems has been performed and if not noted above is otherwise negative.   Objective: Vital Signs: Blood pressure 129/70, pulse 56, temperature 98.3 F (36.8 C), temperature source Oral, resp. rate 20, height 5\' 4"  (1.626 m), weight 94.3 kg (207 lb 14.3 oz), SpO2 98.00%. No results found.  Recent Labs  03/31/13 0500 04/01/13 0610  WBC 6.9 7.8  HGB 7.5* 7.4*  HCT 22.3* 22.8*  PLT 336 337    Recent Labs  03/31/13 0500  NA 134*  K 4.4  CL 102  GLUCOSE 120*  BUN 33*  CREATININE 1.51*  CALCIUM 8.8   CBG (last 3)  No results found for this basename: GLUCAP,  in the last 72 hours  Wt Readings from Last 3 Encounters:  03/25/13 94.3 kg (207 lb 14.3 oz)  03/20/13 95.1 kg (209 lb 10.5 oz)  03/20/13 95.1 kg (209 lb 10.5 oz)    Physical Exam:  HENT:  Head: Normocephalic and atraumatic.  Edentulous  Eyes: Pupils are equal, round, and reactive to light.  Neck: Normal range of motion.  Cardiovascular: Normal rate and regular rhythm. No murmurs  Pulmonary/Chest: Effort normal and breath sounds normal. No wheezes or rales  Abdominal: He exhibits minimal dstension. Bowel sounds are present There is no tenderness.  Musculoskeletal: He exhibits no edema and no tenderness.  Neurological: He is alert and oriented to person, place, and time.  LLE weakness with ataxia. Decreased sensation LLE greater than RLE to propioception. Only mild LT/PP sensation loss. LE strength 2 to 2+/5 prox to 3+ to 4/5 distally  Skin: Skin is warm and dry.  Back incision clean with minimal serosanginous drainage from lower end of wound. Well approx.   Assessment/Plan: 1. 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 Physiatrist is providing  close team supervision and 24 hour management of active medical problems listed below. Physiatrist and rehab team continue to assess barriers to discharge/monitor patient progress toward functional and medical goals. FIM: FIM - Bathing Bathing Steps Patient Completed: Chest;Right Arm;Left Arm;Abdomen;Front perineal area;Left lower leg (including foot);Right lower leg (including foot);Right upper leg;Left upper leg Bathing: 4: Min-Patient completes 8-9 78f 10 parts or 75+ percent  FIM - Upper Body Dressing/Undressing Upper body dressing/undressing steps patient completed: Thread/unthread right sleeve of pullover shirt/dresss;Thread/unthread left sleeve of pullover shirt/dress;Put head through opening of pull over shirt/dress;Pull shirt over trunk Upper body dressing/undressing: 5: Set-up assist to: Obtain clothing/put away FIM - Lower Body Dressing/Undressing Lower body dressing/undressing steps patient completed: Thread/unthread right pants leg;Thread/unthread left pants leg;Pull pants up/down;Fasten/unfasten pants;Don/Doff right sock;Don/Doff left sock Lower body dressing/undressing: 4: Min-Patient completed 75 plus % of tasks  FIM - Toileting Toileting steps completed by patient: Adjust clothing prior to toileting;Adjust clothing after toileting Toileting Assistive Devices: Grab bar or rail for support Toileting: 3: Mod-Patient completed 2 of 3 steps  FIM - Diplomatic Services operational officer Devices: Grab bars;Walker Toilet Transfers: 5-To toilet/BSC: Supervision (verbal cues/safety issues);5-From toilet/BSC: Supervision (verbal cues/safety issues)  FIM - Banker Devices: Walker;Arm rests Bed/Chair Transfer: 6: Supine > Sit: No assist;6: Sit > Supine: No assist;5: Bed > Chair or W/C: Supervision (verbal cues/safety issues);5: Chair or W/C > Bed: Supervision (verbal cues/safety issues)  FIM - Locomotion: Wheelchair Distance:  150 Locomotion: Wheelchair: 0: Activity did  not occur FIM - Locomotion: Ambulation Locomotion: Ambulation Assistive Devices: Walker - Rolling Ambulation/Gait Assistance: 4: Min assist Locomotion: Ambulation: 4: Travels 150 ft or more with minimal assistance (Pt.>75%)  Comprehension Comprehension Mode: Auditory Comprehension: 6-Follows complex conversation/direction: With extra time/assistive device  Expression Expression Mode: Verbal Expression: 7-Expresses complex ideas: With no assist  Social Interaction Social Interaction: 7-Interacts appropriately with others - No medications needed.  Problem Solving Problem Solving: 7-Solves complex problems: Recognizes & self-corrects  Memory Memory: 7-Complete Independence: No helper  Medical Problem List and Plan:  1. DVT Prophylaxis/Anticoagulation: Mechanical: Antiembolism stockings, knee (TED hose) Bilateral lower extremities  Sequential compression devices, below knee Bilateral lower extremities  2. OA/ Pain Management: prn medications effective.  3. Mood: Motivated to get better.  4. Neuropsych: This patient is capable of making decisions on his/her own behalf.  5. HTN: Monitor with bid checks. Continue cozaar. Encourage po fluid intake.  6. ABLA: added iron supplement. hgb holding around 7.5. Follow serially.Hennie Duos thursday 7.Constipation: results with SSE yesterday. Daily regimen with miralax. Added fibercon 8. 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 Continue flomax. UA unremarkable, ucx negative  -added urecholine which is helping--increased to qid  -continue I and O cath prn  -needs to be at EOB when voiding, double voiding 9. Mild renal insufficiency- push fluids. BUN/CR stable today  -improved recheck this week. LOS (Days) 7 A FACE TO FACE EVALUATION WAS PERFORMED  Cashis Rill T 04/01/2013 8:33 AM

## 2013-04-01 NOTE — Progress Notes (Signed)
Occupational Therapy Note  Patient Details  Name: Anthony Walls MRN: [id number] Date of Birth: Mar 09, 1941 Today's Date: 04/01/2013  Time: 1400-1430 Pt denies pain Individual Therapy  Pt seated in w/c upon arrival with wife present.  Pt amb with RW to ADL apartment to practice tub transfers and home mgmt tasks including making bed.  Pt performed all tasks at supervision level.  Discussed energy conservation and home safety with wife and patient.   Lavone Neri [facility] 04/01/2013, 3:33 PM

## 2013-04-01 NOTE — Progress Notes (Signed)
Occupational Therapy Session Note  Patient Details  Name: Anthony Walls MRN: [id number] Date of Birth: [birth year]  Today's Date: 04/01/2013 Time: 1004-1100 Time Calculation (min): 56 min  Short Term Goals: Week 1:  OT Short Term Goal 1 (Week 1): Short Term Goals=Long Term Goals  Skilled Therapeutic Interventions/Progress Updates:    Pt worked on bathing and dressing during session.  Ambulated to walk-in shower with min guard assist and use of the RW.  Pt able to bathe sit to stand with use of grab bar.  He did not attempt washing the lower legs even though he has a long handle sponge present.  Performed LB dressing in the shower.  Pt did not attempt using the reacher to donn his shorts but instead attempted reaching down, even though he was cued to use the reacher.  For donning the socks he used the sockaide.  Discussed using AE for LB selfcare including reacher, LH shoe horn.  Wife present for session and also educated on AE use.    Therapy Documentation Precautions:  Precautions Precautions: Fall;Back Precaution Comments:    Required Braces or Orthoses: Spinal Brace Spinal Brace: Lumbar corset;Applied in sitting position Restrictions Weight Bearing Restrictions: No  Pain: Pain Assessment Pain Assessment: No/denies pain Pain Score: 0-No pain ADL: See FIM for current functional status  Therapy/Group: Individual Therapy  Tijuana Scheidegger OTR/L Pager number [phone] 04/01/2013, 12:17 PM

## 2013-04-01 NOTE — Progress Notes (Signed)
Physical Therapy Session Note  Patient Details  Name: Anthony Walls MRN: [id number] Date of Birth: 03/16/1941  Today's Date: 04/01/2013 Time: Session #1:  [id number], Session #2: 1305-1400 Time Calculation (min): Session #1: 48 min, Session #2: 55 min  Short Term Goals: Week 1:  PT Short Term Goal 1 (Week 1): Pt will ambulate x 25' with min-guard assist and LRAD PT Short Term Goal 2 (Week 1): Pt will transfer bed <> wheelchair with supervision PT Short Term Goal 3 (Week 1): Pt will tolerate 3 min continuous standing activity with no knee hyperextension or knee buckling.   Skilled Therapeutic Interventions/Progress Updates:    Session #1: Gait with RW x 200' with supervision up to min assist with fatigue.  Gait on stairs with bil rails min assist step to pattern 5 steps x 2 4-6" in height.  Step up to strengthen legs x 10 each bil 4" and 6" high using bil rails for support.  NuStep level 6 x 8 mins legs only.  Mat mobility sit to side lying right supervision.  Mat exercises: ankle pumps, quad sets, SAQs, hip abduction, heel slides, bent knee marches x 10 each bil.  Rolling right supervision right side lying to sit supervision.    Session #2: This session focused on gait with RW 300' with supervision and WC to follow to encourage increased gait distance over tiled and carpeted surfaces.  Gait/balance activities at railing in hallway: side stepping, retro gait, marches x 30' x 2 each.  Balance in parallel bars: 3 cone taps, alternating single cone taps, standing on foam feet apart, feet together, eyes open and eyes closed multiple trials.  Biodex postural stability and LOS ~2 mins each.    Therapy Documentation Precautions:  Precautions Precautions: Fall;Back Precaution Comments: reviewed 3/3 back precautions as patient unable to recall any again Required Braces or Orthoses: Spinal Brace Spinal Brace: Lumbar corset;Applied in sitting position Restrictions Weight Bearing Restrictions: No    Pain: Pain Assessment Pain Assessment: No/denies pain Pain Score: 0-No pain   Locomotion : Ambulation Ambulation/Gait Assistance: 4: Min assist   See FIM for current functional status  Therapy/Group: Individual Therapy  Lurena Joiner B. Jasai Sorg, PT, DPT [phone]   04/01/2013, 9:58 AM

## 2013-04-02 ENCOUNTER — Inpatient Hospital Stay (HOSPITAL_COMMUNITY): Payer: Medicare Other

## 2013-04-02 ENCOUNTER — Inpatient Hospital Stay (HOSPITAL_COMMUNITY): Payer: Medicare Other | Admitting: Physical Therapy

## 2013-04-02 DIAGNOSIS — G8220 Paraplegia, unspecified: Secondary | ICD-10-CM

## 2013-04-02 DIAGNOSIS — C720 Malignant neoplasm of spinal cord: Secondary | ICD-10-CM

## 2013-04-02 DIAGNOSIS — N319 Neuromuscular dysfunction of bladder, unspecified: Secondary | ICD-10-CM

## 2013-04-02 DIAGNOSIS — K592 Neurogenic bowel, not elsewhere classified: Secondary | ICD-10-CM

## 2013-04-02 MED ORDER — CARBAMIDE PEROXIDE 6.5 % OT SOLN
10.0000 [drp] | Freq: Two times a day (BID) | OTIC | Status: DC
  Administered 2013-04-02 – 2013-04-04 (×4): 10 [drp] via OTIC
  Filled 2013-04-02: qty 15

## 2013-04-02 NOTE — Progress Notes (Addendum)
Physical Therapy Session Note  Patient Details  Name: Anthony Walls MRN: [id number] Date of Birth: 07-06-1941  Today's Date: 04/02/2013 Time: 1030-1125 Time Calculation (min): 55 min  Short Term Goals: Week 2:   = long term goals  Skilled Therapeutic Interventions/Progress Updates:    Ambulation x 150' with RW and supervision, 250' with RW and wife providing min-guard assist/supervision while pt practicing gait challenges such as head turns, speed changes, and backwards walking (x 40'). Cues needed particularly with walking backwards for appropriate positioning and negotiation of RW for safety. Car transfer x 1 with supervision. Obstacle course with stepping over obstacles, negotiating cones, and ascending/descending steps performed with RW (2 rails for steps) and supervision provided by wife. PT demonstrated positioning for supervision from wife for 5 steps, the couple practiced with another 5 steps and needed min cues for safety.  Squats for bil. LE strengthening and control of bil. Knees into full extension while avoiding hyperextension. Standing balance activity on compliant surface with small ball toss and cognitive challenge, nearly constant min assist for balance. Discussed need for supervision from wife with all ambulation, pt and wife verbalize understanding and risk of falls.   Therapy Documentation Precautions:  Precautions Precautions: Fall;Back Precaution Comments:    Required Braces or Orthoses: Spinal Brace Spinal Brace: Lumbar corset;Applied in sitting position Restrictions Weight Bearing Restrictions: No Pain: Pain Assessment Pain Assessment: No/denies pain  See FIM for current functional status  Therapy/Group: Individual Therapy  Wilhemina Bonito 04/02/2013, 11:41 AM

## 2013-04-02 NOTE — Progress Notes (Signed)
Occupational Therapy Session Note  Patient Details  Name: Anthony Walls MRN: [id number] Date of Birth: 04-Jul-1941  Today's Date: 04/02/2013 Time: 0900-1000 Time Calculation (min): 60 min  Short Term Goals: Week 1:  OT Short Term Goal 1 (Week 1): Short Term Goals=Long Term Goals  Skilled Therapeutic Interventions/Progress Updates:    Pt engaged in bathing at shower level and dressing with sit<>stand from w/c.  Pt's wife present and provided appropriate level of supervision.  Discussed with patient the importance of allowing his wife to provide supervision for him at home and to remind him of back precautions.  Pt verbalized understanding.  Pt used AE (long handle sponge, reacher, and sock aid) to assist with bathing and dressing.  Pt continues to required min verbal cues for safety.  Pt amb with RW to ADL apartment to practice stepping into tub with supervision. Focus on continued family education, funcitonal ambulation with RW, activity tolerance, and safety awareness.  Therapy Documentation Precautions:  Precautions Precautions: Fall;Back Precaution Comments:    Required Braces or Orthoses: Spinal Brace Spinal Brace: Lumbar corset;Applied in sitting position Restrictions Weight Bearing Restrictions: No   Pain: Pain Assessment Pain Assessment: No/denies pain  See FIM for current functional status  Therapy/Group: Individual Therapy  Rich Brave 04/02/2013, 10:07 AM

## 2013-04-02 NOTE — Progress Notes (Signed)
Subjective/Complaints:  still some drainage from wound. Pain controlled. Emptying better.  A 12 point review of systems has been performed and if not noted above is otherwise negative.   Objective: Vital Signs: Blood pressure 136/61, pulse 78, temperature 98.4 F (36.9 C), temperature source Oral, resp. rate 20, height 5\' 4"  (1.626 m), weight 92.2 kg (203 lb 4.2 oz), SpO2 99.00%. No results found.  Recent Labs  03/31/13 0500 04/01/13 0610  WBC 6.9 7.8  HGB 7.5* 7.4*  HCT 22.3* 22.8*  PLT 336 337    Recent Labs  03/31/13 0500  NA 134*  K 4.4  CL 102  GLUCOSE 120*  BUN 33*  CREATININE 1.51*  CALCIUM 8.8   CBG (last 3)  No results found for this basename: GLUCAP,  in the last 72 hours  Wt Readings from Last 3 Encounters:  04/01/13 92.2 kg (203 lb 4.2 oz)  03/20/13 95.1 kg (209 lb 10.5 oz)  03/20/13 95.1 kg (209 lb 10.5 oz)    Physical Exam:  HENT:  Head: Normocephalic and atraumatic.  Edentulous  Eyes: Pupils are equal, round, and reactive to light.  Neck: Normal range of motion.  Cardiovascular: Normal rate and regular rhythm. No murmurs  Pulmonary/Chest: Effort normal and breath sounds normal. No wheezes or rales  Abdominal: He exhibits minimal dstension. Bowel sounds are present There is no tenderness.  Musculoskeletal: He exhibits no edema and no tenderness.  Neurological: He is alert and oriented to person, place, and time.  LLE weakness with ataxia. Decreased sensation LLE greater than RLE to propioception. Only mild LT/PP sensation loss. LE strength 2 to 2+/5 prox to 3+ to 4/5 distally  Skin: Skin is warm and dry.  Back incision clean with minimal serosanginous drainage from lower end of wound. Well approx.   Assessment/Plan: 1. 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 Physiatrist is providing close team  supervision and 24 hour management of active medical problems listed below. Physiatrist and rehab team continue to assess barriers to discharge/monitor patient progress toward functional and medical goals. FIM: FIM - Bathing Bathing Steps Patient Completed: Chest;Right Arm;Abdomen;Left Arm;Front perineal area;Buttocks;Right upper leg;Left upper leg Bathing: 4: Steadying assist  FIM - Upper Body Dressing/Undressing Upper body dressing/undressing steps patient completed: Thread/unthread right sleeve of pullover shirt/dresss;Thread/unthread left sleeve of pullover shirt/dress;Put head through opening of pull over shirt/dress;Pull shirt over trunk Upper body dressing/undressing: 5: Supervision: Safety issues/verbal cues FIM - Lower Body Dressing/Undressing Lower body dressing/undressing steps patient completed: Thread/unthread right pants leg;Thread/unthread left pants leg;Don/Doff left sock;Don/Doff right sock Lower body dressing/undressing: 4: Min-Patient completed 75 plus % of tasks  FIM - Toileting Toileting steps completed by patient: Adjust clothing prior to toileting;Adjust clothing after toileting Toileting Assistive Devices: Grab bar or rail for support Toileting: 3: Mod-Patient completed 2 of 3 steps  FIM - Diplomatic Services operational officer Devices: Grab bars;Walker Toilet Transfers: 5-To toilet/BSC: Supervision (verbal cues/safety issues);5-From toilet/BSC: Supervision (verbal cues/safety issues)  FIM - Banker Devices: Walker;Arm rests Bed/Chair Transfer: 6: Supine > Sit: No assist;6: Sit > Supine: No assist;5: Bed > Chair or W/C: Supervision (verbal cues/safety issues);5: Chair or W/C > Bed: Supervision (verbal cues/safety issues)  FIM - Locomotion: Wheelchair Distance: 150 Locomotion: Wheelchair: 0: Activity did not occur FIM - Locomotion: Ambulation Locomotion: Ambulation Assistive Devices: Designer, industrial/product Ambulation/Gait  Assistance: 4: Min assist Locomotion: Ambulation: 4:  Travels 150 ft or more with minimal assistance (Pt.>75%)  Comprehension Comprehension Mode: Auditory Comprehension: 6-Follows complex conversation/direction: With extra time/assistive device  Expression Expression Mode: Verbal Expression: 7-Expresses complex ideas: With no assist  Social Interaction Social Interaction: 7-Interacts appropriately with others - No medications needed.  Problem Solving Problem Solving: 7-Solves complex problems: Recognizes & self-corrects  Memory Memory: 7-Complete Independence: No helper  Medical Problem List and Plan:  1. DVT Prophylaxis/Anticoagulation: Mechanical: Antiembolism stockings, knee (TED hose) Bilateral lower extremities  Sequential compression devices, below knee Bilateral lower extremities  2. OA/ Pain Management: prn medications effective.  3. Mood: Motivated to get better.  4. Neuropsych: This patient is capable of making decisions on his/her own behalf.  5. HTN: Monitor with bid checks. Continue cozaar. Encourage po fluid intake.  6. ABLA: added iron supplement. hgb holding around 7.5. Follow serially.Hennie Duos thursday 7.Constipation: results with SSE yesterday. Daily regimen with miralax. Added fibercon 8. 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 Continue flomax.    -added urecholine which is helping--increased to qid  -not requiring I and O caths. Discussed with pt and wife   -needs to be at EOB when voiding, double voiding 9. Mild renal insufficiency- push fluids. BUN/CR stablizing  -improved-recheck tomorrow LOS (Days) 8 A FACE TO FACE EVALUATION WAS PERFORMED  Ronel Rodeheaver T 04/02/2013 8:55 AM

## 2013-04-02 NOTE — Progress Notes (Signed)
Social Work Patient ID: Anthony Walls, male   DOB: March 10, 1941, 72 y.o.   MRN: [id number]   Inpatient RehabilitationTeam Conference and Plan of Care Update Date: 04/01/2013   Time: 2:20 PM     Patient Name: Anthony Walls       Medical Record Number: [id number]   Date of Birth: [birth year] Sex: Male         Room/Bed: 4155/4155-01 Payor Info: Payor: MEDICARE  Plan: MEDICARE PART A AND B  Product Type: *No Product type*    Admitting Diagnosis: T10 TUMOR RESECTON   Admit Date/Time:  03/25/2013  6:53 PM Admission Comments: No comment available   Primary Diagnosis:  <principal problem not specified> Principal Problem: <principal problem not specified>    Patient Active Problem List     Diagnosis  Date Noted   .  Thoracic spine tumor  03/26/2013   .  Compression of spinal cord with myelopathy  03/26/2013   .  Acute blood loss anemia  03/26/2013   .  HTN (hypertension)  03/26/2013   .  Renal insufficiency  03/26/2013     Expected Discharge Date: Expected Discharge Date: 04/04/13  Team Members Present: Physician leading conference: Dr. Faith Rogue Social Worker Present: Amada Jupiter, LCSW Nurse Present:  Jenetta Downer, RN) PT Present: [name], PT Corinna Capra., PT) OT Present: Mackie Pai, OT;Patricia Mat Carne, OT Other (Discipline and Name): Bayard Hugger, RN;  Ottie Glazier, RN;  Tora Duck, RN        Current Status/Progress  Goal  Weekly Team Focus   Medical     plasmacytoma with cord compression, deconditioning  pain mgt, bladder and bowel issues  wound care, anemia mgt   Bowel/Bladder     pt continent of bowel uses urinal occasionally. Has to be  I/O with coude cath 20F q shift.  will become continent of B/B.  continent of bowel mod assist with bladder.   Swallow/Nutrition/ Hydration            ADL's     min A/supervision overall  supervision overall  safety awareness, activity tolerance; family educaiton   Mobility     supervision for bed mobility, min assist at  times for small LOB during gait >250' with RW.  Stairs min assist with railing.  Pt able to report back precautions, but needs reinforcement of functional situations where he may break them.  HHPT and RW for discharge.    supervision for all mobility, mod I for WC   gait, leg strength, balance, safety education, stairs   Communication            Safety/Cognition/ Behavioral Observations           Pain     no c/o pain or discomfort.   less than equal to 3.  will assess q shift will medicate as needed.   Skin     incision to mid back thoracic spine. One steristrip CDI 2x2 guaze above that for small amount of serous drainange.   will remain free of skin infection/breakdown  will assess and monitor q shift.     Rehab Goals Patient on target to meet rehab goals: Yes *See Care Plan and progress notes for long and short-term goals.    Barriers to Discharge:  pain, medical      Possible Resolutions to Barriers:    finalize medical mgt, dc planning      Discharge Planning/Teaching Needs:    Home with wife who can provide 24/7 supervision  Team Discussion:    Excellent gains and reaching supervision to mod i goals   Revisions to Treatment Plan:    None    Continued Need for Acute Rehabilitation Level of Care: The patient requires daily medical management by a physician with specialized training in physical medicine and rehabilitation for the following conditions: Daily direction of a multidisciplinary physical rehabilitation program to ensure safe treatment while eliciting the highest outcome that is of practical value to the patient.: Yes Daily medical management of patient stability for increased activity during participation in an intensive rehabilitation regime.: Yes Daily analysis of laboratory values and/or radiology reports with any subsequent need for medication adjustment of medical intervention for : Neurological problems;Other;Post surgical problems  Anthony Walls 04/02/2013,  10:48 AM

## 2013-04-02 NOTE — Progress Notes (Signed)
Social Work Patient ID: Anthony Walls, male   DOB: 23-Dec-1940, 72 y.o.   MRN: [id number]  Met with patient and wife this afternoon to review team conference.  Both aware and agreeable with targeted d/c 5/2 at supervision to mod i goals.  Need to clarify follow up recommendations with therapies.  Cont. To follow.  Haron Beilke, LCSW

## 2013-04-02 NOTE — Plan of Care (Signed)
Problem: RH SKIN INTEGRITY Goal: RH STG ABLE TO PERFORM INCISION/WOUND CARE W/ASSISTANCE STG Able To Perform Incision/Wound Care With min Assistance.  Outcome: Progressing Wife has

## 2013-04-02 NOTE — Progress Notes (Signed)
Physical Therapy Session Note  Patient Details  Name: Layman Gully MRN: [id number] Date of Birth: 1940/12/23  Today's Date: 04/02/2013 Time: 1300-1400 Time Calculation (min): 60 min  Short Term Goals: Week 2:    Long term goals  Skilled Therapeutic Interventions/Progress Updates:    Pt participated in group therapy walking group with activities below: - Ambulation x 320' with RW and wife providing supervision for distance and endurance. Also practiced turns with RW and backwards ambulation with mod verbal cues for sequencing of stepping and progression of RW (tends to leave RW too far ahead of self/move RW while stepping). Discussed avoiding sit <> stands from low surfaces at home.  - NuStep level 4 x 10 min for endurance - Standing balance with ball toss with other participants working on balance without UE support + cognitive distraction, overall min assist.   Therapy Documentation Precautions:  Precautions Precautions: Fall;Back Precaution Comments:    Required Braces or Orthoses: Spinal Brace Spinal Brace: Lumbar corset;Applied in sitting position Restrictions Weight Bearing Restrictions: No Pain: Pain Assessment Pain Assessment: No/denies pain  See FIM for current functional status  Therapy/Group: Group Therapy  Sherrine Maples Cheek 04/02/2013, 4:11 PM

## 2013-04-02 NOTE — Plan of Care (Signed)
Problem: SCI BLADDER ELIMINATION Goal: RH STG MANAGE BLADDER WITH EQUIPMENT WITH ASSISTANCE STG Manage Bladder With Equipment With min Assistance  Outcome: Not Progressing ttoal assist for staff to cath, patient unable to reach for self cath, wife states" I can not do this"

## 2013-04-02 NOTE — Progress Notes (Signed)
Physical Therapy Session Note  Patient Details  Name: Anthony Walls MRN: [id number] Date of Birth: [birth year]  Today's Date: 04/02/2013 Time: 1530-1600 Time Calculation (min): 30 min  Skilled Therapeutic Interventions/Progress Updates:    Session focused on gait to/from therapy with RW with overall S and strengthening of UEs and LEs. Cues needed to lock brakes and remove legrests for initial transfer. UE therex using 4# straight weight for biceps curls, overhead press within modified range for back precautions, and forward press x 2 sets of 10 reps each. Lateral steps up for LE strengthening with min A to R and L x 10 reps each.   Therapy Documentation Precautions:  Precautions Precautions: Fall;Back Precaution Comments:    Required Braces or Orthoses: Spinal Brace Spinal Brace: Lumbar corset;Applied in sitting position Restrictions Weight Bearing Restrictions: No  Pain: Pain Assessment Pain Assessment: No/denies pain  See FIM for current functional status  Therapy/Group: Individual Therapy  [name] [facility] 04/02/2013, 4:15 PM

## 2013-04-02 NOTE — Patient Care Conference (Signed)
Inpatient RehabilitationTeam Conference and Plan of Care Update Date: 04/01/2013   Time: 2:20 PM    Patient Name: Anthony Walls      Medical Record Number: [id number]  Date of Birth: Nov 30, 1941 Sex: Male         Room/Bed: 4155/4155-01 Payor Info: Payor: MEDICARE  Plan: MEDICARE PART A AND B  Product Type: *No Product type*     Admitting Diagnosis: T10 TUMOR RESECTON  Admit Date/Time:  03/25/2013  6:53 PM Admission Comments: No comment available   Primary Diagnosis:  <principal problem not specified> Principal Problem: <principal problem not specified>  Patient Active Problem List   Diagnosis Date Noted  . 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 Acute blood loss anemia 03/26/2013  . HTN (hypertension) 03/26/2013  . Renal insufficiency 03/26/2013    Expected Discharge Date: Expected Discharge Date: 04/04/13  Team Members Present: Physician leading conference: Dr. Faith Rogue Social Worker Present: Amada Jupiter, LCSW Nurse Present:  Jenetta Downer, RN) PT Present: [name], PT Corinna Capra., PT) OT Present: Mackie Pai, OT;Patricia Mat Carne, OT Other (Discipline and Name): Bayard Hugger, RN;  Ottie Glazier, RN;  Tora Duck, RN     Current Status/Progress Goal Weekly Team Focus  Medical   plasmacytoma with cord compression, deconditioning  pain mgt, bladder and bowel issues  wound care, anemia mgt   Bowel/Bladder   pt continent of bowel uses urinal occasionally. Has to be  I/O with coude cath 80F q shift.  will become continent of B/B.  continent of bowel mod assist with bladder.   Swallow/Nutrition/ Hydration             ADL's   min A/supervision overall  supervision overall  safety awareness, activity tolerance; family educaiton   Mobility   supervision for bed mobility, min assist at times for small LOB during gait >250' with RW.  Stairs min assist with railing.  Pt able to report back precautions, but needs  reinforcement of functional situations where he may break them.  HHPT and RW for discharge.    supervision for all mobility, mod I for WC   gait, leg strength, balance, safety education, stairs   Communication             Safety/Cognition/ Behavioral Observations            Pain   no c/o pain or discomfort.   less than equal to 3.  will assess q shift will medicate as needed.   Skin   incision to mid back thoracic spine. One steristrip CDI 2x2 guaze above that for small amount of serous drainange.   will remain free of skin infection/breakdown  will assess and monitor q shift.     Rehab Goals Patient on target to meet rehab goals: Yes *See Care Plan and progress notes for long and short-term goals.  Barriers to Discharge: pain, medical    Possible Resolutions to Barriers:  finalize medical mgt, dc planning    Discharge Planning/Teaching Needs:  Home with wife who can provide 24/7 supervision      Team Discussion:  Excellent gains and reaching supervision to mod i goals  Revisions to Treatment Plan:  None   Continued Need for Acute Rehabilitation Level of Care: The patient requires daily medical management by a physician with specialized training in physical medicine and rehabilitation for the following conditions: Daily direction of a multidisciplinary physical rehabilitation program to ensure safe treatment while eliciting the highest  outcome that is of practical value to the patient.: Yes Daily medical management of patient stability for increased activity during participation in an intensive rehabilitation regime.: Yes Daily analysis of laboratory values and/or radiology reports with any subsequent need for medication adjustment of medical intervention for : Neurological problems;Other;Post surgical problems  Anthony Walls 04/02/2013, 10:48 AM

## 2013-04-03 ENCOUNTER — Inpatient Hospital Stay (HOSPITAL_COMMUNITY): Payer: Medicare Other

## 2013-04-03 ENCOUNTER — Inpatient Hospital Stay (HOSPITAL_COMMUNITY): Payer: Medicare Other | Admitting: Occupational Therapy

## 2013-04-03 LAB — BASIC METABOLIC PANEL
BUN: 21 mg/dL (ref 6–23)
CO2: 25 mEq/L (ref 19–32)
Calcium: 9 mg/dL (ref 8.4–10.5)
GFR calc non Af Amer: 48 mL/min — ABNORMAL LOW (ref >90)
Glucose, Bld: 110 mg/dL — ABNORMAL HIGH (ref 70–99)

## 2013-04-03 LAB — CBC
HCT: 23.2 % — ABNORMAL LOW (ref 39.0–52.0)
Hemoglobin: 7.6 g/dL — ABNORMAL LOW (ref 13.0–17.0)
MCH: 28.1 pg (ref 26.0–34.0)
MCHC: 32.8 g/dL (ref 30.0–36.0)
RBC: 2.7 MIL/uL — ABNORMAL LOW (ref 4.22–5.81)

## 2013-04-03 MED ORDER — ENSURE COMPLETE PO LIQD
120.0000 mL | Freq: Four times a day (QID) | ORAL | Status: DC
  Administered 2013-04-03 – 2013-04-04 (×3): 120 mL via ORAL

## 2013-04-03 NOTE — Progress Notes (Signed)
Occupational Therapy Discharge Summary  Patient Details  Name: Anthony Walls MRN: [id number] Date of Birth: [birth year]  Today's Date: 04/03/2013  Patient has met 10 of 10 long term goals due to improved activity tolerance, improved balance, postural control, ability to compensate for deficits, improved attention, improved awareness and improved coordination.  Pt has made steady progress with bathing, dressing, toilet transfers, tub transfers,toileting, simple snack prep, and laundry tasks during this admission.  Pt's wife has been present for the majority of therapy session.  Pt requires occasional verbal cues for safety awareness with RW and to adhere to back precautions. Patient to discharge at overall Supervision level.  Patient's care partner is independent to provide the necessary supervision assistance at discharge.    Recommendation: No additional occupational therapy recommended at this time.  Equipment: BSC  Reasons for discharge: treatment goals met and discharge from hospital  Patient/family agrees with progress made and goals achieved: Yes  Precautions/Restrictions  Precautions Precautions: Back;Fall Required Braces or Orthoses: Spinal Brace Spinal Brace: Lumbar corset;Applied in sitting position Restrictions Weight Bearing Restrictions: No  Pain Pain Assessment Pain Assessment: No/denies pain  ADL ADL Equipment Provided: Reacher;Sock aid;Long-handled sponge Eating: Independent Where Assessed-Eating: Chair Grooming: Independent Where Assessed-Grooming: Wheelchair Upper Body Bathing: Supervision/safety Where Assessed-Upper Body Bathing: Shower Lower Body Bathing: Supervision/safety Where Assessed-Lower Body Bathing: Shower Upper Body Dressing: Supervision/safety Where Assessed-Upper Body Dressing: Wheelchair Lower Body Dressing: Supervision/safety Where Assessed-Lower Body Dressing: Wheelchair Toileting: Supervision/safety Where Assessed-Toileting:  Teacher, adult education: Distant supervision Statistician Method: Proofreader: Raised toilet seat Tub/Shower Transfer: Close supervison Web designer Method: Ship broker: Information systems manager with back Film/video editor: Close supervision Film/video editor Method: Designer, industrial/product: Information systems manager with back  Vision/Perception  Vision - History Baseline Vision: Wears glasses only for reading Patient Visual Report: No change from baseline Vision - Assessment Eye Alignment: Within Functional Limits Vision Assessment: Vision not tested Perception Perception: Within Functional Limits Praxis Praxis: Intact   Cognition Overall Cognitive Status: Within Functional Limits for tasks assessed Arousal/Alertness: Awake/alert Orientation Level: Oriented X4  Sensation Sensation Light Touch: Appears Intact Stereognosis: Appears Intact Hot/Cold: Appears Intact Proprioception: Appears Intact Coordination Gross Motor Movements are Fluid and Coordinated: Yes Fine Motor Movements are Fluid and Coordinated: Yes  Motor  Motor Motor - Skilled Clinical Observations: Generalized LE weakness  Mobility  Bed Mobility Bed Mobility: Rolling Right;Rolling Left;Supine to Sit;Sit to Supine Rolling Right: 7: Independent Rolling Left: 7: Independent Supine to Sit: 7: Independent Sit to Supine: 7: Independent Transfers Sit to Stand: 5: Supervision Stand to Sit: 5: Supervision  Trunk/Postural Assessment  Cervical Assessment Cervical Assessment: Within Functional Limits Thoracic Assessment Thoracic Assessment: Exceptions to [facility] (pt wears LSO) Lumbar Assessment Lumbar Assessment: Exceptions to [facility] (pt wears LSO) Postural Control Postural Control: Within Functional Limits   Balance Static Sitting Balance Static Sitting - Balance Support: Feet supported;No upper extremity supported Static Sitting - Level of Assistance: 7:  Independent  Extremity/Trunk Assessment RUE Assessment RUE Assessment: Within Functional Limits LUE Assessment LUE Assessment: Within Functional Limits  See FIM for current functional status  Rich Brave 04/03/2013, 2:40 PM

## 2013-04-03 NOTE — Progress Notes (Signed)
Occupational Therapy Session Note  Patient Details  Name: Pacey Altizer MRN: [id number] Date of Birth: 12/21/1940  Today's Date: 04/03/2013 Time: 1000-1100 Time Calculation (min): 60 min  Short Term Goals: Week 1:  OT Short Term Goal 1 (Week 1): Short Term Goals=Long Term Goals  Skilled Therapeutic Interventions/Progress Updates:    Pt seated in w/c upon arrival.  Pt stated that he didn't "feel right" but wanted to take a shower.  Nurse Tech took temperature and measured BP (WNL) and patient amb with RW to bathroom.  Pt asked to sit on toilet for BM and while sitting there pt stated he felt nauseous.  RN notified.  Pt stated he felt better and was ready to take shower.  While seated in shower RN entered and gave patient Ginger Ale and crackers after which patient vomited numerous times.  RN present.  Pt stated he was ready.  He didn't feel nauseous anymore and didn't feel hot.  Pt completed bathing and dressing tasks with supervision using AE appropriately.  Wife was present and provided appropriate supervision.  Focus on continued family education, activity tolerance, safety awareness, and standing balance.  Therapy Documentation Precautions:  Precautions Precautions: Fall;Back Precaution Comments:    Required Braces or Orthoses: Spinal Brace Spinal Brace: Lumbar corset;Applied in sitting position Restrictions Weight Bearing Restrictions: No Pain: Pain Assessment Pain Assessment: No/denies pain  See FIM for current functional status  Therapy/Group: Individual Therapy  Rich Brave 04/03/2013, 11:05 AM

## 2013-04-03 NOTE — Progress Notes (Signed)
Physical Therapy Discharge Summary  Patient Details  Name: Anthony Walls MRN: [id number] Date of Birth: 1941-01-08  Today's Date: 04/03/2013 Time: 0915-1000 Time Calculation (min): 45 min  Patient has met 11 of 11 long term goals due to improved activity tolerance, improved balance, improved postural control, increased strength and functional use of  right lower extremity and left lower extremity.  Patient to discharge at an ambulatory level Supervision.   Patient's care partner is independent to provide the necessary physical assistance at discharge.  Reasons goals not met: n/a patient met all goals  Recommendation:  Patient will benefit from ongoing skilled PT services in outpatient setting to continue to advance safe functional mobility, address ongoing impairments in LE generalized weakness, dependency in functional mobility, ambulation, and stairs and minimize fall risk.  Equipment: rolling walker, wheelchair  Reasons for discharge: treatment goals met and discharge from hospital  Patient/family agrees with progress made and goals achieved: Yes  PT Discharge Precautions/Restrictions Precautions Precautions: Back;Fall Required Braces or Orthoses: Spinal Brace Spinal Brace: Lumbar corset;Applied in sitting position Restrictions Weight Bearing Restrictions: No Pain Pain Assessment Pain Assessment: No/denies pain Vision/Perception  Vision - History Baseline Vision: Wears glasses only for reading Patient Visual Report: No change from baseline Vision - Assessment Eye Alignment: Within Functional Limits Vision Assessment: Vision not tested Perception Perception: Within Functional Limits Praxis Praxis: Intact  Cognition Overall Cognitive Status: Within Functional Limits for tasks assessed Arousal/Alertness: Awake/alert Orientation Level: Oriented X4 Sensation Sensation Light Touch: Appears Intact Stereognosis: Appears Intact Hot/Cold: Appears Intact Proprioception:  Appears Intact Coordination Gross Motor Movements are Fluid and Coordinated: Yes Fine Motor Movements are Fluid and Coordinated: Yes Motor  Motor Motor - Skilled Clinical Observations: Generalized LE weakness  Mobility Bed Mobility Bed Mobility: Rolling Right;Rolling Left;Supine to Sit;Sit to Supine Rolling Right: 7: Independent Rolling Left: 7: Independent Supine to Sit: 7: Independent Sit to Supine: 7: Independent Transfers Sit to Stand: 5: Supervision Stand to Sit: 5: Supervision Stand Pivot Transfers: 5: Supervision Locomotion  Ambulation Ambulation: Yes Ambulation/Gait Assistance: 5: Supervision Ambulation Distance (Feet): 300 Feet Assistive device: Rolling walker Gait Gait: Yes Gait Pattern: Step-through pattern;Trunk flexed Stairs / Additional Locomotion Stairs: Yes Stairs Assistance: 5: Supervision Stair Management Technique: Two rails;Alternating pattern;Forwards Number of Stairs: 6 Height of Stairs: 6.5 Curb: 5: Programme researcher, broadcasting/film/video: Yes Wheelchair Assistance: 6: Modified independent (Device/Increase time) Occupational hygienist: Both upper extremities Distance: 150 feet  Trunk/Postural Assessment  Cervical Assessment Cervical Assessment: Within Functional Limits Thoracic Assessment Thoracic Assessment: Exceptions to [facility] (pt wears LSO) Lumbar Assessment Lumbar Assessment: Exceptions to [facility] (pt wears LSO) Postural Control Postural Control: Within Functional Limits  Balance Static Sitting Balance Static Sitting - Balance Support: Feet supported;No upper extremity supported Static Sitting - Level of Assistance: 7: Independent Extremity Assessment  RUE Assessment RUE Assessment: Within Functional Limits LUE Assessment LUE Assessment: Within Functional Limits     Treatment session focused on grad day activities and completing patient/family education. Patient ambulated 15 feet with RW and supervision. Patient performed  simulated car transfer with supervision/cueing to put bottom in first. Patient ambulated up and down 6 steps with bilateral rails and supervision. Patient performed bed mobility - supine to and from sit, rolling each direction - independently. Patient sit to stand from bed, sofa, simulated recliner, and straight back kitchen chair without arms with supervision. Patient ambulated up and down curb, on foam surface and stepped over objects (simulating threshold) with RW and supervision. Discussed safety and need to maintain activity at  home post discharge. Patient and wife report having no further questions. Patient left in wheelchair in room with items in reach.  See FIM for current functional status  Alma Friendly 04/03/2013, 11:44 AM

## 2013-04-03 NOTE — Progress Notes (Signed)
Patient working with OT in shower. Complained of feeling queasy after drinking orange juice and cranberry juice mixed together and taking miralax earlier. Patient had episode of emesis, orange in color with egg mixed in. Once done with vomiting, patient stated "feel better now" sipping on ginger ale, continued with OT session. [place], Margerie Fraiser Elon Jester

## 2013-04-03 NOTE — Progress Notes (Signed)
Occupational Therapy Session Note  Patient Details  Name: Anthony Walls MRN: [id number] Date of Birth: 08/12/1941  Today's Date: 04/03/2013 Time: 2841-3244 Time Calculation (min): 25 min  Short Term Goals: Week 1:  OT Short Term Goal 1 (Week 1): Short Term Goals=Long Term Goals  Skilled Therapeutic Interventions/Progress Updates:  No complaints of pain Patient found seated in w/c upon entering room. Patient Engaged in functional ambulation from room -> ADL apartment using rolling walker. Once in ADL apartment patient performed tub/shower transfer in/out tub (stepping over threshold) with supervision. Patient then engaged in bed mobility with supervision and furniture transfer with supervision. Patient able to recall and adhere to 3/3 back precautions independently. Therapist educated patient on w/c pushups for UB strengthening. Patient ambulated back to room at end of session and left seated in w/c with call bell & phone within reach, wife also present in room.   Precautions:  Precautions Precautions: Back;Fall Precaution Comments:    Required Braces or Orthoses: Spinal Brace Spinal Brace: Lumbar corset;Applied in sitting position Restrictions Weight Bearing Restrictions: No  ADL: ADL Equipment Provided: Reacher;Sock aid;Long-handled sponge Eating: Independent Where Assessed-Eating: Chair Grooming: Independent Where Assessed-Grooming: Wheelchair Upper Body Bathing: Supervision/safety Where Assessed-Upper Body Bathing: Shower Lower Body Bathing: Supervision/safety Where Assessed-Lower Body Bathing: Shower Upper Body Dressing: Supervision/safety Where Assessed-Upper Body Dressing: Wheelchair Lower Body Dressing: Supervision/safety Where Assessed-Lower Body Dressing: Wheelchair Toileting: Supervision/safety Where Assessed-Toileting: Teacher, adult education: Distant supervision Statistician Method: Proofreader: Raised toilet seat Tub/Shower  Transfer: Close supervison Web designer Method: Ship broker: Information systems manager with back Film/video editor: Close supervision Film/video editor Method: Designer, industrial/product: Shower seat with back  See FIM for current functional status  Therapy/Group: Individual Therapy  Mylen Mangan 04/03/2013, 2:48 PM

## 2013-04-03 NOTE — Plan of Care (Signed)
Problem: RH SKIN INTEGRITY Goal: RH STG ABLE TO PERFORM INCISION/WOUND CARE W/ASSISTANCE STG Able To Perform Incision/Wound Care With total Assistance of wife for back dressing Outcome: Progressing Wife has observed dressing change and is aware of need to clean daily with soap and water pat dry and cover with dry dressing

## 2013-04-03 NOTE — Progress Notes (Signed)
Physical Therapy Session Note  Patient Details  Name: Anthony Walls MRN: [id number] Date of Birth: 1941/06/30  Today's Date: 04/03/2013 Time: 4098-1191 Time Calculation (min): 57 min  Short Term Goals: Week 1:  PT Short Term Goal 1 (Week 1): Pt will ambulate x 25' with min-guard assist and LRAD PT Short Term Goal 2 (Week 1): Pt will transfer bed <> wheelchair with supervision PT Short Term Goal 3 (Week 1): Pt will tolerate 3 min continuous standing activity with no knee hyperextension or knee buckling.  Week 2:   = LTGs  Skilled Therapeutic Interventions/Progress Updates:    Initial part of session focused on return demonstration by wife for assisting pt with toileting (checked off on safety plan) and breakdown of w/c for putting into the car at d/c. Second part of session focused on community mobility with RW and w/c mobility navigating uneven surfaces, various transfers, energy conservation techniques, and overall endurance/strengthening. Pt still requiring intermittent cues for safety with transfers (locking brakes, hand placement on RW, and moving legrests out of the way). Pt overall S level with RW or w/c outdoors.   Therapy Documentation Precautions:  Precautions Precautions: Back;Fall Precaution Comments:    Required Braces or Orthoses: Spinal Brace Spinal Brace: Lumbar corset;Applied in sitting position Restrictions Weight Bearing Restrictions: No  Pain:  no complaints.   See FIM for current functional status  Therapy/Group: Individual Therapy  [name] [facility] 04/03/2013, 4:00 PM

## 2013-04-03 NOTE — Plan of Care (Signed)
Problem: SCI BLADDER ELIMINATION Goal: RH STG MANAGE BLADDER WITH ASSISTANCE STG Manage Bladder With min Assistance  Outcome: Progressing Voiding with urinal or to bathroom, PVR all less than 200 today on 7a -7p. No in and out cath required so far

## 2013-04-03 NOTE — Progress Notes (Signed)
Subjective/Complaints: Concerned about urine retention early this am. .  A 12 point review of systems has been performed and if not noted above is otherwise negative.   Objective: Vital Signs: Blood pressure 112/68, pulse 93, temperature 99.1 F (37.3 C), temperature source Oral, resp. rate 18, height 5\' 4"  (1.626 m), weight 92.67 kg (204 lb 4.8 oz), SpO2 100.00%. No results found.  Recent Labs  04/01/13 0610 04/03/13 0625  WBC 7.8 6.4  HGB 7.4* 7.6*  HCT 22.8* 23.2*  PLT 337 337    Recent Labs  04/03/13 0625  NA 135  K 4.5  CL 104  GLUCOSE 110*  BUN 21  CREATININE 1.42*  CALCIUM 9.0   CBG (last 3)  No results found for this basename: GLUCAP,  in the last 72 hours  Wt Readings from Last 3 Encounters:  04/02/13 92.67 kg (204 lb 4.8 oz)  03/20/13 95.1 kg (209 lb 10.5 oz)  03/20/13 95.1 kg (209 lb 10.5 oz)    Physical Exam:  HENT:  Head: Normocephalic and atraumatic.  Edentulous  Eyes: Pupils are equal, round, and reactive to light.  Neck: Normal range of motion.  Cardiovascular: Normal rate and regular rhythm. No murmurs  Pulmonary/Chest: Effort normal and breath sounds normal. No wheezes or rales  Abdominal: He exhibits minimal dstension. Bowel sounds are present There is no tenderness.  Musculoskeletal: He exhibits no edema and no tenderness.  Neurological: He is alert and oriented to person, place, and time.  LLE weakness with ataxia. Decreased sensation LLE greater than RLE to propioception. Only mild LT/PP sensation loss. LE strength 2 to 2+/5 prox to 3+ to 4/5 distally  Skin: Skin is warm and dry.  Back incision clean without drainage from lower end of wound. Well approx.   Assessment/Plan: 1. 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 Physiatrist is providing close team supervision and 24 hour management  of active medical problems listed below. Physiatrist and rehab team continue to assess barriers to discharge/monitor patient progress toward functional and medical goals. FIM: FIM - Bathing Bathing Steps Patient Completed: Chest;Right Arm;Abdomen;Left Arm;Front perineal area;Buttocks;Right upper leg;Left upper leg;Right lower leg (including foot);Left lower leg (including foot) Bathing: 5: Supervision: Safety issues/verbal cues  FIM - Upper Body Dressing/Undressing Upper body dressing/undressing steps patient completed: Thread/unthread right sleeve of pullover shirt/dresss;Thread/unthread left sleeve of pullover shirt/dress;Put head through opening of pull over shirt/dress;Pull shirt over trunk Upper body dressing/undressing: 5: Set-up assist to: Obtain clothing/put away FIM - Lower Body Dressing/Undressing Lower body dressing/undressing steps patient completed: Thread/unthread right pants leg;Thread/unthread left pants leg;Don/Doff left sock;Don/Doff right sock;Pull pants up/down;Fasten/unfasten pants Lower body dressing/undressing: 5: Supervision: Safety issues/verbal cues  FIM - Toileting Toileting steps completed by patient: Adjust clothing prior to toileting;Adjust clothing after toileting Toileting Assistive Devices: Grab bar or rail for support Toileting: 3: Mod-Patient completed 2 of 3 steps  FIM - Diplomatic Services operational officer Devices: Grab bars;Walker Toilet Transfers: 5-To toilet/BSC: Supervision (verbal cues/safety issues);5-From toilet/BSC: Supervision (verbal cues/safety issues)  FIM - Banker Devices: Walker;Arm rests Bed/Chair Transfer: 6: Supine > Sit: No assist;6: Sit > Supine: No assist;5: Bed > Chair or W/C: Supervision (verbal cues/safety issues);5: Chair or W/C > Bed: Supervision (verbal cues/safety issues)  FIM - Locomotion: Wheelchair Distance: 150 Locomotion: Wheelchair: 0: Activity did not occur FIM -  Locomotion: Ambulation Locomotion: Ambulation Assistive Devices: Designer, industrial/product  Ambulation/Gait Assistance: 5: Supervision;4: Min guard Locomotion: Ambulation: 4: Travels 150 ft or more with minimal assistance (Pt.>75%)  Comprehension Comprehension Mode: Auditory Comprehension: 6-Follows complex conversation/direction: With extra time/assistive device  Expression Expression Mode: Verbal Expression: 7-Expresses complex ideas: With no assist  Social Interaction Social Interaction: 7-Interacts appropriately with others - No medications needed.  Problem Solving Problem Solving: 7-Solves complex problems: Recognizes & self-corrects  Memory Memory: 7-Complete Independence: No helper  Medical Problem List and Plan:  1. DVT Prophylaxis/Anticoagulation: Mechanical: Antiembolism stockings, knee (TED hose) Bilateral lower extremities  Sequential compression devices, below knee Bilateral lower extremities  2. OA/ Pain Management: prn medications effective.  3. Mood: Motivated to get better.  4. Neuropsych: This patient is capable of making decisions on his/her own behalf.  5. HTN: Monitor with bid checks. Continue cozaar. Encourage po fluid intake.  6. ABLA: added iron supplement. hgb holding around 7.6. Recheck as an outpt 7.Constipation: results with SSE. Daily regimen with miralax. Added fibercon 8. 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 Continue flomax.    -added urecholine which is helping--increased to qid--CONTINUE AT DC   -needs to be at EOB when voiding, double voiding- I RE-EMPHASIZED THIS AGAIN TODAY WITH PT/WIFE  -I DO NOT PLAN ON HIM CATHING AT HOME 9. Mild renal insufficiency- push fluids. BUN/CR improved  -improved-recheck tomorrow LOS (Days) 9 A FACE TO FACE EVALUATION WAS PERFORMED  Anthony Walls T 04/03/2013 9:12 AM

## 2013-04-04 MED ORDER — POLYSACCHARIDE IRON COMPLEX 150 MG PO CAPS: 150.0000 mg | ORAL_CAPSULE | Freq: Two times a day (BID) | ORAL | Status: DC

## 2013-04-04 MED ORDER — BETHANECHOL CHLORIDE 25 MG PO TABS: ORAL_TABLET | ORAL | Status: DC

## 2013-04-04 MED ORDER — TAMSULOSIN HCL 0.4 MG PO CAPS: 0.4000 mg | ORAL_CAPSULE | Freq: Two times a day (BID) | ORAL | Status: DC

## 2013-04-04 MED ORDER — POLYETHYLENE GLYCOL 3350 17 G PO PACK: 17.0000 g | PACK | Freq: Two times a day (BID) | ORAL | Status: DC

## 2013-04-04 MED ORDER — ACETAMINOPHEN 325 MG PO TABS: 325.0000 mg | ORAL_TABLET | ORAL | Status: DC | PRN

## 2013-04-04 NOTE — Plan of Care (Signed)
Problem: RH SKIN INTEGRITY Goal: RH STG ABLE TO PERFORM INCISION/WOUND CARE W/ASSISTANCE STG Able To Perform Incision/Wound Care With total Assistance of wife for back dressing  Outcome: Completed/Met Date Met:  04/04/13 Wife demonstrate on putting the dressing.

## 2013-04-04 NOTE — Progress Notes (Signed)
Social Work  Discharge Note  The overall goal for the admission was met for:   Discharge location: Yes - home with wife to assist as needed  Length of Stay: Yes - 10 days  Discharge activity level: Yes - supervision to minimal assist  Home/community participation: Yes  Services provided included: MD, RD, PT, OT, RN, TR, Pharmacy and SW  Financial Services: Medicare  Follow-up services arranged: Outpatient: PT via [facility], DME: [employer] [place] w/c, basic, walker, 3n1 commode via [facility] and Patient/Family has no preference for HH/DME agencies  Comments (or additional information):  Patient/Family verbalized understanding of follow-up arrangements: Yes  Individual responsible for coordination of the follow-up plan: patient  Confirmed correct DME delivered: Toya Palacios 04/04/2013    Reeva Davern

## 2013-04-04 NOTE — Discharge Summary (Signed)
Physician Discharge Summary  Patient ID: Anthony Walls MRN: [id number] DOB/AGE: [birth year] 72 y.o.  Admit date: 03/25/2013 Discharge date: 04/04/2013  Discharge Diagnoses:  Active Problems:   Thoracic spine tumor   Compression of spinal cord with myelopathy   Acute blood loss anemia   HTN (hypertension)   Renal insufficiency   Discharged Condition: Good.   Significant Diagnostic Studies: N/A  Labs:  Basic Metabolic Panel:  Recent Labs Lab 03/31/13 0500 04/03/13 0625  NA 134* 135  K 4.4 4.5  CL 102 104  CO2 26 25  GLUCOSE 120* 110*  BUN 33* 21  CREATININE 1.51* 1.42*  CALCIUM 8.8 9.0    CBC:  Recent Labs Lab 03/31/13 0500 04/01/13 0610 04/03/13 0625  WBC 6.9 7.8 6.4  HGB 7.5* 7.4* 7.6*  HCT 22.3* 22.8* 23.2*  MCV 85.8 87.4 85.9  PLT 336 337 337    CBG: No results found for this basename: GLUCAP,  in the last 168 hours  Brief HPI:   Anthony Walls is a 72 y.o. male with history of HTN, back pain who developed problems with mobility 7-10 days prior to admission. 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 Patient admitted on 03/20/13 for T9-T10 decompression with tumor removal and T9-T11 fusion by Dr. Gerlene Fee. Heme Onc consulted for input? ABLA treated with transfusion. Therapies initiated and patient continues to be limited by LLE instability and weakness. PT, OT recommending CIR    Hospital Course: Anthony Walls was admitted to rehab 03/25/2013 for inpatient therapies to consist of PT, ST and OT at least three hours five days a week. Past admission physiatrist, therapy team and rehab RN have worked together to provide customized collaborative inpatient rehab. Wound has been monitored daily and has been healing well. Serosanguinous drainage has resolved.  Follow up labs were done and reveal H/H to be stable. Mild hyponatremia has resolved. Creatinine stable.  Patient was severely constipated and  aggressive bowel program was initiated with good results. Urinary retention was treated with in and out cath's. He was started on urecholine as Flomax was increased to bid. Urine culture showed no growth. Retention has resolved and no cath's required in the last 24 hours. He has made good progress during his stay and is at supervision level overall He has had good improvement in LLE strength with decrease in ataxia.    Rehab course: During patient's stay in rehab weekly team conferences were held to monitor patient's progress, set goals and discuss barriers to discharge.  Patient has made steady progress with bathing, dressing, toilet transfers, tub transfers,toileting, simple snack prep, and laundry tasks during this admission. He does require occasional verbal cues for safety awareness with RW and to adhere to back precautions. He is at supervision level for ADL tasks. Physical therapy has worked with patient on balance, endurance, and strengthening. He requires supervision and RW for all mobility. His wife has been present for majority of therapy sessions and been educated on supervision/safety. He will continue with outpatient therapies past discharge.   Disposition: 01-Home or Self Care   Diet: Regular  Special Instructions: 1. Wear back brace whenever you are out of bed or sitting at edge of bed. No bending, twisting, arching or lifting anything over 5 Lbs.  2. Discuss pathology report/referral to Hem/Onc with Dr. Gerlene Fee.  3. Stand to urinate--press on bladder to make sure you have emptied.  Follow up with urology if problems recur.  Future Appointments Provider Department Dept Phone   04/30/2013 9:20 AM Ranelle Oyster, MD [facility] [phone]       Medication List    STOP taking these medications       cholestyramine 4 G packet  Commonly known as:  QUESTRAN     cyclobenzaprine 10 MG tablet  Commonly known as:  FLEXERIL      etodolac 300 MG capsule  Commonly known as:  LODINE     HYDROcodone-acetaminophen 5-325 MG per tablet  Commonly known as:  NORCO/VICODIN      TAKE these medications       acetaminophen 325 MG tablet  Commonly known as:  TYLENOL  Take 1-2 tablets (325-650 mg total) by mouth every 4 (four) hours as needed.     bethanechol 25 MG tablet  Commonly known as:  URECHOLINE  Taper as below:  Take one pill four time a day--05/2-05/09/14  Decrease to one pill three times a day from -05/10-05/16/14  Decrease to one pill twice a day from--05/17-05/23  Then take one pill daily till gone.     iron polysaccharides 150 MG capsule  Commonly known as:  NIFEREX  Take 1 capsule (150 mg total) by mouth 2 (two) times daily before lunch and supper. For anemia     losartan 50 MG tablet  Commonly known as:  COZAAR  Take 50 mg by mouth daily.     polyethylene glycol packet  Commonly known as:  MIRALAX / GLYCOLAX  Take 17 g by mouth 2 (two) times daily. For constipation.     tamsulosin 0.4 MG Caps  Commonly known as:  FLOMAX  Take 1 capsule (0.4 mg total) by mouth 2 (two) times daily. Dose increased to help bladder function.       Follow-up Information   Follow up with Ranelle Oyster, MD On 04/30/2013. (BE there at 9 am for 9:20  appointment)    Contact information:   [address], [address] [place] [address] [phone]       Follow up with Reinaldo Meeker, MD. Call today. (for follow up appointment)    Contact information:   [address] [place] [address] [phone]       Follow up with Toniann Fail, MD On 04/21/2013. (@ 11:15 am)    Contact information:   [address] [place] [address] [phone]       Signed: Jacquelynn Cree 04/04/2013, 1:20 PM

## 2013-04-04 NOTE — Progress Notes (Signed)
Patient discharge to home with wife and daughter at 85.  Discharge instruction provided by Delle Reining, PA.  Patient and family verbalize understanding, no further questions ask.  Patient escort off unit by NT.

## 2013-04-04 NOTE — Progress Notes (Signed)
Subjective/Complaints: Voiding better. Using double voiding/massage technique.   A 12 point review of systems has been performed and if not noted above is otherwise negative.   Objective: Vital Signs: Blood pressure 121/62, pulse 77, temperature 99.3 F (37.4 C), temperature source Oral, resp. rate 18, height 5\' 4"  (1.626 m), weight 92.67 kg (204 lb 4.8 oz), SpO2 98.00%. No results found.  Recent Labs  04/03/13 0625  WBC 6.4  HGB 7.6*  HCT 23.2*  PLT 337    Recent Labs  04/03/13 0625  NA 135  K 4.5  CL 104  GLUCOSE 110*  BUN 21  CREATININE 1.42*  CALCIUM 9.0   CBG (last 3)  No results found for this basename: GLUCAP,  in the last 72 hours  Wt Readings from Last 3 Encounters:  04/02/13 92.67 kg (204 lb 4.8 oz)  03/20/13 95.1 kg (209 lb 10.5 oz)  03/20/13 95.1 kg (209 lb 10.5 oz)    Physical Exam:  HENT:  Head: Normocephalic and atraumatic.  Edentulous  Eyes: Pupils are equal, round, and reactive to light.  Neck: Normal range of motion.  Cardiovascular: Normal rate and regular rhythm. No murmurs  Pulmonary/Chest: Effort normal and breath sounds normal. No wheezes or rales  Abdominal: He exhibits minimal dstension. Bowel sounds are present There is no tenderness.  Musculoskeletal: He exhibits no edema and no tenderness.  Neurological: He is alert and oriented to person, place, and time.  LLE weakness with ataxia. Decreased sensation LLE greater than RLE to propioception. Only mild LT/PP sensation loss. LE strength  2+ to 3+/5 prox at HF and KE to   4/5 distally at ankles Skin: Skin is warm and dry.  Back incision clean without drainage from lower end of wound. Well approx.   Assessment/Plan: 1. 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 Physiatrist is providing close team supervision and 24 hour management of  active medical problems listed below. Physiatrist and rehab team continue to assess barriers to discharge/monitor patient progress toward functional and medical goals. FIM: FIM - Bathing Bathing Steps Patient Completed: Chest;Right Arm;Abdomen;Left Arm;Front perineal area;Buttocks;Right upper leg;Left upper leg;Right lower leg (including foot);Left lower leg (including foot) Bathing: 5: Supervision: Safety issues/verbal cues  FIM - Upper Body Dressing/Undressing Upper body dressing/undressing steps patient completed: Thread/unthread right sleeve of pullover shirt/dresss;Thread/unthread left sleeve of pullover shirt/dress;Put head through opening of pull over shirt/dress;Pull shirt over trunk Upper body dressing/undressing: 5: Set-up assist to: Obtain clothing/put away FIM - Lower Body Dressing/Undressing Lower body dressing/undressing steps patient completed: Thread/unthread right pants leg;Thread/unthread left pants leg;Don/Doff left sock;Don/Doff right sock;Pull pants up/down;Fasten/unfasten pants;Don/Doff right shoe;Don/Doff left shoe Lower body dressing/undressing: 5: Supervision: Safety issues/verbal cues  FIM - Toileting Toileting steps completed by patient: Adjust clothing prior to toileting;Performs perineal hygiene;Adjust clothing after toileting Toileting Assistive Devices: Grab bar or rail for support Toileting: 5: Supervision: Safety issues/verbal cues  FIM - Diplomatic Services operational officer Devices: Art gallery manager Transfers: 5-To toilet/BSC: Supervision (verbal cues/safety issues);5-From toilet/BSC: Supervision (verbal cues/safety issues)  FIM - Press photographer Assistive Devices: Arm rests;Walker Bed/Chair Transfer: 6: Supine > Sit: No assist;6: Sit > Supine: No assist;5: Bed > Chair or W/C: Supervision (verbal cues/safety issues);5: Chair or W/C > Bed: Supervision (verbal cues/safety issues)  FIM - Locomotion: Wheelchair Distance: 150  feet Locomotion: Wheelchair: 0: Activity did not occur FIM - Locomotion: Ambulation Locomotion: Ambulation Assistive Devices:  Walker - Rolling Ambulation/Gait Assistance: 5: Supervision Locomotion: Ambulation: 5: Travels 150 ft or more with supervision/safety issues  Comprehension Comprehension Mode: Auditory Comprehension: 5-Understands complex 90% of the time/Cues < 10% of the time  Expression Expression Mode: Verbal Expression: 5-Expresses complex 90% of the time/cues < 10% of the time  Social Interaction Social Interaction: 7-Interacts appropriately with others - No medications needed.  Problem Solving Problem Solving: 5-Solves complex 90% of the time/cues < 10% of the time  Memory Memory: 7-Complete Independence: No helper  Medical Problem List and Plan:  1. DVT Prophylaxis/Anticoagulation: Mechanical: Antiembolism stockings, knee (TED hose) Bilateral lower extremities  Sequential compression devices, below knee Bilateral lower extremities  2. OA/ Pain Management: prn medications effective.  3. Mood: Motivated to get better.  4. Neuropsych: This patient is capable of making decisions on his/her own behalf.  5. HTN: Monitor with bid checks. Continue cozaar. Encourage po fluid intake.  6. ABLA: added iron supplement. hgb holding around 7.6. Recheck as an outpt 7.Constipation: results with SSE. Daily regimen with miralax. Added fibercon 8. 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 Continue flomax.    -added urecholine which is helping--increased to qid--CONTINUE AT DC---slow taper!   -needs to be at EOB when voiding, double voiding- I RE-EMPHASIZED THIS AGAIN TODAY WITH PT/WIFE  -I DO NOT PLAN ON HIM CATHING AT HOME 9. Mild renal insufficiency- push fluids.    -improved on follow up labwork LOS (Days) 10 A FACE TO FACE EVALUATION WAS PERFORMED  Chalsey Leeth T 04/04/2013 8:30 AM

## 2013-04-30 ENCOUNTER — Encounter: Payer: Medicare Other | Admitting: Physical Medicine & Rehabilitation

## 2013-05-16 ENCOUNTER — Encounter: Payer: Medicare Other | Admitting: Physical Medicine & Rehabilitation

## 2013-05-23 ENCOUNTER — Encounter: Payer: Medicare Other | Attending: Physical Medicine & Rehabilitation | Admitting: Physical Medicine & Rehabilitation

## 2013-05-23 ENCOUNTER — Encounter: Payer: Self-pay | Admitting: Physical Medicine & Rehabilitation

## 2013-05-23 VITALS — BP 119/59 | HR 90 | Resp 16 | Ht 66.0 in | Wt 203.0 lb

## 2013-05-23 DIAGNOSIS — M79609 Pain in unspecified limb: Secondary | ICD-10-CM | POA: Insufficient documentation

## 2013-05-23 DIAGNOSIS — G9520 Unspecified cord compression: Secondary | ICD-10-CM

## 2013-05-23 DIAGNOSIS — D492 Neoplasm of unspecified behavior of bone, soft tissue, and skin: Secondary | ICD-10-CM

## 2013-05-23 DIAGNOSIS — G959 Disease of spinal cord, unspecified: Secondary | ICD-10-CM

## 2013-05-23 DIAGNOSIS — R29898 Other symptoms and signs involving the musculoskeletal system: Secondary | ICD-10-CM | POA: Insufficient documentation

## 2013-05-23 DIAGNOSIS — N319 Neuromuscular dysfunction of bladder, unspecified: Secondary | ICD-10-CM | POA: Insufficient documentation

## 2013-05-23 DIAGNOSIS — K592 Neurogenic bowel, not elsewhere classified: Secondary | ICD-10-CM | POA: Insufficient documentation

## 2013-05-23 DIAGNOSIS — G992 Myelopathy in diseases classified elsewhere: Secondary | ICD-10-CM | POA: Insufficient documentation

## 2013-05-23 DIAGNOSIS — C9030 Solitary plasmacytoma not having achieved remission: Secondary | ICD-10-CM | POA: Insufficient documentation

## 2013-05-23 DIAGNOSIS — R209 Unspecified disturbances of skin sensation: Secondary | ICD-10-CM | POA: Insufficient documentation

## 2013-05-23 NOTE — Patient Instructions (Signed)
WORK ON REGULAR EXERCISE AT HOME. TRY WALKING IN THE POOL FOR EXERCISE AS WELL.

## 2013-05-23 NOTE — Progress Notes (Signed)
Subjective:    Patient ID: Anthony Walls, male    DOB: Jan 12, 1941, 72 y.o.   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 He has completed his radiation. He only had one therapy session apparently due to his XRT starting. He also has a consultation in [facility] next week to determine further rx of his plasmacytoma.  He continues to have some weakness in the right leg with associated numbness as well.   His bladder is doing well. His habits are normal. He has tapered off the urecholine with no problems.   From a pain standpoint, he is still having some pain in the right leg and often around the operative site. Tylenol usually relieves the pain. He may take one tylenol every other day.   Pain Inventory Average Pain 5 Pain Right Now 5 My pain is intermittent and aching  In the last 24 hours, has pain interfered with the following? General activity na Relation with others na Enjoyment of life na What TIME of day is your pain at its worst? na Sleep (in general) Good  Pain is worse with: unsure Pain improves with: medication Relief from Meds: 7  Mobility use a walker ability to climb steps?  yes do you drive?  yes use a wheelchair  Function disabled: date disabled na  Neuro/Psych trouble walking  Prior Studies Any changes since last visit?  no  Physicians involved in your care Any changes since last visit?  no   Family History  Problem Relation Age of Onset  . Diabetes Mother    History   Social History  . Marital Status: Married    Spouse Name: N/A    Number of Children: N/A  . Years of Education: N/A   Social History Main Topics  . Smoking status: Never Smoker   . Smokeless tobacco: None  . Alcohol Use: No  . Drug Use: No  . Sexually Active: None   Other Topics Concern  . None   Social History Narrative  . None   Past Surgical History  Procedure Laterality Date  . Brain surgery  98    tumor  . Laminectomy N/A  03/20/2013    Procedure: THORACIC LAMINECTOMY FOR TUMOR;  Surgeon: Reinaldo Meeker, MD;  Location: [facility];  Service: Neurosurgery;  Laterality: N/A;  Thoracic Laminectomy for Thoracic Ten Tumor, Pedicle Screws at Thoracic Nine through Eleven   Past Medical History  Diagnosis Date  . Hypertension   . Arthritis   . Hesitancy     ?BPH   BP 119/59  Pulse 90  Resp 16  Ht 5\' 6"  (1.676 m)  Wt 203 lb (92.08 kg)  BMI 32.78 kg/m2  SpO2 98%     Review of Systems  Musculoskeletal: Positive for gait problem.  All other systems reviewed and are negative.       Objective:   Physical Exam   General: Alert and oriented x 3, No apparent distress HEENT: Head is normocephalic, atraumatic, PERRLA, EOMI, sclera anicteric, oral mucosa pink and moist, dentition intact, ext ear canals clear,  Neck: Supple without JVD or lymphadenopathy Heart: Reg rate and rhythm. No murmurs rubs or gallops Chest: CTA bilaterally without wheezes, rales, or rhonchi; no distress Abdomen: Soft, non-tender, non-distended, bowel sounds positive. Extremities: No clubbing, cyanosis, or edema. Pulses are 2+ Skin: Clean and intact without signs of breakdown Neuro: Pt is cognitively appropriate with normal insight, memory, and awareness. Cranial nerves 2-12 are intact. Sensory  exam is diminished in the right leg up to near the waist line.   Reflexes are 1+ to 2+ in all 4's. Fine motor coordination is intact. No tremors. Motor function is grossly 5/5. Gait is slightly wide based, and he occasionally has problems advancing the right foot, but it showed no signs of instability. He had no pain in the right foot.  Musculoskeletal: Full ROM, No pain with AROM or PROM in the neck, trunk, or extremities. Posture appropriate Psych: Pt's affect is appropriate. Pt is cooperative        Assessment & Plan:  Assessment: 1. 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 Neurogenic  bowel and bladder   Plan: 1. Continue with HEP. He may work on basic walking and even pool walking as well. He is doing so well that I am not sure he needs formal PT at this point.  2. Continue with flomax for bladder. He will remain off the urecholine 3. Tylenol is working well for pain. There is no reason at this point that he should take anything else. Reviewed appropriate stretching, technique as well in regard to the right leg.  4. If he should need me, he is welcome to call for an appt. I did not schedule follow up. 15 minutes of face to face patient care time were spent during this visit. All questions were encouraged and answered.

## 2013-11-24 IMAGING — CR DG CHEST 2V
2 series · 2 of 2 positions shown · non-contrast
Comparison: None.

CLINICAL DATA: Preadmission for laminectomy

CHEST - 2 VIEW

[view not recorded (1 of 2)]
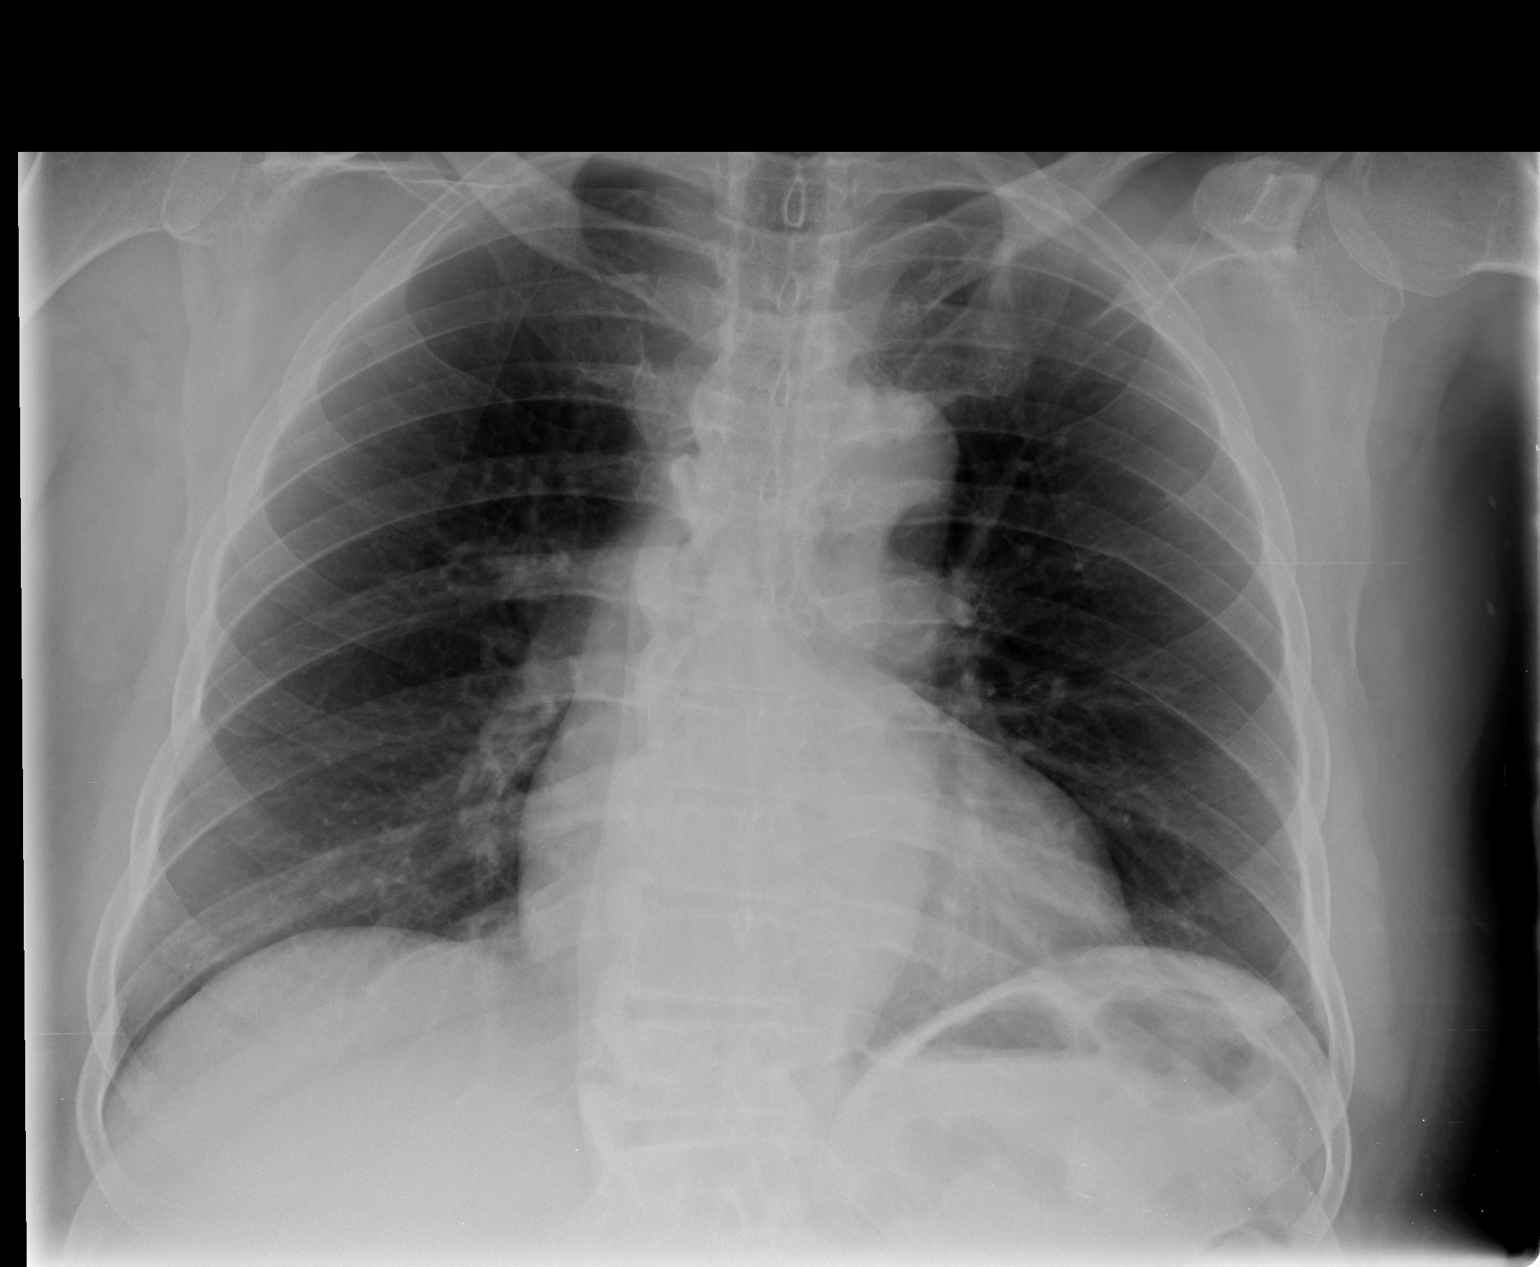

[view not recorded (2 of 2)]
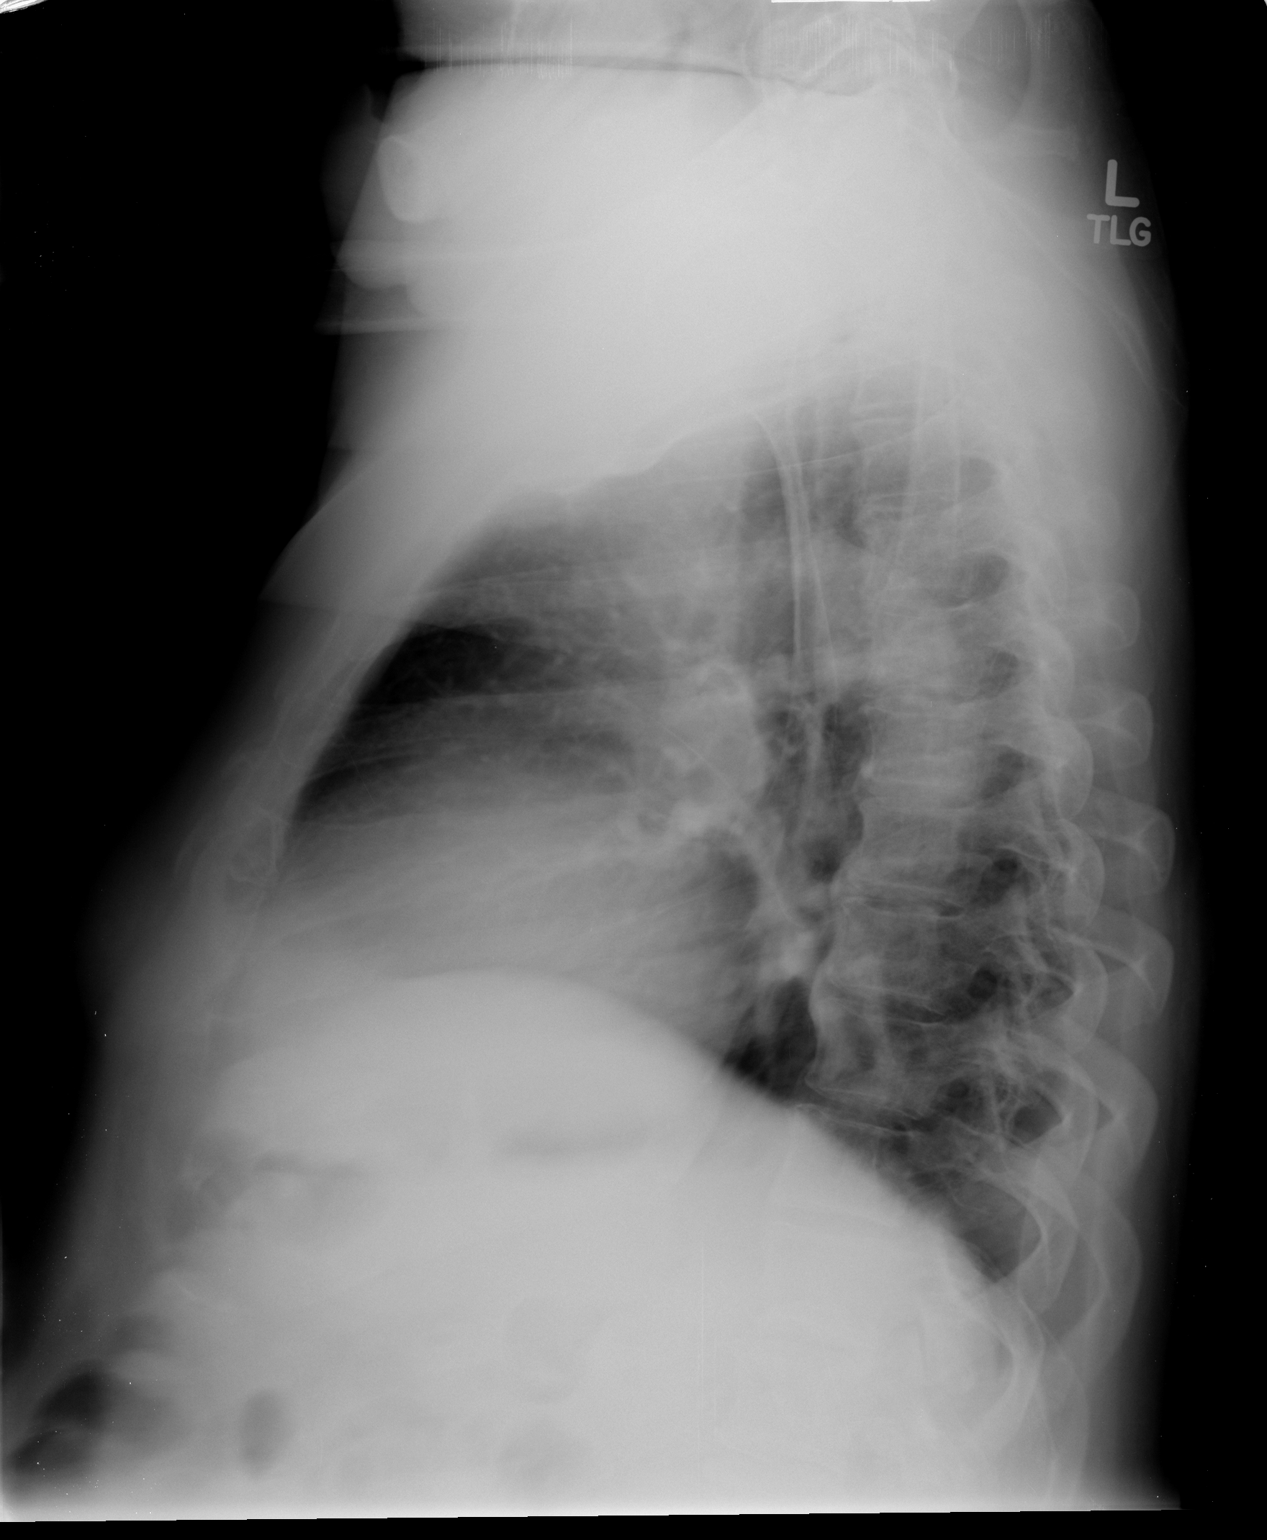

[2 of 2 positions shown; findings below may reference images not displayed]

FINDINGS: Cardiomediastinal silhouette is unremarkable.  Mild
degenerative changes thoracic spine.  No acute infiltrate or
pleural effusion.  No pulmonary edema.  Probable old fracture of
the left fifth rib.
IMPRESSION: No active disease.  Degenerative changes thoracic spine.

## 2013-11-25 IMAGING — RF DG C-ARM 61-120 MIN
1 series · 2 of 2 positions shown · non-contrast
Comparison: Preoperative chest radiograph 03/19/2013.

Fluoroscopy time of 1.0 minutes was utilized.

CLINICAL DATA: 71-year-old male undergoing resection of T10 mass,
T9-T11 pedicle screws.

THORACIC SPINE - 2 VIEW

[Series 1: run · 2 of 2 slices shown]
[im 1/2]
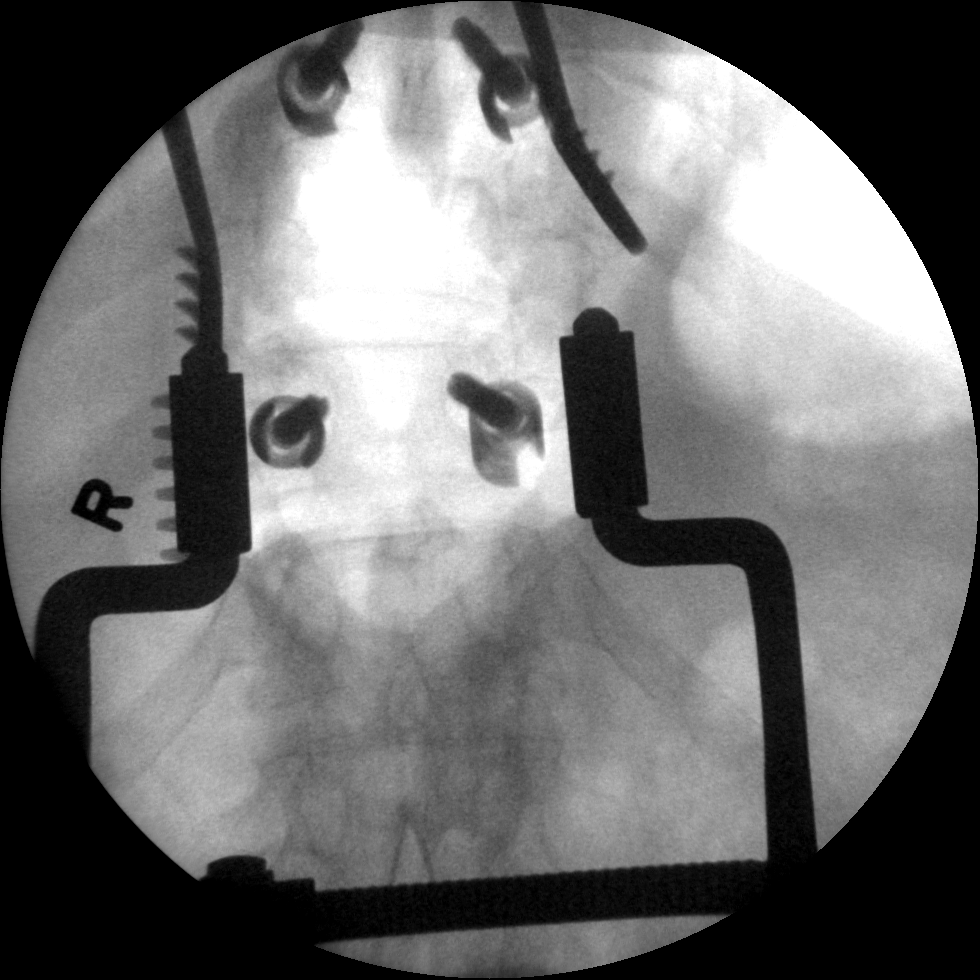
[im 2/2]
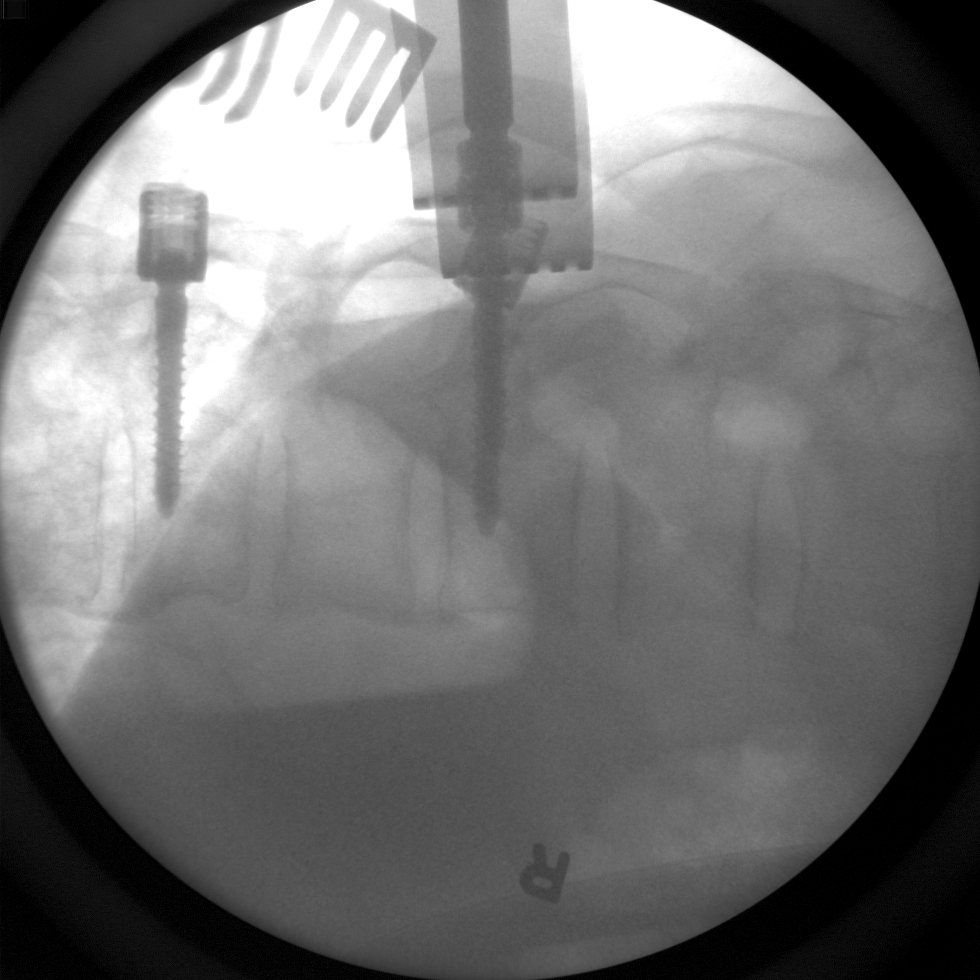

[2 of 2 positions shown; findings below may reference images not displayed]

FINDINGS: 12 full size ribs visible on the comparison.
Intraoperative frontal and lateral fluoroscopic views coned-down on
the lower thoracic spine do appear consistent with T9 and T11
transpedicular hardware placement surrounding the T10 vertebral
body.
IMPRESSION: T9 through T11 level surgery as detailed above peri

## 2017-01-13 ENCOUNTER — Encounter (HOSPITAL_BASED_OUTPATIENT_CLINIC_OR_DEPARTMENT_OTHER): Payer: Self-pay | Admitting: Emergency Medicine

## 2017-01-13 ENCOUNTER — Emergency Department (HOSPITAL_BASED_OUTPATIENT_CLINIC_OR_DEPARTMENT_OTHER)
Admission: EM | Admit: 2017-01-13 | Discharge: 2017-01-13 | Disposition: A | Discharge status: Home / Self Care | Payer: Medicare Other | Attending: Emergency Medicine | Admitting: Emergency Medicine

## 2017-01-13 DIAGNOSIS — I10 Essential (primary) hypertension: Secondary | ICD-10-CM | POA: Diagnosis not present

## 2017-01-13 DIAGNOSIS — R531 Weakness: Secondary | ICD-10-CM | POA: Diagnosis not present

## 2017-01-13 DIAGNOSIS — Z79899 Other long term (current) drug therapy: Secondary | ICD-10-CM | POA: Diagnosis not present

## 2017-01-13 DIAGNOSIS — R200 Anesthesia of skin: Secondary | ICD-10-CM | POA: Diagnosis present

## 2017-01-13 DIAGNOSIS — R29898 Other symptoms and signs involving the musculoskeletal system: Secondary | ICD-10-CM

## 2017-01-13 HISTORY — DX: Multiple myeloma not having achieved remission: C90.00

## 2017-01-13 MED ORDER — CARBAMIDE PEROXIDE 6.5 % OT SOLN: 5.0000 [drp] | Freq: Two times a day (BID) | OTIC | 0 refills | Status: DC

## 2017-01-13 NOTE — ED Triage Notes (Addendum)
Numbness to left arm and left leg for years but  Worse today and pain with movement to left arm. Denies recent injury . Also would like to have ears checked for wax

## 2017-01-13 NOTE — ED Notes (Signed)
Pt and family given d/c instructions as per chart. Rx x 1. Verbalizes understanding. No questions. 

## 2017-01-13 NOTE — Discharge Instructions (Addendum)
It is nice taking care of you! I don't think you have a stroke or other serious acute medical problem.  For your leg weakness and shoulder problem, I recommend follow-up with your primary care doctor. They may order referral to physical therapy for more assessment.   For your slow heart rate, will recommend you follow up with her doctors (cardiologist).   For the ear wax, please take this prescription to the pharmacy. It is also available over-the-counter.  Please seek immediate care if you have slurred speech, facial drooping, vision change, numbness or tingling in your arms or legs or other symptoms concerning to you.

## 2017-01-13 NOTE — ED Notes (Signed)
ED Provider at bedside. 

## 2017-01-13 NOTE — ED Provider Notes (Signed)
[facility] DEPT [facility] Provider Note   CSN: [id number] Arrival date & time: 01/13/17  1054     History   Chief Complaint Chief Complaint  Patient presents with  . Numbness    HPI Anthony Walls is a 76 y.o. male. Patient is here with his wife and daughter who contributed to history.   HPI Anthony Walls is a 76 year old male with history of multiple myeloma (on treatment), myelopathy, thoracic spin tumor who presents with episode of right leg weakness that happened this morning.  Patient was getting up from his bed to go to bathroom when he felt his right leg was weak. He reports similar symptom a week ago. He also states having right leg pain since he had brain surgery for benign tumor in 2000.  Leg pain has resolved now.   Patient denies numbness although he reported this to triage nurse. He denies other neurologic symptom such as vision change, slurred speech, difficulty speaking, numbness or weakness in his arms or left leg. Denies headache, runny nose, sore throat, chest pain, shortness of breath, nausea, vomiting, diarrhea, abdominal pain or dysuria. He has history of neurogenic bladder and uses depends at baseline.   Off note, he reports an incident of fall about a week ago after he slipped on his way to mail box. He denies hitting his head. Someone helped him up and he was able to walk back to his house. He denies pain or skin bruise after the incident.  Daughter states that patient is a little bit stubborn to use walker but cane.   Past Medical History:  Diagnosis Date  . Arthritis   . Hesitancy    ?BPH  . Hypertension   . Multiple myeloma [facility]) 2014    Patient Active Problem List   Diagnosis Date Noted  . 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 Acute blood loss anemia 03/26/2013  . Renal insufficiency 03/26/2013    Past Surgical History:  Procedure Laterality Date  . BRAIN SURGERY  98   tumor  .  LAMINECTOMY N/A 03/20/2013   Procedure: THORACIC LAMINECTOMY FOR TUMOR;  Surgeon: [name], MD;  Location: [place] NEURO ORS;  Service: Neurosurgery;  Laterality: N/A;  Thoracic Laminectomy for Thoracic Ten Tumor, Pedicle Screws at Thoracic Nine through Eleven  . TRANSURETHRAL RESECTION OF PROSTATE         Home Medications    Prior to Admission medications   Medication Sig Start Date End Date Taking? Authorizing Provider  acetaminophen (TYLENOL) 325 MG tablet Take 1-2 tablets (325-650 mg total) by mouth every 4 (four) hours as needed. 04/04/13   Bary Leriche, PA-C  bethanechol (URECHOLINE) 25 MG tablet Taper as below: Take one pill four time a day--05/2-05/09/14 Decrease to one pill three times a day from -05/10-05/16/14 Decrease to one pill twice a day from--05/17-05/23 Then take one pill daily till gone. 04/04/13   Bary Leriche, PA-C  carbamide peroxide (DEBROX) 6.5 % otic solution Place 5 drops into the right ear 2 (two) times daily. 01/13/17   Mercy Riding, MD  dexamethasone (DECADRON) 4 MG tablet  03/18/13   Historical Provider, MD  etodolac (LODINE) 300 MG capsule  03/10/13   Historical Provider, MD  fluconazole (DIFLUCAN) 150 MG tablet  04/14/13   Historical Provider, MD  fluticasone (CUTIVATE) 0.05 % cream  04/15/13   Historical Provider, MD  HYDROcodone-acetaminophen (NORCO/VICODIN) 5-325 MG per tablet  03/13/13   Historical Provider,  MD  iron polysaccharides (NIFEREX) 150 MG capsule Take 1 capsule (150 mg total) by mouth 2 (two) times daily before lunch and supper. For anemia 04/04/13   [name] Love, PA-C  losartan (COZAAR) 50 MG tablet Take 50 mg by mouth daily.    Historical Provider, MD  mupirocin ointment (BACTROBAN) 2 %  03/19/13   Historical Provider, MD  polyethylene glycol (MIRALAX / GLYCOLAX) packet Take 17 g by mouth 2 (two) times daily. For constipation. 04/04/13   Bary Leriche, PA-C  tamsulosin (FLOMAX) 0.4 MG CAPS Take 1 capsule (0.4 mg total) by mouth 2 (two) times daily.  Dose increased to help bladder function. 04/04/13   Bary Leriche, PA-C    Family History Family History  Problem Relation Age of Onset  . Diabetes Mother     Social History Social History  Substance Use Topics  . Smoking status: Never Smoker  . Smokeless tobacco: Never Used  . Alcohol use No     Allergies   Patient has no known allergies.   Review of Systems Review of Systems  Constitutional: Negative for appetite change, chills, diaphoresis, fatigue, fever and unexpected weight change.  HENT: Negative for congestion, rhinorrhea and sneezing.   Eyes: Negative for visual disturbance.  Respiratory: Negative for cough, chest tightness and shortness of breath.   Cardiovascular: Negative for chest pain and palpitations.  Gastrointestinal: Negative for abdominal pain, constipation, diarrhea, nausea and vomiting.  Genitourinary: Negative for dysuria and urgency.  Musculoskeletal: Negative for neck pain.  Skin: Negative for rash.  Neurological: Negative for dizziness, light-headedness and headaches.       Positive for weakness but resolved   Hematological: Does not bruise/bleed easily.   Physical Exam Updated Vital Signs BP 103/57 (BP Location: Right Arm)   Pulse (!) 56   Temp 98.7 F (37.1 C) (Oral)   Resp 18   Ht 5' 5.5" (1.664 m)   Wt 91.4 kg   SpO2 100%   BMI 33.00 kg/m   Physical Exam GEN: appears well, no apparent distress. Head: normocephalic and atraumatic  Eyes: conjunctiva without injection, sclera anicteric, positive for arcus seniles   Ears: ear wax impaction in right ear, left ear normal Nares: no rhinorrhea, congestion or erythema  Oropharynx: mmm without erythema or exudation, wear denture HEM: negative for cervical or periauricular lymphadenopathies CVS: HR 56/min, RR, nl s1 & s2, no murmurs, no edema RESP: speaks in full sentence, no IWOB, CTAB GI: BS present & normal, soft, NTND GU: no suprapubic or CVA tenderness MSK: frozen right shoulder, no  notable swelling or tenderness over his back, hip or legs.  SKIN: hyperpigmentation of his palm skin NEURO: Awake, alert and oriented appropriately. Cranial nerves II-XII  intact, motor 5/5 in all muscle groups of UE and LE bilaterally, normal tone, light sensation intact in all dermatomes of upper and lower ext bilaterally, patellar reflex 1+ bilaterally, gait slow but normal PSYCH: euthymic mood with congruent affect  ED Treatments / Results  Labs (all labs ordered are listed, but only abnormal results are displayed) Labs Reviewed - No data to display  EKG  EKG Interpretation  Date/Time:  Saturday January 13 2017 11:50:21 EST Ventricular Rate:  65 PR Interval:  166 QRS Duration: 86 QT Interval:  444 QTC Calculation: 461 R Axis:   0 Text Interpretation:  Sinus rhythm with frequent Premature ventricular complexes Minimal voltage criteria for LVH, may be normal variant Borderline ECG No significant change since last tracing Confirmed by [facility] MD,  ERIN ([id number]) on 01/13/2017 2:17:53 PM       Radiology No results found.  Procedures Procedures (including critical care time)  Medications Ordered in ED Medications - No data to display   Initial Impression / Assessment and Plan / ED Course  I have reviewed the triage vital signs and the nursing notes.  Pertinent labs & imaging results that were available during my care of the patient were reviewed by me and considered in my medical decision making (see chart for details).  Right leg weakness: this seems to be a chronic issue. Doubt CVA without significant neuro finding. He has no problem with ambulation. He didn't feel dizzy or lightheaded when he got up and walked in the room. He has history of myelopathy and multiple myeloma which could be contributing to his intermittent leg weakness. This could be worked up outpatient. Obviously, he would benefit from walker. He also has right frozen shoulder. Another reason for outpatient  physical therapy and ?ortho referral. I recommended discussing with PCP. Patient, wife and daughter are on board with this.   Bradycardia with PVCs: noted heart rate was 42 initially. It was 56 on my exam and 67 on EKG. PVC's were also noted on his old EKG. He is asymptomatic from this. Recommended follow up with cardiology as outpatient on this.   Frozen Right Shoulder: PT and outpatient evaluation as above  Right ear impaction: debrox gtt  Final Clinical Impressions(s) / ED Diagnoses   Final diagnoses:  Right leg weakness    New Prescriptions Discharge Medication List as of 01/13/2017  2:49 PM       Mercy Riding, MD 01/13/17 2053    Gareth Morgan, MD 01/17/17 1031

## 2017-01-13 NOTE — ED Notes (Signed)
Pt brady and hypotensive this EMT preformed EKG and made charge and triage nurse aware and showed EKG to same.

## 2017-06-10 ENCOUNTER — Emergency Department (HOSPITAL_COMMUNITY): Payer: Medicare Other

## 2017-06-10 ENCOUNTER — Emergency Department (HOSPITAL_COMMUNITY)
Admission: EM | Admit: 2017-06-10 | Discharge: 2017-06-10 | Disposition: A | Discharge status: Home / Self Care | Payer: Medicare Other | Attending: Emergency Medicine | Admitting: Emergency Medicine

## 2017-06-10 ENCOUNTER — Encounter (HOSPITAL_COMMUNITY): Payer: Self-pay

## 2017-06-10 DIAGNOSIS — Z7982 Long term (current) use of aspirin: Secondary | ICD-10-CM | POA: Insufficient documentation

## 2017-06-10 DIAGNOSIS — Z79899 Other long term (current) drug therapy: Secondary | ICD-10-CM | POA: Insufficient documentation

## 2017-06-10 DIAGNOSIS — I10 Essential (primary) hypertension: Secondary | ICD-10-CM | POA: Diagnosis not present

## 2017-06-10 DIAGNOSIS — R55 Syncope and collapse: Secondary | ICD-10-CM | POA: Insufficient documentation

## 2017-06-10 LAB — I-STAT TROPONIN, ED
Troponin i, poc: 0.02 ng/mL (ref 0.00–0.08)
Troponin i, poc: 0.01 ng/mL (ref 0.00–0.08)

## 2017-06-10 LAB — BASIC METABOLIC PANEL
Anion gap: 8 (ref 5–15)
BUN: 21 mg/dL — AB (ref 6–20)
CHLORIDE: 106 mmol/L (ref 101–111)
CO2: 26 mmol/L (ref 22–32)
CREATININE: 1.63 mg/dL — AB (ref 0.61–1.24)
Calcium: 9.4 mg/dL (ref 8.9–10.3)
GFR calc Af Amer: 46 mL/min — ABNORMAL LOW (ref >60)
GFR, EST NON AFRICAN AMERICAN: 39 mL/min — AB (ref >60)
GLUCOSE: 125 mg/dL — AB (ref 65–99)
POTASSIUM: 4 mmol/L (ref 3.5–5.1)
Sodium: 140 mmol/L (ref 135–145)

## 2017-06-10 LAB — CBC
HEMATOCRIT: 35.7 % — AB (ref 39.0–52.0)
Hemoglobin: 11.6 g/dL — ABNORMAL LOW (ref 13.0–17.0)
MCH: 31.8 pg (ref 26.0–34.0)
MCHC: 32.5 g/dL (ref 30.0–36.0)
MCV: 97.8 fL (ref 78.0–100.0)
PLATELETS: 214 10*3/uL (ref 150–400)
RBC: 3.65 MIL/uL — ABNORMAL LOW (ref 4.22–5.81)
RDW: 15.8 % — AB (ref 11.5–15.5)
WBC: 4.6 10*3/uL (ref 4.0–10.5)

## 2017-06-10 MED ORDER — SODIUM CHLORIDE 0.9 % IV BOLUS (SEPSIS)
500.0000 mL | Freq: Once | INTRAVENOUS | Status: AC
  Administered 2017-06-10: 500 mL via INTRAVENOUS

## 2017-06-10 NOTE — ED Notes (Signed)
EDP at bedside  

## 2017-06-10 NOTE — ED Notes (Signed)
Pt. Ambulated to restroom with steady gait at this time.

## 2017-06-10 NOTE — ED Provider Notes (Signed)
Received signout at beginning of shift.  Pt here with presyncopal episode today while at church.  Hx of Multiple Myeloma.  Hx of PAC/PVC, currently being followed by cardiology. Currently going through a chest pain work up (pt denies CP). If delta trop is negative, then pt can be d/c and f/u with cardiology.    Does have documented bradycardia (HR 51). No recent medication changes.    Pt did not eat this morning and he only urinated once.  Suspect that may cause his near syncopal episode.  \  6:47 PM Normal delta troponin. Patient has no chest pain. Patient is stable for discharge. He will follow-up closely with his PCP and his cardiologist further care. Return precaution discussed.  BP 138/68   Pulse (!) 54   Temp 98 F (36.7 C) (Oral)   Resp 19   Ht '5\' 6"'$  (1.676 m)   Wt 98.9 kg (218 lb)   SpO2 96%   BMI 35.19 kg/m   Results for orders placed or performed during the hospital encounter of 93/81/82  Basic metabolic panel  Result Value Ref Range   Sodium 140 135 - 145 mmol/L   Potassium 4.0 3.5 - 5.1 mmol/L   Chloride 106 101 - 111 mmol/L   CO2 26 22 - 32 mmol/L   Glucose, Bld 125 (H) 65 - 99 mg/dL   BUN 21 (H) 6 - 20 mg/dL   Creatinine, Ser 1.63 (H) 0.61 - 1.24 mg/dL   Calcium 9.4 8.9 - 10.3 mg/dL   GFR calc non Af Amer 39 (L) >60 mL/min   GFR calc Af Amer 46 (L) >60 mL/min   Anion gap 8 5 - 15  CBC  Result Value Ref Range   WBC 4.6 4.0 - 10.5 K/uL   RBC 3.65 (L) 4.22 - 5.81 MIL/uL   Hemoglobin 11.6 (L) 13.0 - 17.0 g/dL   HCT 35.7 (L) 39.0 - 52.0 %   MCV 97.8 78.0 - 100.0 fL   MCH 31.8 26.0 - 34.0 pg   MCHC 32.5 30.0 - 36.0 g/dL   RDW 15.8 (H) 11.5 - 15.5 %   Platelets 214 150 - 400 K/uL  I-Stat Troponin, ED (not at [facility])  Result Value Ref Range   Troponin i, poc 0.02 0.00 - 0.08 ng/mL   Comment 3          I-Stat Troponin, ED (not at [facility])  Result Value Ref Range   Troponin i, poc 0.01 0.00 - 0.08 ng/mL   Comment 3           Dg Chest 2 View  Result Date:  06/10/2017 CLINICAL DATA:  Patient states that while at church he overheated and passed out. Feeling much better. EXAM: CHEST  2 VIEW COMPARISON:  03/19/2013 FINDINGS: Thoracolumbar spine fixation. Lateral view degraded by patient arm position. Moderate thoracic spondylosis. Numerous leads and wires project over the chest. Midline trachea. Moderate cardiomegaly No pleural effusion or pneumothorax. No congestive failure. IMPRESSION: No acute cardiopulmonary disease. Cardiomegaly without congestive failure. Electronically Signed   By: Abigail Miyamoto M.D.   On: 06/10/2017 15:06       Domenic Moras, PA-C 06/10/17 1847

## 2017-06-10 NOTE — Discharge Instructions (Signed)
Please follow up with cardiology in the next week.  Please make sure to let Dr. Wendee Beavers know that you were seen and evaluated in the Emergency Department today.

## 2017-06-10 NOTE — ED Notes (Signed)
Patient transported to X-ray 

## 2017-06-10 NOTE — ED Provider Notes (Signed)
Anthony Walls Provider Note   CSN: [id number] Arrival date & time: 06/10/17  1205     History   Chief Complaint Chief Complaint  Patient presents with  . Loss of Consciousness    HPI Anthony Walls is a 76 y.o. male w/ a h/o of multiple myeloma who presents to the emergency department with nausea, diaphoresis, and lightheadedness that occurred for approximately 60 seconds while the patient was sitting at church. Family reports that the patient did not have a syncopal episode. No history of similar. He denies shortness of breath, chest pain, abdominal pain, emesis, headache, or dizziness. The patient reports that he feels at his baseline.  EMS reports the original rhythm was bigeminy with no perfusion of PVC.  Past medical history includes CKD stage III, prostate CA, His wife reports that he was evaluated by his hematologist 3 days ago and had an injection of XGeva and will restart taking Revlimid tomorrow. He was evaluated for PVCs by cardiology in 7/17 with a 24-hour holter monitor, which demonstrated frequent PVCs and PACs.   The history is provided by the patient and the spouse.    Past Medical History:  Diagnosis Date  . Arthritis   . Hesitancy    ?BPH  . Hypertension   . Multiple myeloma [facility]) 2014    Patient Active Problem List   Diagnosis Date Noted  . 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 Acute blood loss anemia 03/26/2013  . Renal insufficiency 03/26/2013    Past Surgical History:  Procedure Laterality Date  . BRAIN SURGERY  98   tumor  . LAMINECTOMY N/A 03/20/2013   Procedure: THORACIC LAMINECTOMY FOR TUMOR;  Surgeon: [name], MD;  Location: [place] NEURO ORS;  Service: Neurosurgery;  Laterality: N/A;  Thoracic Laminectomy for Thoracic Ten Tumor, Pedicle Screws at Thoracic Nine through Eleven  . TRANSURETHRAL RESECTION OF PROSTATE         Home Medications    Prior to Admission  medications   Medication Sig Start Date End Date Taking? Authorizing Provider  aspirin EC 81 MG tablet Take 81 mg by mouth every morning.   Yes [provider]  calcium-vitamin D (OSCAL WITH D) 500-200 MG-UNIT tablet Take 1 tablet by mouth 2 (two) times daily.   Yes [provider]  cholecalciferol (VITAMIN D) 1000 units tablet Take 2,000 Units by mouth every morning.   Yes [provider]  donepezil (ARICEPT) 5 MG tablet Take 5 mg by mouth at bedtime.   Yes [provider]  furosemide (LASIX) 20 MG tablet Take 20 mg by mouth at bedtime.   Yes [provider]  PARoxetine (PAXIL) 20 MG tablet Take 20 mg by mouth every morning.   Yes [provider]  psyllium (METAMUCIL) 58.6 % powder Take 1 packet by mouth as needed (fiber).   Yes [provider]  tamsulosin (FLOMAX) 0.4 MG CAPS Take 1 capsule (0.4 mg total) by mouth 2 (two) times daily. Dose increased to help bladder function. Patient taking differently: Take 0.4 mg by mouth daily after breakfast.  04/04/13  Yes Love, [name], PA-C  acetaminophen (TYLENOL) 325 MG tablet Take 1-2 tablets (325-650 mg total) by mouth every 4 (four) hours as needed. Patient taking differently: Take 325-650 mg by mouth every 4 (four) hours as needed for mild pain.  04/04/13   Love, [name], PA-C  bethanechol (URECHOLINE) 25 MG tablet Taper as below: Take one pill  four time a day--05/2-05/09/14 Decrease to one pill three times a day from -05/10-05/16/14 Decrease to one pill twice a day from--05/17-05/23 Then take one pill daily till gone. Patient not taking: Reported on 06/10/2017 04/04/13   Bary Leriche, PA-C  iron polysaccharides (NIFEREX) 150 MG capsule Take 1 capsule (150 mg total) by mouth 2 (two) times daily before lunch and supper. For anemia Patient not taking: Reported on 06/10/2017 04/04/13   Love, [name], PA-C  polyethylene glycol (MIRALAX / GLYCOLAX) packet Take 17 g by mouth 2 (two) times daily. For  constipation. Patient not taking: Reported on 06/10/2017 04/04/13   Bary Leriche, PA-C    Family History Family History  Problem Relation Age of Onset  . Diabetes Mother     Social History Social History  Substance Use Topics  . Smoking status: Never Smoker  . Smokeless tobacco: Never Used  . Alcohol use No     Allergies   Iron polysacch [id number]   Review of Systems Review of Systems  Constitutional: Positive for diaphoresis. Negative for activity change.  Respiratory: Negative for shortness of breath.   Cardiovascular: Negative for chest pain.  Gastrointestinal: Positive for nausea. Negative for abdominal pain and vomiting.  Musculoskeletal: Negative for back pain.  Skin: Negative for rash.  Neurological: Positive for light-headedness.   Physical Exam Updated Vital Signs BP 132/67   Pulse (!) 56   Temp 98 F (36.7 C) (Oral)   Resp 14   Ht '5\' 6"'$  (1.676 m)   Wt 98.9 kg (218 lb)   SpO2 100%   BMI 35.19 kg/m   Physical Exam  Constitutional: He appears well-developed.  HENT:  Head: Normocephalic.  Eyes: Conjunctivae are normal.  Neck: Neck supple.  Cardiovascular: Normal rate and regular rhythm.  Exam reveals no gallop and no friction rub.   No murmur heard. Pulmonary/Chest: Effort normal and breath sounds normal. No respiratory distress. He has no wheezes. He has no rales.  Abdominal: Soft. Bowel sounds are normal. He exhibits no distension. There is no tenderness. There is no rebound and no guarding.  Musculoskeletal: Normal range of motion. He exhibits no edema, tenderness or deformity.  Neurological: He is alert.  Cranial nerves 2-12 intact. Finger-to-nose is normal. 5/5 motor strength of the bilateral upper and lower extremities. Moves all four extremities. Negative Romberg. Ambulatory without difficulty. NVI.    Skin: Skin is warm and dry. Capillary refill takes less than 2 seconds.  Psychiatric: His behavior is normal.  Nursing note and vitals  reviewed.    ED Treatments / Results  Labs (all labs ordered are listed, but only abnormal results are displayed) Labs Reviewed  BASIC METABOLIC PANEL - Abnormal; Notable for the following:       Result Value   Glucose, Bld 125 (*)    BUN 21 (*)    Creatinine, Ser 1.63 (*)    GFR calc non Af Amer 39 (*)    GFR calc Af Amer 46 (*)    All other components within normal limits  CBC - Abnormal; Notable for the following:    RBC 3.65 (*)    Hemoglobin 11.6 (*)    HCT 35.7 (*)    RDW 15.8 (*)    All other components within normal limits  Randolm Idol, ED  Randolm Idol, ED    EKG  EKG Interpretation  Date/Time:  Sunday June 10 2017 12:31:02 EDT Ventricular Rate:  85 PR Interval:    QRS Duration: 99 QT Interval:  439 QTC Calculation: 504 R Axis:   -3 Text Interpretation:  Sinus rhythm Multiform ventricular premature complexes Left ventricular hypertrophy Prolonged QT interval Otherwise no significant change Confirmed by Addison Lank [phone]) on 06/10/2017 1:58:16 PM       Radiology Dg Chest 2 View  Result Date: 06/10/2017 CLINICAL DATA:  Patient states that while at church he overheated and passed out. Feeling much better. EXAM: CHEST  2 VIEW COMPARISON:  03/19/2013 FINDINGS: Thoracolumbar spine fixation. Lateral view degraded by patient arm position. Moderate thoracic spondylosis. Numerous leads and wires project over the chest. Midline trachea. Moderate cardiomegaly No pleural effusion or pneumothorax. No congestive failure. IMPRESSION: No acute cardiopulmonary disease. Cardiomegaly without congestive failure. Electronically Signed   By: Abigail Miyamoto M.D.   On: 06/10/2017 15:06    Procedures Procedures (including critical care time)  Medications Ordered in ED Medications  sodium chloride 0.9 % bolus 500 mL (0 mLs Intravenous Stopped 06/10/17 1944)     Initial Impression / Assessment and Plan / ED Course  I have reviewed the triage vital signs and the nursing  notes.  Pertinent labs & imaging results that were available during my care of the patient were reviewed by me and considered in my medical decision making (see chart for details).     Patient with near-syncopal episode. The patient was see and evaluated by Dr Leonette Monarch, attending physician. CXR with cardiomegaly, but unchanged from previous. EKG with PVCs and PACs, unchanged from previous. The patient has had an OP ECHO, stress testInitial, and holter monitor within the last 1.5 years. Initial troponin negative.Cr 1.63 slightly elevated baseline of 1.4-1.5. Small fluid bolus given in the ED. CBC unremarkable. Second troponin and UA pending.   Patient care transferred to PA [facility] at the end of my shift. Patient presentation, ED course, and plan of care discussed with review of all pertinent labs and imaging. Please see his/her note for further details regarding further ED course and disposition.   Final Clinical Impressions(s) / ED Diagnoses   Final diagnoses:  Syncope, near    New Prescriptions Discharge Medication List as of 06/10/2017  6:43 PM       Joanne Gavel, PA-C 06/12/17 0102    Fatima Blank, MD 06/14/17 0013

## 2017-06-10 NOTE — ED Triage Notes (Addendum)
Pt arrives EMS from church where he had episode of dizziness diaphoretic and nauseated. EMS states orriginal rhythm was bigemany with no perfusion of PVC. Denies chest pain.

## 2017-06-10 NOTE — ED Notes (Signed)
[facility] Dr. Wendee Beavers oncology told daughter that if patient had an episode of diaphoresis and syncope or near syncope to bring him into hospital. Pt. Being treated for multiple myeloma and has recently been taken of Relivimad because of low blood counts. Pt. To start back on medication on Monday.

## 2017-06-12 ENCOUNTER — Encounter (HOSPITAL_COMMUNITY): Payer: Self-pay | Admitting: *Deleted

## 2017-06-12 DIAGNOSIS — Z79899 Other long term (current) drug therapy: Secondary | ICD-10-CM | POA: Insufficient documentation

## 2017-06-12 DIAGNOSIS — N189 Chronic kidney disease, unspecified: Secondary | ICD-10-CM | POA: Diagnosis not present

## 2017-06-12 DIAGNOSIS — I129 Hypertensive chronic kidney disease with stage 1 through stage 4 chronic kidney disease, or unspecified chronic kidney disease: Secondary | ICD-10-CM | POA: Diagnosis not present

## 2017-06-12 DIAGNOSIS — M6281 Muscle weakness (generalized): Secondary | ICD-10-CM | POA: Diagnosis not present

## 2017-06-12 DIAGNOSIS — Z7982 Long term (current) use of aspirin: Secondary | ICD-10-CM | POA: Diagnosis not present

## 2017-06-12 LAB — BASIC METABOLIC PANEL
Anion gap: 7 (ref 5–15)
BUN: 24 mg/dL — ABNORMAL HIGH (ref 6–20)
CHLORIDE: 106 mmol/L (ref 101–111)
CO2: 27 mmol/L (ref 22–32)
CREATININE: 1.72 mg/dL — AB (ref 0.61–1.24)
Calcium: 8.6 mg/dL — ABNORMAL LOW (ref 8.9–10.3)
GFR calc non Af Amer: 37 mL/min — ABNORMAL LOW (ref >60)
GFR, EST AFRICAN AMERICAN: 43 mL/min — AB (ref >60)
Glucose, Bld: 130 mg/dL — ABNORMAL HIGH (ref 65–99)
POTASSIUM: 3.9 mmol/L (ref 3.5–5.1)
Sodium: 140 mmol/L (ref 135–145)

## 2017-06-12 LAB — CBC
HCT: 34.1 % — ABNORMAL LOW (ref 39.0–52.0)
HEMOGLOBIN: 10.8 g/dL — AB (ref 13.0–17.0)
MCH: 31.2 pg (ref 26.0–34.0)
MCHC: 31.7 g/dL (ref 30.0–36.0)
MCV: 98.6 fL (ref 78.0–100.0)
PLATELETS: 198 10*3/uL (ref 150–400)
RBC: 3.46 MIL/uL — AB (ref 4.22–5.81)
RDW: 16 % — ABNORMAL HIGH (ref 11.5–15.5)
WBC: 3.2 10*3/uL — AB (ref 4.0–10.5)

## 2017-06-12 LAB — I-STAT TROPONIN, ED: Troponin i, poc: 0.01 ng/mL (ref 0.00–0.08)

## 2017-06-12 NOTE — ED Triage Notes (Signed)
Pt was seen for same on Sunday and discharged with dx of near syncope.  Pt was eating today when he became suddenly weak and sweaty.  No sob, no CP, no palpitations.  Pt did not loose consciousness

## 2017-06-13 ENCOUNTER — Emergency Department (HOSPITAL_COMMUNITY)
Admission: EM | Admit: 2017-06-13 | Discharge: 2017-06-13 | Disposition: A | Discharge status: Home / Self Care | Payer: Medicare Other | Attending: Emergency Medicine | Admitting: Emergency Medicine

## 2017-06-13 DIAGNOSIS — N189 Chronic kidney disease, unspecified: Secondary | ICD-10-CM

## 2017-06-13 DIAGNOSIS — R531 Weakness: Secondary | ICD-10-CM

## 2017-06-13 LAB — URINALYSIS, ROUTINE W REFLEX MICROSCOPIC
BILIRUBIN URINE: NEGATIVE
GLUCOSE, UA: NEGATIVE mg/dL
HGB URINE DIPSTICK: NEGATIVE
KETONES UR: NEGATIVE mg/dL
NITRITE: NEGATIVE
PROTEIN: 30 mg/dL — AB
Specific Gravity, Urine: 1.021 (ref 1.005–1.030)
pH: 7 (ref 5.0–8.0)

## 2017-06-13 LAB — CK: Total CK: 238 U/L (ref 49–397)

## 2017-06-13 MED ORDER — LACTATED RINGERS IV BOLUS (SEPSIS)
1000.0000 mL | Freq: Once | INTRAVENOUS | Status: AC
  Administered 2017-06-13: 1000 mL via INTRAVENOUS

## 2017-06-13 NOTE — ED Notes (Signed)
E-signature pad not working; instructions explained to pt, wife, and daughter. All expressed understanding

## 2017-06-13 NOTE — ED Provider Notes (Signed)
[facility] DEPT Provider Note   CSN: [id number] Arrival date & time: 06/12/17  2014  By signing my name below, I, Anthony Walls, attest that this documentation has been prepared under the direction and in the presence of [name], MD. Electronically Signed: [name], ED Scribe. 06/13/17. 1:28 AM.  History   Chief Complaint Chief Complaint  Patient presents with  . Near Syncope   The history is provided by the patient and medical records. No language interpreter was used.    HPI Comments: Ha Anthony Walls is a 76 y.o. male with a PMhx of HTN, multiple myeloma, who presents to the Emergency Department s/p a resolved episode of generalized weakness and diaphoresis which occurred approximately 6-7 hours ago. Pt reports that he was in the middle of eating in dinner at a restaurant yesterday evening when he had an onset of generalized weakness and breaking out into a sweat. He attributes this to possibly being overheated and reports that after getting in his car to come into the ED and turning on the air the his symptoms promptly resolved. He denies chest pain, shortness of breath, dizziness, sensation of palpitations with this episode. No repeat episodes tonight since. Pt was seen for same in the ED three days ago; at that time he had workup including CXR, EKG, and CBC/BMP which were all largely unremarkable. His symptoms resolved while en route to the ED at that time and following discharge he reports that he has otherwise been at his baseline state of health. His symptoms were attributed to near-syncope at that time. He denies nocturnal hyperhidrosis, anorexia, diarrhea, vomiting, chest pain, shortness of breath, cough, palpitations, abdominal pain, fever, or any other associated symptoms.   Past Medical History:  Diagnosis Date  . Arthritis   . Hesitancy    ?BPH  . Hypertension   . Multiple myeloma [name]) 2014   Patient Active Problem List   Diagnosis Date Noted    . 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 Acute blood loss anemia 03/26/2013  . Renal insufficiency 03/26/2013   Past Surgical History:  Procedure Laterality Date  . BRAIN SURGERY  98   tumor  . LAMINECTOMY N/A 03/20/2013   Procedure: THORACIC LAMINECTOMY FOR TUMOR;  Surgeon: Anthony Ghee, MD;  Location: [facility];  Service: Neurosurgery;  Laterality: N/A;  Thoracic Laminectomy for Thoracic Ten Tumor, Pedicle Screws at Thoracic Nine through Eleven  . TRANSURETHRAL RESECTION OF PROSTATE      Home Medications    Prior to Admission medications   Medication Sig Start Date End Date Taking? Authorizing Provider  acetaminophen (TYLENOL) 325 MG tablet Take 1-2 tablets (325-650 mg total) by mouth every 4 (four) hours as needed. Patient taking differently: Take 325-650 mg by mouth every 4 (four) hours as needed for mild pain.  04/04/13   Love, [name], PA-C  aspirin EC 81 MG tablet Take 81 mg by mouth every morning.    [provider]  bethanechol (URECHOLINE) 25 MG tablet Taper as below: Take one pill four time a day--05/2-05/09/14 Decrease to one pill three times a day from -05/10-05/16/14 Decrease to one pill twice a day from--05/17-05/23 Then take one pill daily till gone. Patient not taking: Reported on 06/10/2017 04/04/13   Love, [name], PA-C  calcium-vitamin D (OSCAL WITH D) 500-200 MG-UNIT tablet Take 1 tablet by mouth 2 (two) times daily.    [provider]  cholecalciferol (VITAMIN D)  1000 units tablet Take 2,000 Units by mouth every morning.    [provider]  donepezil (ARICEPT) 5 MG tablet Take 5 mg by mouth at bedtime.    [provider]  furosemide (LASIX) 20 MG tablet Take 20 mg by mouth at bedtime.    [provider]  iron polysaccharides (NIFEREX) 150 MG capsule Take 1 capsule (150 mg total) by mouth 2 (two) times daily before lunch and supper. For anemia Patient not  taking: Reported on 06/10/2017 04/04/13   Love, [name], PA-C  PARoxetine (PAXIL) 20 MG tablet Take 20 mg by mouth every morning.    [provider]  polyethylene glycol (MIRALAX / GLYCOLAX) packet Take 17 g by mouth 2 (two) times daily. For constipation. Patient not taking: Reported on 06/10/2017 04/04/13   Love, [name], PA-C  psyllium (METAMUCIL) 58.6 % powder Take 1 packet by mouth as needed (fiber).    [provider]  tamsulosin (FLOMAX) 0.4 MG CAPS Take 1 capsule (0.4 mg total) by mouth 2 (two) times daily. Dose increased to help bladder function. Patient taking differently: Take 0.4 mg by mouth daily after breakfast.  04/04/13   Love, [name], PA-C   Family History Family History  Problem Relation Age of Onset  . Diabetes Mother    Social History Social History  Substance Use Topics  . Smoking status: Never Smoker  . Smokeless tobacco: Never Used  . Alcohol use No   Allergies   Iron polysacch cmplx-b12-fa  Review of Systems Review of Systems A complete review of systems was obtained and all systems are negative except as noted in the HPI and PMH.   Physical Exam Updated Vital Signs BP 130/78   Pulse 69   Temp 98.2 F (36.8 C) (Oral)   Resp 17   Ht '5\' 6"'$  (1.676 m)   Wt 98.9 kg (218 lb)   SpO2 99%   BMI 35.19 kg/m   Physical Exam  Constitutional: He appears well-developed and well-nourished. No distress.  HENT:  Head: Normocephalic and atraumatic.  Eyes: Conjunctivae are normal.  Neck: Normal range of motion.  Cardiovascular: Normal rate, regular rhythm and normal heart sounds.   No murmur heard. 2+ radial pulses b/l.   Pulmonary/Chest: Effort normal and breath sounds normal. No respiratory distress. He has no wheezes. He has no rales.  Abdominal: Soft. He exhibits no distension. There is no tenderness.  Musculoskeletal: Normal range of motion. He exhibits edema. He exhibits no tenderness.  Trace edema to the BLE.   Neurological: He is alert.    Skin: No pallor.  Psychiatric: He has a normal mood and affect. His behavior is normal.  Nursing note and vitals reviewed.  ED Treatments / Results  DIAGNOSTIC STUDIES: Oxygen Saturation is 99% on RA, normal by my interpretation.   COORDINATION OF CARE: 1:28 AM-Discussed next steps with pt. Pt verbalized understanding and is agreeable with the plan.   Labs (all labs ordered are listed, but only abnormal results are displayed) Labs Reviewed  BASIC METABOLIC PANEL - Abnormal; Notable for the following:       Result Value   Glucose, Bld 130 (*)    BUN 24 (*)    Creatinine, Ser 1.72 (*)    Calcium 8.6 (*)    GFR calc non Af Amer 37 (*)    GFR calc Af Amer 43 (*)    All other components within normal limits  CBC - Abnormal; Notable for the following:  WBC 3.2 (*)    RBC 3.46 (*)    Hemoglobin 10.8 (*)    HCT 34.1 (*)    RDW 16.0 (*)    All other components within normal limits  URINALYSIS, ROUTINE W REFLEX MICROSCOPIC - Abnormal; Notable for the following:    APPearance HAZY (*)    Protein, ur 30 (*)    Leukocytes, UA TRACE (*)    Bacteria, UA RARE (*)    Squamous Epithelial / LPF 0-5 (*)    All other components within normal limits  CK  CBG MONITORING, ED  I-STAT TROPOININ, ED   EKG  EKG Interpretation  Date/Time:  Tuesday June 12 2017 20:50:13 EDT Ventricular Rate:  78 PR Interval:  176 QRS Duration: 76 QT Interval:  412 QTC Calculation: 469 R Axis:   -13 Text Interpretation:  Sinus rhythm with Premature atrial complexes with Abberant conduction Moderate voltage criteria for LVH, may be normal variant Borderline ECG No acute changes No significant change since last tracing Confirmed by [name] [phone]) on 06/13/2017 12:48:20 AM Also confirmed by [name] [phone]), editor Hattie Perch ([id number])  on 06/13/2017 6:45:12 AM      Radiology No results found.  Procedures Procedures   Medications Ordered in ED Medications  lactated ringers  bolus 1,000 mL (0 mLs Intravenous Stopped 06/13/17 0259)    Initial Impression / Assessment and Plan / ED Course  I have reviewed the triage vital signs and the nursing notes.  Pertinent labs & imaging results that were available during my care of the patient were reviewed by me and considered in my medical decision making (see chart for details).     DDx includes: Orthostatic hypotension Vertebral artery dissection/stenosis Dysrhythmia PE Vasovagal/neurocardiogenic syncope Aortic stenosis Valvular disorder/Cardiomyopathy Cancer related complication Chemo related complication  Pt with hx of multiple myeloma, CKD, prostate CA comes in with an episode of diaphoresis and weakness. He also had near syncope last week with diaphoresis at church.  Pt's VSS and WNL. His EKG is reassuring.  HR in the 60s and O2 sats in the 100s. Pt has CKD. Pre-test probability for PE is extremely low, and we will not pursue that diagnosis.  Pt has no infection like symptoms. I think his symptoms are possibly from the multiple myeloma or from the chemo itself. Will observe in the ER and reassess to see if further testing is needed.  Final Clinical Impressions(s) / ED Diagnoses   Final diagnoses:  Weakness generalized  Chronic kidney disease, unspecified CKD stage   New Prescriptions Discharge Medication List as of 06/13/2017  3:16 AM     I personally performed the services described in this documentation, which was scribed in my presence. The recorded information has been reviewed and is accurate.     [name], MD 06/13/17 [phone]

## 2017-06-13 NOTE — Discharge Instructions (Signed)
We saw you in the ER for the sweating spell. All the results in the ER are normal - EXCEPE FOR YOUR KIDNEY ENZYME CALLED CREATININE has bumped slightly to 1.7. Given that multiple myeloma can cause kidney injury, please have the kidney enzyme checked in 1 week by your primary doctor and hydrate well.  We are not sure what is causing your symptoms of sweating. We would like you to ask you cancer doctors if the sweating spells are related to multiple myeloma or reaction to any of your medicines. The workup in the ER is not complete, and is limited to screening for life threatening and emergent conditions only, so please see a primary care doctor for further evaluation.

## 2017-06-16 ENCOUNTER — Observation Stay (HOSPITAL_COMMUNITY)
Admission: EM | Admit: 2017-06-16 | Discharge: 2017-06-18 | Disposition: A | Discharge status: Home / Self Care | Payer: Medicare Other | Attending: Internal Medicine | Admitting: Internal Medicine

## 2017-06-16 ENCOUNTER — Encounter (HOSPITAL_COMMUNITY): Payer: Self-pay

## 2017-06-16 DIAGNOSIS — Z8579 Personal history of other malignant neoplasms of lymphoid, hematopoietic and related tissues: Secondary | ICD-10-CM

## 2017-06-16 DIAGNOSIS — Z7982 Long term (current) use of aspirin: Secondary | ICD-10-CM | POA: Insufficient documentation

## 2017-06-16 DIAGNOSIS — R112 Nausea with vomiting, unspecified: Secondary | ICD-10-CM

## 2017-06-16 DIAGNOSIS — R55 Syncope and collapse: Secondary | ICD-10-CM | POA: Diagnosis not present

## 2017-06-16 DIAGNOSIS — I129 Hypertensive chronic kidney disease with stage 1 through stage 4 chronic kidney disease, or unspecified chronic kidney disease: Secondary | ICD-10-CM | POA: Diagnosis not present

## 2017-06-16 DIAGNOSIS — N183 Chronic kidney disease, stage 3 unspecified: Secondary | ICD-10-CM

## 2017-06-16 DIAGNOSIS — N179 Acute kidney failure, unspecified: Secondary | ICD-10-CM

## 2017-06-16 DIAGNOSIS — R42 Dizziness and giddiness: Secondary | ICD-10-CM | POA: Diagnosis present

## 2017-06-16 DIAGNOSIS — Z79899 Other long term (current) drug therapy: Secondary | ICD-10-CM | POA: Insufficient documentation

## 2017-06-16 DIAGNOSIS — N2889 Other specified disorders of kidney and ureter: Secondary | ICD-10-CM

## 2017-06-16 DIAGNOSIS — R001 Bradycardia, unspecified: Secondary | ICD-10-CM

## 2017-06-16 LAB — COMPREHENSIVE METABOLIC PANEL
ALK PHOS: 51 U/L (ref 38–126)
ALT: 22 U/L (ref 17–63)
AST: 30 U/L (ref 15–41)
Albumin: 3.5 g/dL (ref 3.5–5.0)
Anion gap: 7 (ref 5–15)
BUN: 17 mg/dL (ref 6–20)
CALCIUM: 9 mg/dL (ref 8.9–10.3)
CO2: 26 mmol/L (ref 22–32)
CREATININE: 1.86 mg/dL — AB (ref 0.61–1.24)
Chloride: 108 mmol/L (ref 101–111)
GFR calc non Af Amer: 34 mL/min — ABNORMAL LOW (ref >60)
GFR, EST AFRICAN AMERICAN: 39 mL/min — AB (ref >60)
GLUCOSE: 107 mg/dL — AB (ref 65–99)
Potassium: 4.6 mmol/L (ref 3.5–5.1)
SODIUM: 141 mmol/L (ref 135–145)
Total Bilirubin: 0.8 mg/dL (ref 0.3–1.2)
Total Protein: 6.8 g/dL (ref 6.5–8.1)

## 2017-06-16 LAB — MAGNESIUM: MAGNESIUM: 1.8 mg/dL (ref 1.7–2.4)

## 2017-06-16 LAB — CBC WITH DIFFERENTIAL/PLATELET
Basophils Absolute: 0 10*3/uL (ref 0.0–0.1)
Basophils Relative: 0 %
EOS ABS: 0.1 10*3/uL (ref 0.0–0.7)
Eosinophils Relative: 1 %
HEMATOCRIT: 35.2 % — AB (ref 39.0–52.0)
HEMOGLOBIN: 11.2 g/dL — AB (ref 13.0–17.0)
LYMPHS ABS: 1.3 10*3/uL (ref 0.7–4.0)
LYMPHS PCT: 36 %
MCH: 30.9 pg (ref 26.0–34.0)
MCHC: 31.8 g/dL (ref 30.0–36.0)
MCV: 97.2 fL (ref 78.0–100.0)
Monocytes Absolute: 0.4 10*3/uL (ref 0.1–1.0)
Monocytes Relative: 12 %
NEUTROS ABS: 1.9 10*3/uL (ref 1.7–7.7)
NEUTROS PCT: 51 %
Platelets: 216 10*3/uL (ref 150–400)
RBC: 3.62 MIL/uL — AB (ref 4.22–5.81)
RDW: 15.8 % — ABNORMAL HIGH (ref 11.5–15.5)
WBC: 3.7 10*3/uL — AB (ref 4.0–10.5)

## 2017-06-16 LAB — URINALYSIS, ROUTINE W REFLEX MICROSCOPIC
Bilirubin Urine: NEGATIVE
GLUCOSE, UA: NEGATIVE mg/dL
HGB URINE DIPSTICK: NEGATIVE
Ketones, ur: NEGATIVE mg/dL
Leukocytes, UA: NEGATIVE
Nitrite: NEGATIVE
Protein, ur: NEGATIVE mg/dL
SPECIFIC GRAVITY, URINE: 1.013 (ref 1.005–1.030)
pH: 8 (ref 5.0–8.0)

## 2017-06-16 LAB — I-STAT TROPONIN, ED: Troponin i, poc: 0 ng/mL (ref 0.00–0.08)

## 2017-06-16 LAB — TROPONIN I

## 2017-06-16 LAB — TSH: TSH: 0.85 u[IU]/mL (ref 0.350–4.500)

## 2017-06-16 MED ORDER — ASPIRIN EC 81 MG PO TBEC: 81.0000 mg | DELAYED_RELEASE_TABLET | ORAL | Status: DC

## 2017-06-16 MED ORDER — ACETAMINOPHEN 325 MG PO TABS: 650.0000 mg | ORAL_TABLET | Freq: Four times a day (QID) | ORAL | Status: DC | PRN

## 2017-06-16 MED ORDER — ENOXAPARIN SODIUM 30 MG/0.3ML ~~LOC~~ SOLN
30.0000 mg | SUBCUTANEOUS | Status: DC
  Administered 2017-06-17: 30 mg via SUBCUTANEOUS
  Filled 2017-06-16: qty 0.3

## 2017-06-16 MED ORDER — ACETAMINOPHEN 650 MG RE SUPP: 650.0000 mg | Freq: Four times a day (QID) | RECTAL | Status: DC | PRN

## 2017-06-16 MED ORDER — SODIUM CHLORIDE 0.9% FLUSH
3.0000 mL | Freq: Two times a day (BID) | INTRAVENOUS | Status: DC
  Administered 2017-06-17 (×2): 3 mL via INTRAVENOUS

## 2017-06-16 MED ORDER — SODIUM CHLORIDE 0.9 % IV BOLUS (SEPSIS)
500.0000 mL | Freq: Once | INTRAVENOUS | Status: AC
  Administered 2017-06-16: 500 mL via INTRAVENOUS

## 2017-06-16 MED ORDER — ASPIRIN EC 81 MG PO TBEC
81.0000 mg | DELAYED_RELEASE_TABLET | Freq: Every day | ORAL | Status: DC
  Administered 2017-06-17 – 2017-06-18 (×2): 81 mg via ORAL
  Filled 2017-06-16 (×2): qty 1

## 2017-06-16 NOTE — ED Triage Notes (Signed)
Patient arrived by [facility] following diaphoretic incident while at store and 1 episode of vomiting. EMS reports that patient has multiple myloma and has had similar episode in recent past. Patient denies pain on arrival. Complains of weakness since event.

## 2017-06-16 NOTE — H&P (Signed)
Date: 06/17/2017               Patient Name:  Anthony Walls MRN: [id number]  DOB: 1941-09-21 Age / Sex: 76 y.o., male   PCP: [name], MD         Medical Service: Internal Medicine Teaching Service         Attending Physician: Dr. Bartholomew Crews, MD    First Contact: Dr. Tarri Abernethy Pager: [phone]  Second Contact: Dr. Marlowe Sax Pager: [phone]       After Hours (After 5p/  First Contact Pager: [phone]  weekends / holidays): Second Contact Pager: [phone]   Chief Complaint: Diaphoresis, Vomiting  History of Present Illness: Anthony Walls is a 76 yo M with history of multiple myeloma (currently undergoing tx), HTN, prostate cancer, remote (childhood) brain tumor s/p resection who presented to the ED after an episode of diaphoresis, near syncope, and vomiting. Portions of history obtained with help of daughter.   Anthony Walls has experienced 3 episodes of sudden onset of light-headedness and feeling hot over the past week. He was seen in the ED on 7/8 and 7/11 for his prior episodes with unremarkable workup and discharged to home. These episodes occur randomly with no identifiable trigger, last around 5 minutes and resolve spontaneously without intervention. In between, the patient reports feeling well in his usual state of health. Today, his daughter feels the episode lasted longer and he also had a large amount of vomiting before his sx again resolved. No bloody or bilious emesis. He reports normal PO intake recently. He denies fever, night sweats, abdominal pain, dysuria, confusion.   In the ED, pt was bradycardic to 40s, BP 153/82, RR 14, 98% on RA. Labs remarkable for Cr 1.86 (hx CKD),  WBC 3.7, Hgb 11.2, Plts 216. Troponin negative, U/A negative. He received 1 L IVF. The pt was admitted for further evaluation and monitoring.   Meds:  Current Meds  Medication Sig  . aspirin EC 81 MG tablet Take 81 mg by mouth every morning.  . calcium-vitamin D (OSCAL WITH D) 500-200 MG-UNIT  tablet Take 1 tablet by mouth 2 (two) times daily.  . cholecalciferol (VITAMIN D) 1000 units tablet Take 2,000 Units by mouth every morning.  Marland Kitchen PARoxetine (PAXIL) 20 MG tablet Take 20 mg by mouth every morning.     Allergies: Allergies as of 06/16/2017 - Review Complete 06/16/2017  Allergen Reaction Noted  . Iron polysacch cmplx-b12-fa Hives 05/31/2014  . Statins  06/14/2017   Past Medical History:  Diagnosis Date  . Arthritis   . Hesitancy    ?BPH  . Hypertension   . Multiple myeloma ([place]) 2014    Family History:  Family History  Problem Relation Age of Onset  . Diabetes Mother      Social History:  Social History  Substance Use Topics  . Smoking status: Never Smoker  . Smokeless tobacco: Never Used  . Alcohol use No    Review of Systems: A complete ROS was negative except as per HPI.   Physical Exam: Blood pressure (!) 140/95, pulse (!) 57, temperature 99.1 F (37.3 C), temperature source Oral, resp. rate 18, weight 220 lb 8 oz (100 kg), SpO2 99 %. Physical Exam  Constitutional: He is oriented to person, place, and time. He appears well-developed and well-nourished.  HENT:  Head: Normocephalic and atraumatic.  Mouth/Throat: Oropharynx is clear and moist.  Eyes: Pupils are equal, round, and reactive to light. EOM are normal.  Cardiovascular: Intact distal pulses.   Normal rate at time of exam, no murmur or gallop appreciated, +1 bilateral LE edema    Pulmonary/Chest: Effort normal and breath sounds normal. No respiratory distress. He has no wheezes.  Abdominal: Soft. Bowel sounds are normal. He exhibits no distension. There is no tenderness. There is no rebound and no guarding.  Genitourinary:  Genitourinary Comments: No CVA tenderness   Musculoskeletal:  No midline spinal tenderness, remote healed incisional scar on lower back, 5/5 strength in all extremities   Neurological: He is alert and oriented to person, place, and time. No cranial nerve deficit or  sensory deficit.  Skin: Skin is warm and dry. Capillary refill takes less than 2 seconds. He is not diaphoretic.  Psychiatric: He has a normal mood and affect.     EKG: personally reviewed my interpretation is sinus bradycardia with PVC.   Assessment & Plan by Problem: Active Problems:   Near syncope The pt has experienced multiple recent episodes of near syncope with no clear etiology after initial ED workup on past occasions. Today he has documented bradycardia to 40s on arrival to ED. In 01/2017 he was seen by Cardiology, evaluated with a Holter monitor which revealed, PVCs and PACs, also normal perfusion scan, no further outside information available. He was also seen at an outside ED in 05/2016 for a near syncopal event. He is currently asymptomatic and HR improved. He is not on beta blockers or other medications that would readily explain his sx or HR.     Plan: -Repeat EKG and Telemetry monitoring -Orthostatic vital signs -Echocardiogram (last 2017) -BMP, CBC, Mg, TSH, trend Troponins  Dispo: Admit patient to Observation with expected length of stay less than 2 midnights.  Signed: Tawny Asal, MD 06/17/2017, 12:12 AM  Pager: [phone]

## 2017-06-16 NOTE — ED Provider Notes (Signed)
[facility] DEPT Provider Note   CSN: [id number] Arrival date & time: 06/16/17  1553     History   Chief Complaint Chief Complaint  Patient presents with  . diaphoresis/vomiting    HPI Anthony Walls is a 76 y.o. male.  Patient with hx multiple myeloma, c/o episode feeling faint/lightheaded at store today, felt as if was about to pass out, occurred just pta.  Pt states also felt warm, flushed, nausea, had episode emesis (not bloody or bilious), and then felt sweaty.  No trauma or fall. Symptoms lasted several minutes. Notes recent similar symptoms - 2 prior ed visits with near syncope and diaphoresis in past week. Denies palpitations. No chest pain or discomfort. No sob or unusual doe. No headache. Denies abd pain. Nausea improved. Denies dysuria or gu c/o. No recent blood loss, melena, or rectal bleeding. Denies change in meds. No fever or chills.    The history is provided by the patient.    Past Medical History:  Diagnosis Date  . Arthritis   . Hesitancy    ?BPH  . Hypertension   . Multiple myeloma [facility]) 2014    Patient Active Problem List   Diagnosis Date Noted  . 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 Acute blood loss anemia 03/26/2013  . Renal insufficiency 03/26/2013    Past Surgical History:  Procedure Laterality Date  . BRAIN SURGERY  98   tumor  . LAMINECTOMY N/A 03/20/2013   Procedure: THORACIC LAMINECTOMY FOR TUMOR;  Surgeon: [name], MD;  Location: [facility];  Service: Neurosurgery;  Laterality: N/A;  Thoracic Laminectomy for Thoracic Ten Tumor, Pedicle Screws at Thoracic Nine through Eleven  . TRANSURETHRAL RESECTION OF PROSTATE         Home Medications    Prior to Admission medications   Medication Sig Start Date End Date Taking? Authorizing Provider  acetaminophen (TYLENOL) 325 MG tablet Take 1-2 tablets (325-650 mg total) by mouth every 4 (four) hours as needed. Patient  taking differently: Take 325-650 mg by mouth every 4 (four) hours as needed for mild pain.  04/04/13   Love, [name], PA-C  aspirin EC 81 MG tablet Take 81 mg by mouth every morning.    [provider]  bethanechol (URECHOLINE) 25 MG tablet Taper as below: Take one pill four time a day--05/2-05/09/14 Decrease to one pill three times a day from -05/10-05/16/14 Decrease to one pill twice a day from--05/17-05/23 Then take one pill daily till gone. Patient not taking: Reported on 06/10/2017 04/04/13   Love, [name], PA-C  calcium-vitamin D (OSCAL WITH D) 500-200 MG-UNIT tablet Take 1 tablet by mouth 2 (two) times daily.    [provider]  cholecalciferol (VITAMIN D) 1000 units tablet Take 2,000 Units by mouth every morning.    [provider]  donepezil (ARICEPT) 5 MG tablet Take 5 mg by mouth at bedtime.    [provider]  furosemide (LASIX) 20 MG tablet Take 20 mg by mouth at bedtime.    [provider]  iron polysaccharides (NIFEREX) 150 MG capsule Take 1 capsule (150 mg total) by mouth 2 (two) times daily before lunch and supper. For anemia Patient not taking: Reported on 06/10/2017 04/04/13   Love, [name], PA-C  PARoxetine (PAXIL) 20 MG tablet Take 20 mg by mouth every morning.    [provider]  polyethylene glycol (MIRALAX / GLYCOLAX) packet Take 17 g by mouth 2 (  two) times daily. For constipation. Patient not taking: Reported on 06/10/2017 04/04/13   Love, [name], PA-C  psyllium (METAMUCIL) 58.6 % powder Take 1 packet by mouth as needed (fiber).    [provider]  tamsulosin (FLOMAX) 0.4 MG CAPS Take 1 capsule (0.4 mg total) by mouth 2 (two) times daily. Dose increased to help bladder function. Patient taking differently: Take 0.4 mg by mouth daily after breakfast.  04/04/13   Love, [name], PA-C    Family History Family History  Problem Relation Age of Onset  . Diabetes Mother     Social History Social History  Substance Use  Topics  . Smoking status: Never Smoker  . Smokeless tobacco: Never Used  . Alcohol use No     Allergies   Iron polysacch cmplx-b12-fa   Review of Systems Review of Systems  Constitutional: Negative for chills and fever.  HENT: Negative for sore throat.   Eyes: Negative for visual disturbance.  Respiratory: Negative for cough and shortness of breath.   Cardiovascular: Negative for chest pain and palpitations.  Gastrointestinal: Positive for nausea and vomiting. Negative for abdominal pain and blood in stool.  Endocrine: Negative for polyuria.  Genitourinary: Negative for dysuria and flank pain.  Musculoskeletal: Negative for back pain and neck pain.  Skin: Negative for rash.  Neurological: Negative for headaches.  Hematological: Does not bruise/bleed easily.  Psychiatric/Behavioral: Negative for confusion.     Physical Exam Updated Vital Signs There were no vitals taken for this visit.  Physical Exam  Constitutional: He is oriented to person, place, and time. He appears well-developed and well-nourished. No distress.  HENT:  Head: Atraumatic.  Mouth/Throat: Oropharynx is clear and moist.  Eyes: Pupils are equal, round, and reactive to light. Conjunctivae are normal.  Neck: Neck supple. No tracheal deviation present. No thyromegaly present.  No bruits.   Cardiovascular: Normal rate, regular rhythm, normal heart sounds and intact distal pulses.  Exam reveals no gallop and no friction rub.   No murmur heard. Pulmonary/Chest: Effort normal and breath sounds normal. No accessory muscle usage. No respiratory distress.  Abdominal: Soft. Bowel sounds are normal. He exhibits no distension. There is no tenderness.  No pulsatile mass.   Genitourinary:  Genitourinary Comments: No cva tenderness  Musculoskeletal: He exhibits no edema or tenderness.  Neurological: He is alert and oriented to person, place, and time.  Speech clear/fluent. Motor intact bil. Ambulates w steady gait.     Skin: Skin is warm and dry. No rash noted. He is not diaphoretic.  Psychiatric: He has a normal mood and affect.  Nursing note and vitals reviewed.    ED Treatments / Results  Labs (all labs ordered are listed, but only abnormal results are displayed) Labs Reviewed  CBC WITH DIFFERENTIAL/PLATELET  COMPREHENSIVE METABOLIC PANEL  URINALYSIS, ROUTINE W REFLEX MICROSCOPIC  I-STAT TROPOININ, ED    EKG  EKG Interpretation None       Radiology No results found.  Procedures Procedures (including critical care time)  Medications Ordered in ED Medications  sodium chloride 0.9 % bolus 500 mL (not administered)     Initial Impression / Assessment and Plan / ED Course  I have reviewed the triage vital signs and the nursing notes.  Pertinent labs & imaging results that were available during my care of the patient were reviewed by me and considered in my medical decision making (see chart for details).  Iv ns bolus. Po fluids.  Labs sent.  Reviewed nursing notes and  prior charts for additional history.   Recent eval for similar symptoms - 2 recent ED evals, family concerned as these symptoms are new and getting worse.   In ED heart rate as low as 45 - no beta blocker or other rate control meds.  ?possible sick sinus.  Given recurrent near syncope, symptomatic brady, mild aki on labs, will admit to med service.  Monitor, obs, possible cardiology consult.      Final Clinical Impressions(s) / ED Diagnoses   Final diagnoses:  None    New Prescriptions New Prescriptions   No medications on file     Lajean Saver, MD 06/16/17 2041

## 2017-06-17 ENCOUNTER — Observation Stay (HOSPITAL_BASED_OUTPATIENT_CLINIC_OR_DEPARTMENT_OTHER): Payer: Medicare Other

## 2017-06-17 DIAGNOSIS — R9431 Abnormal electrocardiogram [ECG] [EKG]: Secondary | ICD-10-CM

## 2017-06-17 DIAGNOSIS — R42 Dizziness and giddiness: Secondary | ICD-10-CM | POA: Diagnosis not present

## 2017-06-17 DIAGNOSIS — R001 Bradycardia, unspecified: Secondary | ICD-10-CM

## 2017-06-17 DIAGNOSIS — N183 Chronic kidney disease, stage 3 (moderate): Secondary | ICD-10-CM | POA: Diagnosis not present

## 2017-06-17 DIAGNOSIS — N179 Acute kidney failure, unspecified: Secondary | ICD-10-CM | POA: Diagnosis not present

## 2017-06-17 DIAGNOSIS — R55 Syncope and collapse: Secondary | ICD-10-CM | POA: Diagnosis not present

## 2017-06-17 DIAGNOSIS — R112 Nausea with vomiting, unspecified: Secondary | ICD-10-CM

## 2017-06-17 DIAGNOSIS — I129 Hypertensive chronic kidney disease with stage 1 through stage 4 chronic kidney disease, or unspecified chronic kidney disease: Secondary | ICD-10-CM | POA: Diagnosis not present

## 2017-06-17 LAB — CBC
HCT: 32.5 % — ABNORMAL LOW (ref 39.0–52.0)
Hemoglobin: 10.6 g/dL — ABNORMAL LOW (ref 13.0–17.0)
MCH: 31.5 pg (ref 26.0–34.0)
MCHC: 32.6 g/dL (ref 30.0–36.0)
MCV: 96.4 fL (ref 78.0–100.0)
Platelets: 211 10*3/uL (ref 150–400)
RBC: 3.37 MIL/uL — AB (ref 4.22–5.81)
RDW: 16.3 % — ABNORMAL HIGH (ref 11.5–15.5)
WBC: 4.3 10*3/uL (ref 4.0–10.5)

## 2017-06-17 LAB — BASIC METABOLIC PANEL
ANION GAP: 4 — AB (ref 5–15)
BUN: 15 mg/dL (ref 6–20)
CALCIUM: 9 mg/dL (ref 8.9–10.3)
CO2: 26 mmol/L (ref 22–32)
Chloride: 110 mmol/L (ref 101–111)
Creatinine, Ser: 1.45 mg/dL — ABNORMAL HIGH (ref 0.61–1.24)
GFR calc non Af Amer: 45 mL/min — ABNORMAL LOW (ref >60)
GFR, EST AFRICAN AMERICAN: 52 mL/min — AB (ref >60)
Glucose, Bld: 88 mg/dL (ref 65–99)
Potassium: 4 mmol/L (ref 3.5–5.1)
Sodium: 140 mmol/L (ref 135–145)

## 2017-06-17 LAB — ECHOCARDIOGRAM COMPLETE
HEIGHTINCHES: 66 in
WEIGHTICAEL: 3528 [oz_av]

## 2017-06-17 LAB — TROPONIN I: Troponin I: <0.03 ng/mL (ref <0.03)

## 2017-06-17 NOTE — Progress Notes (Signed)
New Admission Note:  Arrival Method: Via stretcher from ED Mental Orientation: Alert & Oriented x4 Telemetry: CCMD verified Assessment: Completed Skin: Refer to flowsheet IV: Left AC Pain: 0/10 Tubes: None Safety Measures: Safety Fall Prevention Plan discussed with patient. Admission: Completed [address] Orientation: Patient has been orientated to the room, unit and the staff. Family: Wife and daughter at the bedside.   Orders have been reviewed and implemented. Will continue to monitor the patient. Call light has been placed within reach.   Vassie Moselle, RN  Phone Number: [phone]

## 2017-06-17 NOTE — Consult Note (Signed)
ELECTROPHYSIOLOGY CONSULT NOTE  Patient ID: Anthony Walls, MRN: [id number], DOB/AGE: 77-Jul-1942 77 y.o. Admit date: 06/16/2017 Date of Consult: 06/17/2017  Primary Physician: [name], MD Primary Cardiologist: new   Chief Complaint: syncope/near syncope   HPI Anthony Walls is a 76 y.o. male  seen at the request of Dr. Jeanette Caprice and [facility] for recurrent presyncope.  These events date back at least to 2017. Cardiology [facility] notes describe an event following a long walk in the heat. ER evaluation had demonstrated PVCs.   An echocardiogram and stress scan were recommended. I cannot find the reports in care everywhere  The patient suffers from some degree of dementia as well as ability to give a history is not great. He and his wife go back and forth during interview. As best as I can tell these episodes are abrupt in onset they're characterized by flushing diaphoresis nausea and vomiting. They're accompanied by orthostatic intolerance and presyncope and then abate.  Most recent episode occurred in church while seated. He became hot and diaphoretic and nauseated. When the church members try to help him stand up he almost lost consciousness. This resolved over minutes when he sat. He denies significant recovery fatigue  As noted one on for at least a couple of years.  He has been noted to have PACs and PVCs. His chart describes bradycardia; this may be related to blocked PACs which is noted on the monitoring  He has no history of palpitations. He has no history of syncope.  He denies chest pain. He does have shortness of breath with exertion but not at rest. He has not had peripheral edema and some time.   He has a history of multiple myeloma for which he has been treated with chemotherapy followed by [facility]  He has prostate cancer. He has had some problems with anemia albeit mild and the 10-11 range.    Past Medical History:  Diagnosis Date  . Arthritis   .  Hesitancy    ?BPH  . Hypertension   . Multiple myeloma ([place]) 2014      Surgical History:  Past Surgical History:  Procedure Laterality Date  . BRAIN SURGERY  89   tumor  . LAMINECTOMY N/A 03/20/2013   Procedure: THORACIC LAMINECTOMY FOR TUMOR;  Surgeon: [name], MD;  Location: [facility];  Service: Neurosurgery;  Laterality: N/A;  Thoracic Laminectomy for Thoracic Ten Tumor, Pedicle Screws at Thoracic Nine through Eleven  . TRANSURETHRAL RESECTION OF PROSTATE       Home Meds: Prior to Admission medications   Medication Sig Start Date End Date Taking? Authorizing Provider  aspirin EC 81 MG tablet Take 81 mg by mouth every morning.   Yes [provider]  calcium-vitamin D (OSCAL WITH D) 500-200 MG-UNIT tablet Take 1 tablet by mouth 2 (two) times daily.   Yes [provider]  cholecalciferol (VITAMIN D) 1000 units tablet Take 2,000 Units by mouth every morning.   Yes [provider]  PARoxetine (PAXIL) 20 MG tablet Take 20 mg by mouth every morning.   Yes [provider]  acetaminophen (TYLENOL) 325 MG tablet Take 1-2 tablets (325-650 mg total) by mouth every 4 (four) hours as needed. Patient taking differently: Take 325-650 mg by mouth every 4 (four) hours as needed for mild pain.  04/04/13   Love, [name], PA-C  bethanechol (URECHOLINE) 25 MG tablet Taper as below: Take one pill four time a day--05/2-05/09/14 Decrease to one  pill three times a day from -05/10-05/16/14 Decrease to one pill twice a day from--05/17-05/23 Then take one pill daily till gone. Patient not taking: Reported on 06/16/2017 04/04/13   Bary Leriche, PA-C  donepezil (ARICEPT) 5 MG tablet Take 5 mg by mouth at bedtime.    [provider]  furosemide (LASIX) 20 MG tablet Take 20 mg by mouth at bedtime.    [provider]  iron polysaccharides (NIFEREX) 150 MG capsule Take 1 capsule (150 mg total) by mouth 2 (two) times daily before lunch and supper. For  anemia Patient not taking: Reported on 06/10/2017 04/04/13   Love, [name], PA-C  polyethylene glycol (MIRALAX / GLYCOLAX) packet Take 17 g by mouth 2 (two) times daily. For constipation. Patient not taking: Reported on 06/10/2017 04/04/13   Love, [name], PA-C  psyllium (METAMUCIL) 58.6 % powder Take 1 packet by mouth as needed (fiber).    [provider]  tamsulosin (FLOMAX) 0.4 MG CAPS Take 1 capsule (0.4 mg total) by mouth 2 (two) times daily. Dose increased to help bladder function. Patient not taking: Reported on 06/16/2017 04/04/13   Bary Leriche, PA-C    Inpatient Medications:  . aspirin EC  81 mg Oral Daily  . enoxaparin (LOVENOX) injection  30 mg Subcutaneous Q24H  . sodium chloride flush  3 mL Intravenous Q12H      Allergies:  Allergies  Allergen Reactions  . Iron Polysacch Cmplx-B12-Fa Hives  . Statins     Social History   Social History  . Marital status: Married    Spouse name: N/A  . Number of children: N/A  . Years of education: N/A   Occupational History  . Not on file.   Social History Main Topics  . Smoking status: Never Smoker  . Smokeless tobacco: Never Used  . Alcohol use No  . Drug use: No  . Sexual activity: Not on file   Other Topics Concern  . Not on file   Social History Narrative  . No narrative on file     Family History  Problem Relation Age of Onset  . Diabetes Mother      ROS:  Please see the history of present illness.     All other systems reviewed and negative.    Physical Exam: Blood pressure 128/66, pulse (!) 59, temperature 98.5 F (36.9 C), temperature source Oral, resp. rate 18, height '5\' 6"'$  (1.676 m), weight 220 lb 8 oz (100 kg), SpO2 99 %. General: Well developed, well nourished male in no acute distress. Head: Normocephalic, atraumatic, sclera non-icteric, no xanthomas, nares are without discharge. EENT: normal Lymph Nodes:  none Back: without scoliosis/kyphosis , no CVA tendersness Neck: Negative for carotid  bruits. JVD not elevated. Lungs: Clear bilaterally to auscultation without wheezes, rales, or rhonchi. Breathing is unlabored. Heart: RRR with S1 S2. 2/6 systolic  murmur , rubs, or gallops appreciated. Abdomen: Soft, non-tender, non-distended with normoactive bowel sounds. No hepatomegaly. No rebound/guarding. No obvious abdominal masses. Msk:  Strength and tone appear normal for age. Extremities: No clubbing or cyanosis. No   edema.  Distal pedal pulses are 2+ and equal bilaterally. Some involuntary tremor left leg Skin: Warm and Dry Neuro: Alert and oriented X 2. CN III-XII intact Grossly normal sensory and motor function . Psych:  Responds to questions appropriately with a normal affect.      Labs: Cardiac Enzymes  Recent Labs  06/16/17 2249 06/17/17 0532 06/17/17 1018  TROPONINI <0.03 <0.03 <0.03  CBC Lab Results  Component Value Date   WBC 4.3 06/17/2017   HGB 10.6 (L) 06/17/2017   HCT 32.5 (L) 06/17/2017   MCV 96.4 06/17/2017   PLT 211 06/17/2017   PROTIME: No results for input(s): LABPROT, INR in the last 72 hours. Chemistry  Recent Labs Lab 06/16/17 1638 06/17/17 0532  NA 141 140  K 4.6 4.0  CL 108 110  CO2 26 26  BUN 17 15  CREATININE 1.86* 1.45*  CALCIUM 9.0 9.0  PROT 6.8  --   BILITOT 0.8  --   ALKPHOS 51  --   ALT 22  --   AST 30  --   GLUCOSE 107* 88   Lipids No results found for: CHOL, HDL, LDLCALC, TRIG BNP No results found for: PROBNP Thyroid Function Tests:  Recent Labs  06/16/17 2249  TSH 0.850      Miscellaneous No results found for: DDIMER  Radiology/Studies:  Dg Chest 2 View  Result Date: 06/10/2017 CLINICAL DATA:  Patient states that while at church he overheated and passed out. Feeling much better. EXAM: CHEST  2 VIEW COMPARISON:  03/19/2013 FINDINGS: Thoracolumbar spine fixation. Lateral view degraded by patient arm position. Moderate thoracic spondylosis. Numerous leads and wires project over the chest. Midline trachea.  Moderate cardiomegaly No pleural effusion or pneumothorax. No congestive failure. IMPRESSION: No acute cardiopulmonary disease. Cardiomegaly without congestive failure. Electronically Signed   By: Abigail Miyamoto M.D.   On: 06/10/2017 15:06    EKG: 7/14 1600  sinus rhythm at 52   Intervals 17/10/52       Assessment and Plan:    Presyncope  Atrial and ventricular ectopy  Multiple myeloma on chemotherapy  Prostate cancer? Status  Dementia     The patient has recurrent episodes of presyncope abrupt in onset. They're associated with a stereo typical prodrome. They have been present over the last couple of years but more frequent of late. The trigger is not clear. I suspect that they are neurally mediated given the epiphenomena to characterize their onset. Some but not all have been associated with meals or having eaten. Interestingly, none has occurred while standing.  On EMS arrival the elevated blood pressure is likely explained as a sympathetic reaction     It is possible that the vomiting is associated with a trigger more directly then as a neurally mediated epiphenomena on. This is I think less likely given the poor association of the spells with eating   Given the amount of ectopy, it is possible that he has an arrhythmic trigger. An event recorder will be useful in trying to identify this as well as the degree of bradycardia associated with it. Given the fact that they occur at rest it is unlikely that they are ischemia mediated i.e. a Bezold-Jarisch reflex   This all presumes that his LV function is normal. In the event that he has LV dysfunction other concerns about.   if an event recorder is not illuminating, an implantable loop recorder might also be appropriate.  We can arrange follow-up through our office in [facility] and will follow with you in the hospital   Virl Axe

## 2017-06-17 NOTE — Progress Notes (Signed)
PT brief evaluation note on 06/17/17  Pt presents with Min guard to supervision for all mobility this session. Pt will not require any further acute PT follow-up at this time as pt is at baseline level of functioning. No post acute PT follow-up required. PT will sign off. Complete evaluation to follow.   Scheryl Marten PT, DPT  [phone]

## 2017-06-17 NOTE — Progress Notes (Addendum)
   Subjective: Anthony Walls is doing well over the interval, states that he would like to go home. Agrees that he will stay so we can ensure these episodes are not related to his heart. Describes the episodes as feeling hot, diaphoresis, and vomiting. Denies chest pain, SOA, or LOC. Discussed the plan to have cardiology come see him and get an echo.   Objective: Vital signs in last 24 hours: Vitals:   06/16/17 2245 06/16/17 2343 06/17/17 0526 06/17/17 0950  BP: (!) 164/80 (!) 140/95 (!) 159/66 128/66  Pulse: (!) 48 (!) 57 65 (!) 59  Resp: 16 18 18 18   Temp:  99.1 F (37.3 C) 98 F (36.7 C) 98.5 F (36.9 C)  TempSrc:  Oral Oral Oral  SpO2: 98% 99% 100% 99%  Weight:  220 lb 8 oz (100 kg)    Height:  5\' 6"  (1.676 m)     Physical Exam  Constitutional: He is oriented to person, place, and time. He appears well-developed and well-nourished. No distress.  HENT:  Head: Normocephalic and atraumatic.  Eyes: Pupils are equal, round, and reactive to light. Conjunctivae are normal.  Neck: Neck supple. No thyromegaly present.  Cardiovascular: Intact distal pulses.   Bradycardia with irregular pulse.   Pulmonary/Chest: Effort normal and breath sounds normal. He has no wheezes. He has no rales.  Abdominal: Soft. Bowel sounds are normal. There is no tenderness. There is no guarding.  Musculoskeletal: He exhibits no edema or tenderness.  Neurological: He is alert and oriented to person, place, and time.   Assessment/Plan:  Principal Problem:   Nausea with vomiting Active Problems:   Bradycardia  1. Diaphoresis and Vomiting  - Patient's account of the episodes are not consistent with syncope.  - Bradycardia, diaphoresis, and vomiting are possible with inferior MI; however, ECG does not show ischemic changes and troponin x 2 are negative.  - Holter monitor as outpatient illustrated multiple PACs and PVCs  - Stress test in 12/2016 was unremarkable  - Telemetry overnight illustrated possible  paroxysmal A-Fib (hard to determine due to low voltage) - Unlikely to be related to renal disease (uremic symptoms) or metabolic derangements including electrolyte and TSH - Discussed plan with cardiology, recommending continued outpatient follow-up with outpatient cardiology (Dr. Dorris Carnes) - Will get Echo today  Dispo: Anticipated discharge in approximately 1-2 day(s).   Ina Homes, MD 06/17/2017, 10:15 AM My Pager: [phone]

## 2017-06-17 NOTE — Evaluation (Signed)
Physical Therapy Evaluation Patient Details Name: Anthony Walls MRN: [id number] DOB: [birth year] Today's Date: 06/17/2017   History of Present Illness  Pt is a 76 yo male admitted through the ED on 06/16/17 following a near syncopal episode with vomiting and diaphoresis. Pt had two other episodes on 7/8 and 7/11 where he was seen in ED and work-up was unremarkable. PMH significant for multiple myeloma, HTN, prostate CA, brain tumor as child.   Clinical Impression  Pt presents with the above diagnosis for therapy evaluation. Prior to admission, pt was living with his wife who does work full-time. Pt assisted his wife in getting ready for work and performing light housekeeping and meal prep when she was away. Pt is able to perform all mobility this session with Mod I and was encouraged to use his Bowler or RW throughout home and when leaving the home in order to improve mobility. Pt has had no falls in the past 12 months and no near falls. Pt is advised to contact MD for PT follow-up if any changes in function occur in the future. No further skilled PT required at this time. PT will sign off.    Follow Up Recommendations No PT follow up    Equipment Recommendations  None recommended by PT    Recommendations for Other Services       Precautions / Restrictions Precautions Precautions: None Restrictions Weight Bearing Restrictions: No      Mobility  Bed Mobility Overal bed mobility: Independent                Transfers Overall transfer level: Independent                  Ambulation/Gait Ambulation/Gait assistance: Modified independent (Device/Increase time) Ambulation Distance (Feet): 250 Feet Assistive device: None Gait Pattern/deviations: Step-through pattern Gait velocity: decreased Gait velocity interpretation: at or above normal speed for age/gender General Gait Details: pt with good sequencing, minimal cross over stepping with good self-recovery. Normally uses a  cane at home.   Stairs            Wheelchair Mobility    Modified Rankin (Stroke Patients Only)       Balance Overall balance assessment: Modified Independent                                           Pertinent Vitals/Pain Pain Assessment: No/denies pain    Home Living Family/patient expects to be discharged to:: Private residence Living Arrangements: Spouse/significant other Available Help at Discharge: Family;Available PRN/intermittently Type of Home: Other(Comment) (condo) Home Access: Level entry     Home Layout: One level Home Equipment: Walker - 2 wheels;Cane - single point      Prior Function Level of Independence: Independent         Comments: completely independent, doing for himself. Reports he doesn't drive.      Hand Dominance   Dominant Hand: Right    Extremity/Trunk Assessment   Upper Extremity Assessment Upper Extremity Assessment: Overall WFL for tasks assessed    Lower Extremity Assessment Lower Extremity Assessment: Overall WFL for tasks assessed    Cervical / Trunk Assessment Cervical / Trunk Assessment: Normal  Communication   Communication: No difficulties  Cognition Arousal/Alertness: Awake/alert Behavior During Therapy: WFL for tasks assessed/performed Overall Cognitive Status: Within Functional Limits for tasks assessed  General Comments      Exercises     Assessment/Plan    PT Assessment Patent does not need any further PT services  PT Problem List         PT Treatment Interventions      PT Goals (Current goals can be found in the Care Plan section)  Acute Rehab PT Goals Patient Stated Goal: to get home today PT Goal Formulation: All assessment and education complete, DC therapy    Frequency     Barriers to discharge        Co-evaluation               AM-PAC PT "6 Clicks" Daily Activity  Outcome Measure Difficulty  turning over in bed (including adjusting bedclothes, sheets and blankets)?: None Difficulty moving from lying on back to sitting on the side of the bed? : None Difficulty sitting down on and standing up from a chair with arms (e.g., wheelchair, bedside commode, etc,.)?: None Help needed moving to and from a bed to chair (including a wheelchair)?: None Help needed walking in hospital room?: None Help needed climbing 3-5 steps with a railing? : None 6 Click Score: 24    End of Session Equipment Utilized During Treatment: Gait belt Activity Tolerance: Patient tolerated treatment well Patient left: in bed;with call bell/phone within reach;with family/visitor present Nurse Communication: Mobility status PT Visit Diagnosis: Unsteadiness on feet (R26.81)    Time: 2820-6015 PT Time Calculation (min) (ACUTE ONLY): 20 min   Charges:   PT Evaluation $PT Eval Moderate Complexity: 1 Procedure     PT G Codes:   PT G-Codes **NOT FOR INPATIENT CLASS** Functional Assessment Tool Used: AM-PAC 6 Clicks Basic Mobility;Clinical judgement Functional Limitation: Mobility: Walking and moving around Mobility: Walking and Moving Around Current Status (I1537): 0 percent impaired, limited or restricted Mobility: Walking and Moving Around Goal Status (H4327): 0 percent impaired, limited or restricted Mobility: Walking and Moving Around Discharge Status (M1470): 0 percent impaired, limited or restricted    Scheryl Marten PT, DPT  [phone]   Shanon Rosser 06/17/2017, 2:21 PM

## 2017-06-17 NOTE — Progress Notes (Signed)
  Echocardiogram 2D Echocardiogram has been performed.  Wilhelmina Hark T Jan Olano 06/17/2017, 3:38 PM

## 2017-06-18 ENCOUNTER — Other Ambulatory Visit: Payer: Self-pay | Admitting: Cardiology

## 2017-06-18 ENCOUNTER — Ambulatory Visit (INDEPENDENT_AMBULATORY_CARE_PROVIDER_SITE_OTHER): Payer: Medicare Other

## 2017-06-18 DIAGNOSIS — R55 Syncope and collapse: Secondary | ICD-10-CM | POA: Diagnosis not present

## 2017-06-18 DIAGNOSIS — R42 Dizziness and giddiness: Secondary | ICD-10-CM | POA: Diagnosis not present

## 2017-06-18 DIAGNOSIS — Z888 Allergy status to other drugs, medicaments and biological substances status: Secondary | ICD-10-CM | POA: Diagnosis not present

## 2017-06-18 DIAGNOSIS — R001 Bradycardia, unspecified: Secondary | ICD-10-CM

## 2017-06-18 DIAGNOSIS — R112 Nausea with vomiting, unspecified: Secondary | ICD-10-CM | POA: Diagnosis not present

## 2017-06-18 MED ORDER — ENOXAPARIN SODIUM 40 MG/0.4ML ~~LOC~~ SOLN
40.0000 mg | SUBCUTANEOUS | Status: DC
  Administered 2017-06-18: 40 mg via SUBCUTANEOUS
  Filled 2017-06-18: qty 0.4

## 2017-06-18 NOTE — Progress Notes (Signed)
Internal Medicine Attending:   I saw and examined the patient. I reviewed the resident's note and I agree with the resident's findings and plan as documented in the resident's note.  Patient feels well today with no new complaints. He was initially admitted for near-syncope, diaphoresis and vomiting. His workup thus far has been negative. It is likely that his symptoms are neurocardiogenic in nature. However, patient was seen by cardiology here and is being set up for an outpatient event monitor and will also likely receive an outpatient stress test. 2-D echo results noted. No arrhythmias noted on telemetry monitor. Cardiology follow-up appreciated. No further inpatient workup for now. Patient is stable for discharge home today. He will need close follow-up with PCP as well as cardiology. Plan discussed with patient and wife at bedside in detail and they are in agreement with plan.

## 2017-06-18 NOTE — Care Management Obs Status (Signed)
[facility] NOTIFICATION   Patient Details  Name: Anthony Walls MRN: [id number] Date of Birth: 11-01-1941   Medicare Observation Status Notification Given:  Yes    Idella Lamontagne, Rory Percy, RN 06/18/2017, 12:00 PM

## 2017-06-18 NOTE — Plan of Care (Signed)
Problem: Safety: Goal: Ability to remain free from injury will improve Outcome: Progressing Importance of using call bell when needing assistance stressed to patient.  Patient verbalized understanding and demonstrated correct use of call bell.   Patient progressing towards goal.

## 2017-06-18 NOTE — Progress Notes (Signed)
   Subjective: Anthony Walls rested comfortably overnight with no recurrent events. Denies chest pain, soa, N/V, fever, chills, dizziness with standing. Discussed the plan for discharge today with follow-up for event monitor placement and follow-up with Dr. Caryl Comes. Agreeable to the plan. Wife at bedside, agreeable to plan. All questions answered.   Objective: Vital signs in last 24 hours: Vitals:   06/17/17 0526 06/17/17 0950 06/17/17 2040 06/18/17 0433  BP: (!) 159/66 128/66 (!) 154/78 135/72  Pulse: 65 (!) 59 (!) 56 (!) 40  Resp: 18 18 18 18   Temp: 98 F (36.7 C) 98.5 F (36.9 C) 99 F (37.2 C) 98.4 F (36.9 C)  TempSrc: Oral Oral Oral Oral  SpO2: 100% 99% 100% 97%  Weight:   220 lb (99.8 kg)   Height:       Physical Exam  Constitutional: He is oriented to person, place, and time. He appears well-developed and well-nourished.  HENT:  Head: Normocephalic and atraumatic.  Eyes: Pupils are equal, round, and reactive to light. Conjunctivae are normal.  Cardiovascular: Regular rhythm, normal heart sounds and intact distal pulses.  Exam reveals no friction rub.   No murmur heard. Bradycardia  Pulmonary/Chest: Effort normal and breath sounds normal. He has no wheezes. He has no rales.  Abdominal: Soft. Bowel sounds are normal. There is no tenderness. There is no guarding.  Musculoskeletal: He exhibits no edema.  Neurological: He is alert and oriented to person, place, and time.   Assessment/Plan:  Principal Problem:   Nausea with vomiting Active Problems:   Bradycardia   Lightheadedness  1. Diaphoresis and Vomiting  - Troponin x 3 negative  - Seen by Dr. Caryl Comes yesterday, thinks this is neuro-mediated but recommending event monitor as an outpatient with follow-up at his clinic.  - Echo illustrate EF of 55-60% with LVH, mild LAE, mild stenosis of the aortic valve, mild tricuspid regurgitation, and mild pulmonary HTN (36 mm Hg)  - Message sent to Cards Master for outpatient event  monitor placement  - Tele overnight showed no tachycardia, only sinus bradycardia - Discharge today   Dispo: Anticipated discharge in approximately 0 day(s).   Ina Homes, MD 06/18/2017, 8:07 AM Pager: My Pager: [phone]

## 2017-06-18 NOTE — Progress Notes (Signed)
Progress Note  Patient Name: Anthony Walls Date of Encounter: 06/18/2017  Primary Cardiologist:   New Dr. Caryl Comes  Subjective   No further presyncope.  No chest pain.     Inpatient Medications    Scheduled Meds: . aspirin EC  81 mg Oral Daily  . enoxaparin (LOVENOX) injection  40 mg Subcutaneous Q24H  . sodium chloride flush  3 mL Intravenous Q12H   Continuous Infusions:  PRN Meds: acetaminophen **OR** acetaminophen   Vital Signs    Vitals:   06/17/17 0950 06/17/17 2040 06/18/17 0433 06/18/17 1041  BP: 128/66 (!) 154/78 135/72 127/77  Pulse: (!) 59 (!) 56 (!) 40 (!) 119  Resp: 18 18 18 17   Temp: 98.5 F (36.9 C) 99 F (37.2 C) 98.4 F (36.9 C) 98.7 F (37.1 C)  TempSrc: Oral Oral Oral Oral  SpO2: 99% 100% 97% 95%  Weight:  220 lb (99.8 kg)    Height:        Intake/Output Summary (Last 24 hours) at 06/18/17 1056 Last data filed at 06/18/17 0900  Gross per 24 hour  Intake              600 ml  Output                0 ml  Net              600 ml   Filed Weights   06/16/17 2343 06/17/17 2040  Weight: 220 lb 8 oz (100 kg) 220 lb (99.8 kg)    Telemetry    NSR, PVCs - Personally Reviewed  ECG    NA - Personally Reviewed  Physical Exam   GEN: No acute distress.   Neck: No  JVD Cardiac: RRR, no murmurs, rubs, or gallops.  Respiratory: Clear  to auscultation bilaterally. GI: Soft, nontender, non-distended  MS: No  edema; No deformity. Neuro:  Nonfocal  Psych: Normal affect   Labs    Chemistry Recent Labs Lab 06/12/17 2102 06/16/17 1638 06/17/17 0532  NA 140 141 140  K 3.9 4.6 4.0  CL 106 108 110  CO2 27 26 26   GLUCOSE 130* 107* 88  BUN 24* 17 15  CREATININE 1.72* 1.86* 1.45*  CALCIUM 8.6* 9.0 9.0  PROT  --  6.8  --   ALBUMIN  --  3.5  --   AST  --  30  --   ALT  --  22  --   ALKPHOS  --  51  --   BILITOT  --  0.8  --   GFRNONAA 37* 34* 45*  GFRAA 43* 39* 52*  ANIONGAP 7 7 4*     Hematology Recent Labs Lab 06/12/17 2102  06/16/17 1638 06/17/17 0532  WBC 3.2* 3.7* 4.3  RBC 3.46* 3.62* 3.37*  HGB 10.8* 11.2* 10.6*  HCT 34.1* 35.2* 32.5*  MCV 98.6 97.2 96.4  MCH 31.2 30.9 31.5  MCHC 31.7 31.8 32.6  RDW 16.0* 15.8* 16.3*  PLT 198 216 211    Cardiac Enzymes Recent Labs Lab 06/16/17 2249 06/17/17 0532 06/17/17 1018  TROPONINI <0.03 <0.03 <0.03    Recent Labs Lab 06/12/17 2122 06/16/17 1647  TROPIPOC 0.01 0.00     BNPNo results for input(s): BNP, PROBNP in the last 168 hours.   DDimer No results for input(s): DDIMER in the last 168 hours.   Radiology    No results found.  Cardiac Studies   06/17/17  Study Conclusions  - Left ventricle: The  cavity size was normal. There was mild   concentric hypertrophy. Systolic function was normal. The   estimated ejection fraction was in the range of 55% to 60%. Wall   motion was normal; there were no regional wall motion   abnormalities. Left ventricular diastolic function parameters   were normal. - Aortic valve: Trileaflet; mildly thickened, mildly calcified   leaflets. Transvalvular velocity was minimally increased. 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 Peak velocity (S): 222 cm/s. Valve area   (VTI): 2.02 cm^2. Valve area (Vmax): 2.07 cm^2. - Left atrium: The atrium was mildly dilated. - Tricuspid valve: There was mild regurgitation. - Pulmonary arteries: Systolic pressure was mildly increased. PA   peak pressure: 36 mm Hg (S).   Patient Profile     76 y.o. male with presyncope and PVCs  Assessment & Plan    PRESYNCOPE:   OK to discharge.  Plan is for out patient event monitor.  I have spoken with my staff to see if we could get that today in [facility].  I am also trying to change the follow up which has been scheduled to [facility].  He has had no further episodes since admission and his tele is without significant arrhythmias.  Echo is as above.   Signed, Minus Breeding, MD  06/18/2017, 10:56 AM

## 2017-06-18 NOTE — Care Management Note (Signed)
Case Management Note  Patient Details  Name: Anthony Walls MRN: [id number] Date of Birth: 08/25/1941  Subjective/Objective:     CM following for progression and d/c planning.                Action/Plan: 06/18/2017 Met with pt and wife. Pt independent at home, moving about room and up in chair at time of this visit. Pt understands OBS status, this was explained to pt wife, per her request.  Following for recommendations at this time.   Expected Discharge Date:  06/18/17               Expected Discharge Plan:  Home/Self Care  In-House Referral:  NA  Discharge planning Services  CM Consult  Post Acute Care Choice:    Choice offered to:     DME Arranged:    DME Agency:     HH Arranged:    HH Agency:     Status of Service:  In process, will continue to follow  If discussed at Long Length of Stay Meetings, dates discussed:    Additional Comments:  ,  U, RN 06/18/2017, 12:01 PM  

## 2017-06-18 NOTE — Progress Notes (Signed)
IV discontinued,catheter intact. Discharge instructions given on medications,and follow up visits,patient,and family verbalized understanding. No c/o pain or discomfort noted. Accompanied by staff to an awaiting vehicle.

## 2017-06-18 NOTE — Discharge Summary (Signed)
Name: Anthony Walls MRN: [id number] DOB: 1941/06/04 76 y.o. PCP: [name], MD  Date of Admission: 06/16/2017  3:53 PM Date of Discharge: 06/18/2017 Attending Physician: Bartholomew Crews, MD  Discharge Diagnosis: 1. Nausea and Vomiting  2. Bradycardia   Principal Problem:   Nausea with vomiting Active Problems:   Bradycardia   Lightheadedness  Discharge Medications: Allergies as of 06/18/2017      Reactions   Iron Polysacch [id number] Hives   Statins       Medication List    TAKE these medications   acetaminophen 325 MG tablet Commonly known as:  TYLENOL Take 1-2 tablets (325-650 mg total) by mouth every 4 (four) hours as needed. What changed:  reasons to take this   aspirin EC 81 MG tablet Take 81 mg by mouth every morning.   bethanechol 25 MG tablet Commonly known as:  URECHOLINE Taper as below: Take one pill four time a day--05/2-05/09/14 Decrease to one pill three times a day from -05/10-05/16/14 Decrease to one pill twice a day from--05/17-05/23 Then take one pill daily till gone.   calcium-vitamin D 500-200 MG-UNIT tablet Commonly known as:  OSCAL WITH D Take 1 tablet by mouth 2 (two) times daily.   cholecalciferol 1000 units tablet Commonly known as:  VITAMIN D Take 2,000 Units by mouth every morning.   donepezil 5 MG tablet Commonly known as:  ARICEPT Take 5 mg by mouth at bedtime.   furosemide 20 MG tablet Commonly known as:  LASIX Take 20 mg by mouth at bedtime.   iron polysaccharides 150 MG capsule Commonly known as:  NIFEREX Take 1 capsule (150 mg total) by mouth 2 (two) times daily before lunch and supper. For anemia   PARoxetine 20 MG tablet Commonly known as:  PAXIL Take 20 mg by mouth every morning.   polyethylene glycol packet Commonly known as:  MIRALAX / GLYCOLAX Take 17 g by mouth 2 (two) times daily. For constipation.   psyllium 58.6 % powder Commonly known as:  METAMUCIL Take 1 packet by mouth as needed  (fiber).   tamsulosin 0.4 MG Caps capsule Commonly known as:  FLOMAX Take 1 capsule (0.4 mg total) by mouth 2 (two) times daily. Dose increased to help bladder function.      Disposition and follow-up:   Mr.Anthony Walls was discharged from [facility] in Good condition.  At the hospital follow up visit please address:  1.   Did you follow-up to get your event monitor? Any recurrent symptoms?   2.  Labs / imaging needed at time of follow-up: Event monitor, stress testing.   3.  Pending labs/ test needing follow-up: N/A  Follow-up Appointments: Follow-up Information    [name], MD. Go on 06/28/2017.   Specialty:  Family Medicine Why:  @ 1:40 PM Contact information: [place] [address] [phone]        Deboraha Sprang, MD. Go on 07/06/2017.   Specialty:  Cardiology Why:  @ 10:20AM Contact information: [address] [address] [phone]          Hospital Course by problem list: Principal Problem:   Nausea with vomiting Active Problems:   Bradycardia   Lightheadedness   1. Nausea and Vomiting. Mr. Anthony Walls presented to the ED on 06/16/2017 for recurrent episodes of diaphoresis, a warmth sensation, and vomiting. He has been seen in the ED 2 other times in the past month  with similar symptoms and seem to date back to 2017. Thorough work-up including orthostatics, ECG, and Troponin x 3 were unremarkable. Telemetry illustrated frequent bradycardia with runs of tachycardia. Due to low voltage it is hard to assess if this is A-Fib. He was bradycardic on admission with a HR in the 40s. Chart review illustrated that he had a Holter monitor in the past showing multiple PACs and PVCs. Stress test was recommended in the past but no documented results could be found in his chart. Electrophysiology was consulted, thinking this could be of cardiac etiology. They think it is likely  neuro-mediated but recommend an event monitor with outpatient follow-up with Dr. Caryl Comes. We recommend outpatient stress testing.  2. Bradycardia. Mr. Anthony Walls presented to the ED with a HR in the 40s. Throughout his hospitalization it continued to run in the 40-50s. He was placed on telemetry that illustrated frequent bradycardia with runs of tachycardia. Due to low voltage it is hard to assess if this is A-Fib. He is not on any medications that could cause nodal block. ECG did not show any signs heart block. We do not think his bradycardia is contributing to his current symptoms. Follow-up with outpatient cardiology.   Discharge Vitals:   BP 127/77 (BP Location: Right Arm)   Pulse (!) 119   Temp 98.7 F (37.1 C) (Oral)   Resp 17   Ht 5\' 6"  (1.676 m)   Wt 220 lb (99.8 kg)   SpO2 95%   BMI 35.51 kg/m   Pertinent Labs, Studies, and Procedures:  N/A  Discharge Instructions: Discharge Instructions    Diet - low sodium heart healthy    Complete by:  As directed    Increase activity slowly    Complete by:  As directed      Signed: Ina Homes, MD 06/18/2017, 11:10 AM   My Pager: [phone]

## 2017-06-18 NOTE — Discharge Instructions (Signed)
Please follow-up with both Dr. Lenna Gilford and Dr. Caryl Comes.   Please follow-up to get an event monitor.   Syncope Syncope is when you lose temporarily pass out (faint). Signs that you may be about to pass out include:  Feeling dizzy or light-headed.  Feeling sick to your stomach (nauseous).  Seeing all white or all black.  Having cold, clammy skin.  If you passed out, get help right away. Call your local emergency services (911 in the [place] Do not drive yourself to the hospital. Follow these instructions at home: Pay attention to any changes in your symptoms. Take these actions to help with your condition:  Have someone stay with you until you feel stable.  Do not drive, use machinery, or play sports until your doctor says it is okay.  Keep all follow-up visits as told by your doctor. This is important.  If you start to feel like you might pass out, lie down right away and raise (elevate) your feet above the level of your heart. Breathe deeply and steadily. Wait until all of the symptoms are gone.  Drink enough fluid to keep your pee (urine) clear or pale yellow.  If you are taking blood pressure or heart medicine, get up slowly and spend many minutes getting ready to sit and then stand. This can help with dizziness.  Take over-the-counter and prescription medicines only as told by your doctor.  Get help right away if:  You have a very bad headache.  You have unusual pain in your chest, tummy, or back.  You are bleeding from your mouth or rectum.  You have black or tarry poop (stool).  You have a very fast or uneven heartbeat (palpitations).  It hurts to breathe.  You pass out once or more than once.  You have jerky movements that you cannot control (seizure).  You are confused.  You have trouble walking.  You are very weak.  You have vision problems. These symptoms may be an emergency. Do not wait to see if the symptoms will go away. Get medical help right away.  Call your local emergency services (911 in the [place] Do not drive yourself to the hospital. This information is not intended to replace advice given to you by your health care provider. Make sure you discuss any questions you have with your health care provider. Document Released: 05/08/2008 Document Revised: 04/27/2016 Document Reviewed: 08/04/2015 Elsevier Interactive Patient Education  2018 [employer].   Bradycardia, Adult Bradycardia is a slower-than-normal heartbeat. A normal resting heart rate for an adult ranges from 60 to 100 beats per minute. With bradycardia, the resting heart rate is less than 60 beats per minute. Bradycardia can prevent enough oxygen from reaching certain areas of your body when you are active. It can be serious if it keeps enough oxygen from reaching your brain and other parts of your body. Bradycardia is not a problem for everyone. For some healthy adults, a slow resting heart rate is normal. What are the causes? This condition may be caused by:  A problem with the heart, including: ? A problem with the heart's electrical system, such as a heart block. ? A problem with the heart's natural pacemaker (sinus node). ? Heart disease. ? A heart attack. ? Heart damage. ? A heart infection. ? A heart condition that is present at birth (congenital heart defect).  Certain medicines that treat heart conditions.  Certain conditions, such as hypothyroidism and obstructive sleep apnea.  Problems with the balance of  chemicals and other substances, like potassium, in the blood.  What increases the risk? This condition is more likely to develop in adults who:  Are age 72 or older.  Have high blood pressure (hypertension), high cholesterol (hyperlipidemia), or diabetes.  Drink heavily, use tobacco or nicotine products, or use drugs.  Are stressed.  What are the signs or symptoms? Symptoms of this condition include:  Light-headedness.  Feeling faint or  fainting.  Fatigue and weakness.  Shortness of breath.  Chest pain (angina).  Drowsiness.  Confusion.  Dizziness.  How is this diagnosed? This condition may be diagnosed based on:  Your symptoms.  Your medical history.  A physical exam.  During the exam, your health care provider will listen to your heartbeat and check your pulse. To confirm the diagnosis, your health care provider may order tests, such as:  Blood tests.  An electrocardiogram (ECG). This test records the heart's electrical activity. The test can show how fast your heart is beating and whether the heartbeat is steady.  A test in which you wear a portable device (event recorder or Holter monitor) to record your heart's electrical activity while you go about your day.  Anexercise test.  How is this treated? Treatment for this condition depends on the cause of the condition and how severe your symptoms are. Treatment may involve:  Treatment of the underlying condition.  Changing your medicines or how much medicine you take.  Having a small, battery-operated device called a pacemaker implanted under the skin. When bradycardia occurs, this device can be used to increase your heart rate and help your heart to beat in a regular rhythm.  Follow these instructions at home: Lifestyle   Manage any health conditions that contribute to bradycardia as told by your health care provider.  Follow a heart-healthy diet. A nutrition specialist (dietitian) can help to educate you about healthy food options and changes.  Follow an exercise program that is approved by your health care provider.  Maintain a healthy weight.  Try to reduce or manage your stress, such as with yoga or meditation. If you need help reducing stress, ask your health care provider.  Do not use use any products that contain nicotine or tobacco, such as cigarettes and e-cigarettes. If you need help quitting, ask your health care provider.  Do  not use illegal drugs.  Limit alcohol intake to no more than 1 drink per day for nonpregnant women and 2 drinks per day for men. One drink equals 12 oz of beer, 5 oz of wine, or 1 oz of hard liquor. General instructions  Take over-the-counter and prescription medicines only as told by your health care provider.  Keep all follow-up visits as directed by your health care provider. This is important. How is this prevented? In some cases, bradycardia may be prevented by:  Treating underlying medical problems.  Stopping behaviors or medicines that can trigger the condition.  Contact a health care provider if:  You feel light-headed or dizzy.  You almost faint.  You feel weak or are easily fatigued during physical activity.  You experience confusion or have memory problems. Get help right away if:  You faint.  You have an irregular heartbeat (palpitations).  You have chest pain.  You have trouble breathing. This information is not intended to replace advice given to you by your health care provider. Make sure you discuss any questions you have with your health care provider. Document Released: 08/12/2002 Document Revised: 07/18/2016 Document Reviewed: 05/11/2016  Chartered certified accountant Patient Education  [employer].

## 2017-07-06 ENCOUNTER — Ambulatory Visit: Payer: Medicare Other | Admitting: Nurse Practitioner

## 2017-07-25 ENCOUNTER — Ambulatory Visit (INDEPENDENT_AMBULATORY_CARE_PROVIDER_SITE_OTHER): Payer: Medicare Other | Admitting: Cardiology

## 2017-07-25 ENCOUNTER — Ambulatory Visit: Payer: Medicare Other | Admitting: Cardiology

## 2017-07-25 ENCOUNTER — Other Ambulatory Visit: Payer: Self-pay | Admitting: Cardiology

## 2017-07-25 ENCOUNTER — Encounter: Payer: Self-pay | Admitting: Cardiology

## 2017-07-25 VITALS — BP 134/72 | HR 67 | Ht 65.5 in | Wt 221.0 lb

## 2017-07-25 DIAGNOSIS — R55 Syncope and collapse: Secondary | ICD-10-CM | POA: Diagnosis not present

## 2017-07-25 DIAGNOSIS — R001 Bradycardia, unspecified: Secondary | ICD-10-CM | POA: Diagnosis not present

## 2017-07-25 NOTE — Patient Instructions (Addendum)
Medication Instructions:  Your physician recommends that you continue on your current medications as directed. Please refer to the Current Medication list given to you today.  If you need a refill on your cardiac medications before your next appointment, please call your pharmacy.   Labwork: None ordered  Testing/Procedures: Your physician has recommended you have loop recorder implanted.    You are scheduled for 08/02/2017    Please arrive at 10:00 a.m. the [facility] Entrance "A" of [place] may take your medications the morning of your procedure.    You will need someone to drive you home afterwards.  Follow-Up: Your physician recommends that you schedule a follow-up appointment in: 7-10 days, after your procedure on 08/02/2017, with the device clinic for a wound check.  This appointment will be at [address], [address] in [place] recommends that you schedule a follow-up appointment in: 3 months in [place], after your procedure on 08/02/2017, with Dr. Curt Bears.  Thank you for choosing [facility]!!   Trinidad Curet, RN [phone]  Any Other Special Instructions Will Be Listed Below (If Applicable).

## 2017-07-25 NOTE — Progress Notes (Signed)
Electrophysiology Office Note   Date:  07/25/2017   ID:  Anthony Walls, DOB [birth year], MRN [id number]  PCP:  [name], MD  Cardiologist:  [place] Primary Electrophysiologist:  Beryl Balz Meredith Leeds, MD    Chief Complaint  Patient presents with  . Follow-up    Bradycardia/monitor results     History of Present Illness: Anthony Walls is a 76 y.o. male who is being seen today for the evaluation of syncope at the request of [name], MD. Presenting today for electrophysiology evaluation. He has a history of multiple myeloma status post chemotherapy, prostate cancer, hypertension, anemia, and potential dementia.   Presented to the hospital in July with an episode of syncope. He had an episode in 2017. [facility] cardiology notes describe a long walk and heat. Urine evaluation of the time showed PVCs. Echocardiogram and stress were recommended, though not performed.  Episodes are abrupt in onset characterized by flushing diaphoresis, nausea, and vomiting. A, he by orthostatic intolerance and presyncope. They abate quickly. The most recent occurred while at church. He became hot and diaphoretic as well as nauseous. When church members tried to help him up he almost lost consciousness. This resolved after a few minutes when he sat down.  Today, he denies symptoms of palpitations, chest pain, shortness of breath, orthopnea, PND, lower extremity edema, claudication, dizziness, presyncope, syncope, bleeding, or neurologic sequela. The patient is tolerating medications without difficulties.    Past Medical History:  Diagnosis Date  . Arthritis   . Hesitancy    ?BPH  . Hypertension   . Multiple myeloma ([place]) 2014   Past Surgical History:  Procedure Laterality Date  . BRAIN SURGERY  98   tumor  . LAMINECTOMY N/A 03/20/2013   Procedure: THORACIC LAMINECTOMY FOR TUMOR;  Surgeon: [name], MD;  Location: [place] [facility];  Service: Neurosurgery;  Laterality: N/A;  Thoracic  Laminectomy for Thoracic Ten Tumor, Pedicle Screws at Thoracic Nine through Eleven  . TRANSURETHRAL RESECTION OF PROSTATE       Current Outpatient Prescriptions  Medication Sig Dispense Refill  . acetaminophen (TYLENOL) 325 MG tablet Take 1-2 tablets (325-650 mg total) by mouth every 4 (four) hours as needed. (Patient taking differently: Take 325-650 mg by mouth every 4 (four) hours as needed for mild pain. )    . aspirin EC 81 MG tablet Take 81 mg by mouth every morning.    . calcium-vitamin D (OSCAL WITH D) 500-200 MG-UNIT tablet Take 1 tablet by mouth 2 (two) times daily.    . cholecalciferol (VITAMIN D) 1000 units tablet Take 2,000 Units by mouth every morning.    Marland Kitchen lenalidomide (REVLIMID) 5 MG capsule Take 5 mg by mouth daily.    Marland Kitchen PARoxetine (PAXIL) 20 MG tablet Take 20 mg by mouth every morning.    . polyethylene glycol (MIRALAX / GLYCOLAX) packet Take 17 g by mouth 2 (two) times daily. For constipation. 14 each   . psyllium (METAMUCIL) 58.6 % powder Take 1 packet by mouth as needed (fiber).     No current facility-administered medications for this visit.     Allergies:   Iron polysacch cmplx-b12-fa and Statins   Social History:  The patient  reports that he has never smoked. He has never used smokeless tobacco. He reports that he does not drink alcohol or use drugs.   Family History:  The patient's family history includes Diabetes in his mother.    ROS:  Please see the history of present illness.  Otherwise, review of systems is positive for none.   All other systems are reviewed and negative.    PHYSICAL EXAM: VS:  BP 134/72   Pulse 67   Ht 5' 5.5" (1.664 m)   Wt 221 lb (100.2 kg)   BMI 36.22 kg/m  , BMI Body mass index is 36.22 kg/m. GEN: Well nourished, well developed, in no acute distress  HEENT: normal  Neck: no JVD, carotid bruits, or masses Cardiac: RRR; no murmurs, rubs, or gallops,no edema  Respiratory:  clear to auscultation bilaterally, normal work of  breathing GI: soft, nontender, nondistended, + BS MS: no deformity or atrophy  Skin: warm and dry Neuro:  Strength and sensation are intact Psych: euthymic mood, full affect  EKG:  EKG is not ordered today. Personal review of the ekg ordered 06/17/17 shows SR, PACs, LVH  Recent Labs: 06/16/2017: ALT 22; Magnesium 1.8; TSH 0.850 06/17/2017: BUN 15; Creatinine, Ser 1.45; Hemoglobin 10.6; Platelets 211; Potassium 4.0; Sodium 140    Lipid Panel  No results found for: CHOL, TRIG, HDL, CHOLHDL, VLDL, LDLCALC, LDLDIRECT   Wt Readings from Last 3 Encounters:  07/25/17 221 lb (100.2 kg)  06/17/17 220 lb (99.8 kg)  06/12/17 218 lb (98.9 kg)      Other studies Reviewed: Additional studies/ records that were reviewed today include: TTE 06/17/17  Review of the above records today demonstrates:  - Left ventricle: The cavity size was normal. There was mild   concentric hypertrophy. Systolic function was normal. The   estimated ejection fraction was in the range of 55% to 60%. Wall   motion was normal; there were no regional wall motion   abnormalities. Left ventricular diastolic function parameters   were normal. - Aortic valve: Trileaflet; mildly thickened, mildly calcified   leaflets. Transvalvular velocity was minimally increased. 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 Peak velocity (S): 222 cm/s. Valve area   (VTI): 2.02 cm^2. Valve area (Vmax): 2.07 cm^2. - Left atrium: The atrium was mildly dilated. - Tricuspid valve: There was mild regurgitation. - Pulmonary arteries: Systolic pressure was mildly increased. PA   peak pressure: 36 mm Hg (S).  30 day monitor, personal review Sinus rhythm, multiple APCs and PVCs Sinus tachycardia and sinus bradycardia No obvious causes of syncope  ASSESSMENT AND PLAN:  1.  Presyncope: Abrupt in onset associated with typical prodrome. Trigger is not clear.Has worn a 30 day monitor that showed no evidence of tachycardia or bradycardia. We'll plan for Linq  implantation. Risks include bleeding, infection. He understands the risks and has agreed to the procedure.  2. Atrial and ventricular ectopy: Quite a bit of ectopy on the monitor, though it is unclear as to her symptoms. We'll make no further changes.    Current medicines are reviewed at length with the patient today.   The patient does not have concerns regarding his medicines.  The following changes were made today:  none  Labs/ tests ordered today include:  No orders of the defined types were placed in this encounter.    Disposition:   FU with Wynell Halberg pending LINQ  Signed, Cale Bethard Meredith Leeds, MD  07/25/2017 2:00 PM     [place] [address] [phone] (office) [phone] (fax)

## 2017-07-25 NOTE — Progress Notes (Deleted)
Electrophysiology Office Note   Date:  07/25/2017   ID:  Anthony Walls, DOB June 28, 1941, MRN [id number]  PCP:  [name], MD  Cardiologist:  [place] Primary Electrophysiologist:  Tito Ausmus Meredith Leeds, MD    No chief complaint on file.    History of Present Illness: Anthony Walls is a 76 y.o. male who is being seen today for the evaluation of syncope at the request of [name], MD. Presenting today for electrophysiology evaluation. He has a history of multiple myeloma status post chemotherapy, prostate cancer, hypertension, anemia, and potential dementia.   Presented to the hospital in July with an episode of syncope. He had an episode in 2017. [facility] cardiology notes describe a long walk and heat. Urine evaluation of the time showed PVCs. Echocardiogram and stress were recommended, though not performed.  Episodes are abrupt in onset characterized by flushing diaphoresis, nausea, and vomiting. A, he by orthostatic intolerance and presyncope. They abate quickly. The most recent occurred while at church. He became hot and diaphoretic as well as nauseous. When church members tried to help him up he almost lost consciousness. This resolved after a few minutes when he sat down.  Today, he denies*** symptoms of palpitations, chest pain, shortness of breath, orthopnea, PND, lower extremity edema, claudication, dizziness, presyncope, syncope, bleeding, or neurologic sequela. The patient is tolerating medications without difficulties.    Past Medical History:  Diagnosis Date  . Arthritis   . Hesitancy    ?BPH  . Hypertension   . Multiple myeloma ([place]) 2014   Past Surgical History:  Procedure Laterality Date  . BRAIN SURGERY  98   tumor  . LAMINECTOMY N/A 03/20/2013   Procedure: THORACIC LAMINECTOMY FOR TUMOR;  Surgeon: [name], MD;  Location: [place] [facility];  Service: Neurosurgery;  Laterality: N/A;  Thoracic Laminectomy for Thoracic Ten Tumor, Pedicle Screws at  Thoracic Nine through Eleven  . TRANSURETHRAL RESECTION OF PROSTATE       Current Outpatient Prescriptions  Medication Sig Dispense Refill  . acetaminophen (TYLENOL) 325 MG tablet Take 1-2 tablets (325-650 mg total) by mouth every 4 (four) hours as needed. (Patient taking differently: Take 325-650 mg by mouth every 4 (four) hours as needed for mild pain. )    . aspirin EC 81 MG tablet Take 81 mg by mouth every morning.    . bethanechol (URECHOLINE) 25 MG tablet Taper as below: Take one pill four time a day--05/2-05/09/14 Decrease to one pill three times a day from -05/10-05/16/14 Decrease to one pill twice a day from--05/17-05/23 Then take one pill daily till gone. (Patient not taking: Reported on 06/16/2017) 70 tablet 0  . calcium-vitamin D (OSCAL WITH D) 500-200 MG-UNIT tablet Take 1 tablet by mouth 2 (two) times daily.    . cholecalciferol (VITAMIN D) 1000 units tablet Take 2,000 Units by mouth every morning.    . donepezil (ARICEPT) 5 MG tablet Take 5 mg by mouth at bedtime.    . furosemide (LASIX) 20 MG tablet Take 20 mg by mouth at bedtime.    . iron polysaccharides (NIFEREX) 150 MG capsule Take 1 capsule (150 mg total) by mouth 2 (two) times daily before lunch and supper. For anemia (Patient not taking: Reported on 06/10/2017) 60 capsule 1  . PARoxetine (PAXIL) 20 MG tablet Take 20 mg by mouth every morning.    . polyethylene glycol (MIRALAX / GLYCOLAX) packet Take 17 g by mouth 2 (two) times daily. For constipation. (Patient not taking: Reported on  06/10/2017) 14 each   . psyllium (METAMUCIL) 58.6 % powder Take 1 packet by mouth as needed (fiber).    . tamsulosin (FLOMAX) 0.4 MG CAPS Take 1 capsule (0.4 mg total) by mouth 2 (two) times daily. Dose increased to help bladder function. (Patient not taking: Reported on 06/16/2017) 60 capsule 1   No current facility-administered medications for this visit.     Allergies:   Iron polysacch cmplx-b12-fa and Statins   Social History:  The  patient  reports that he has never smoked. He has never used smokeless tobacco. He reports that he does not drink alcohol or use drugs.   Family History:  The patient's family history includes Diabetes in his mother.    ROS:  Please see the history of present illness.   Otherwise, review of systems is positive for ***.   All other systems are reviewed and negative.    PHYSICAL EXAM: VS:  There were no vitals taken for this visit. , BMI There is no height or weight on file to calculate BMI. GEN: Well nourished, well developed, in no acute distress  HEENT: normal  Neck: no JVD, carotid bruits, or masses Cardiac: ***RRR; no murmurs, rubs, or gallops,no edema  Respiratory:  clear to auscultation bilaterally, normal work of breathing GI: soft, nontender, nondistended, + BS MS: no deformity or atrophy  Skin: warm and dry Neuro:  Strength and sensation are intact Psych: euthymic mood, full affect  EKG:  EKG {ACTION; IS/IS ZOX:[id number]} ordered today. Personal review of the ekg ordered shows ***  Recent Labs: 06/16/2017: ALT 22; Magnesium 1.8; TSH 0.850 06/17/2017: BUN 15; Creatinine, Ser 1.45; Hemoglobin 10.6; Platelets 211; Potassium 4.0; Sodium 140    Lipid Panel  No results found for: CHOL, TRIG, HDL, CHOLHDL, VLDL, LDLCALC, LDLDIRECT   Wt Readings from Last 3 Encounters:  06/17/17 220 lb (99.8 kg)  06/12/17 218 lb (98.9 kg)  06/10/17 218 lb (98.9 kg)      Other studies Reviewed: Additional studies/ records that were reviewed today include: TTE 06/17/17  Review of the above records today demonstrates:  - Left ventricle: The cavity size was normal. There was mild   concentric hypertrophy. Systolic function was normal. The   estimated ejection fraction was in the range of 55% to 60%. Wall   motion was normal; there were no regional wall motion   abnormalities. Left ventricular diastolic function parameters   were normal. - Aortic valve: Trileaflet; mildly thickened, mildly  calcified   leaflets. Transvalvular velocity was minimally increased. 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 Peak velocity (S): 222 cm/s. Valve area   (VTI): 2.02 cm^2. Valve area (Vmax): 2.07 cm^2. - Left atrium: The atrium was mildly dilated. - Tricuspid valve: There was mild regurgitation. - Pulmonary arteries: Systolic pressure was mildly increased. PA   peak pressure: 36 mm Hg (S).   ASSESSMENT AND PLAN:  1.  Presyncope: Abrupt in onset associated with typical prodrome. Trigger is not clear.***  2. Atrial and ventricular ectopy:***    Current medicines are reviewed at length with the patient today.   The patient {ACTIONS; HAS/DOES NOT HAVE:19233} concerns regarding his medicines.  The following changes were made today:  {NONE DEFAULTED:18576::"none"}  Labs/ tests ordered today include: *** No orders of the defined types were placed in this encounter.    Disposition:   FU with Derric Dealmeida {gen number [id number]} {Days to years:10300}  Signed, Rimsha Trembley Meredith Leeds, MD  07/25/2017 8:35 AM     [facility]  [address] [place] [address] [phone] (office) [phone] (fax)

## 2017-08-02 ENCOUNTER — Encounter (HOSPITAL_COMMUNITY)
Admission: RE | Disposition: A | Discharge status: Home / Self Care | Payer: Self-pay | Source: Ambulatory Visit | Attending: Cardiology

## 2017-08-02 ENCOUNTER — Ambulatory Visit (HOSPITAL_COMMUNITY)
Admission: RE | Admit: 2017-08-02 | Discharge: 2017-08-02 | Disposition: A | Discharge status: Home / Self Care | Payer: Medicare Other | Source: Ambulatory Visit | Attending: Cardiology | Admitting: Cardiology

## 2017-08-02 ENCOUNTER — Encounter (HOSPITAL_COMMUNITY): Payer: Self-pay | Admitting: Cardiology

## 2017-08-02 DIAGNOSIS — R55 Syncope and collapse: Secondary | ICD-10-CM | POA: Diagnosis not present

## 2017-08-02 HISTORY — PX: LOOP RECORDER INSERTION: EP1214

## 2017-08-02 SURGERY — LOOP RECORDER INSERTION

## 2017-08-02 MED ORDER — LIDOCAINE-EPINEPHRINE 1 %-1:100000 IJ SOLN
INTRAMUSCULAR | Status: DC | PRN
  Administered 2017-08-02: 2 mL via INTRADERMAL

## 2017-08-02 MED ORDER — LIDOCAINE-EPINEPHRINE 1 %-1:100000 IJ SOLN
INTRAMUSCULAR | Status: AC
  Filled 2017-08-02: qty 1

## 2017-08-02 SURGICAL SUPPLY — 2 items
LOOP REVEAL LINQSYS (Prosthesis & Implant Heart) ×3 IMPLANT
PACK LOOP INSERTION (CUSTOM PROCEDURE TRAY) ×3 IMPLANT

## 2017-08-02 NOTE — H&P (Signed)
Anthony Walls is a 76 y.o. male who presents to the hospital after multiple episodes of near syncope. 30 day monitor did not show any abnormalities. He thus presents for [facility] 2 implant. On exam, RRR, no murmurs, lungs clear. Risks and benefits explained. Risks include but not limited to bleeding and infection. The patient understands the risks and has agreed to the procedure.  Tyauna Lacaze Curt Bears, MD 08/02/2017 11:34 AM

## 2017-08-13 ENCOUNTER — Ambulatory Visit (INDEPENDENT_AMBULATORY_CARE_PROVIDER_SITE_OTHER): Payer: Self-pay | Admitting: *Deleted

## 2017-08-13 DIAGNOSIS — R55 Syncope and collapse: Secondary | ICD-10-CM

## 2017-08-13 LAB — CUP PACEART INCLINIC DEVICE CHECK
Date Time Interrogation Session: 20180910092805
MDC IDC PG IMPLANT DT: 20180830

## 2017-08-13 NOTE — Progress Notes (Signed)
Wound check in clinic s/p ILR implant. Wound well healed without redness or edema. Incision edges approximated. Normal ILR device function. Battery status: Good. R-waves 0.25mV. 0 symptom episodes, 0 tachy episodes, 0 pause episodes, 0 brady episodes. 0 AF episodes (0% burden). Monthly summary reports and ROV with WC/H in 3 months. Patient education completed including wound care and remote monitoring.

## 2017-09-03 ENCOUNTER — Ambulatory Visit (INDEPENDENT_AMBULATORY_CARE_PROVIDER_SITE_OTHER): Payer: Medicare Other | Admitting: *Deleted

## 2017-09-03 DIAGNOSIS — R55 Syncope and collapse: Secondary | ICD-10-CM | POA: Diagnosis not present

## 2017-09-04 NOTE — Progress Notes (Signed)
Carelink Summary Report / Loop Recorder 

## 2017-09-06 LAB — CUP PACEART REMOTE DEVICE CHECK
Implantable Pulse Generator Implant Date: 20180830
MDC IDC SESS DTM: 20180929204001

## 2017-10-01 ENCOUNTER — Ambulatory Visit (INDEPENDENT_AMBULATORY_CARE_PROVIDER_SITE_OTHER): Payer: Medicare Other | Admitting: *Deleted

## 2017-10-01 DIAGNOSIS — R55 Syncope and collapse: Secondary | ICD-10-CM

## 2017-10-02 NOTE — Progress Notes (Signed)
Carelink Summary Report / Loop Recorder 

## 2017-10-04 LAB — CUP PACEART REMOTE DEVICE CHECK
Date Time Interrogation Session: 20181029203814
Implantable Pulse Generator Implant Date: 20180830

## 2017-10-24 ENCOUNTER — Encounter: Payer: Self-pay | Admitting: Cardiology

## 2017-10-24 ENCOUNTER — Ambulatory Visit (INDEPENDENT_AMBULATORY_CARE_PROVIDER_SITE_OTHER): Payer: Medicare Other | Admitting: Cardiology

## 2017-10-24 VITALS — BP 127/63 | HR 86 | Ht 66.0 in | Wt 226.0 lb

## 2017-10-24 DIAGNOSIS — I491 Atrial premature depolarization: Secondary | ICD-10-CM

## 2017-10-24 DIAGNOSIS — R55 Syncope and collapse: Secondary | ICD-10-CM

## 2017-10-24 DIAGNOSIS — I493 Ventricular premature depolarization: Secondary | ICD-10-CM | POA: Diagnosis not present

## 2017-10-24 NOTE — Progress Notes (Signed)
Electrophysiology Office Note   Date:  10/24/2017   ID:  [name], DOB 22-May-1941, MRN [id number]  PCP:  [name], MD  Cardiologist:  [place] Primary Electrophysiologist:  Lillith Mcneff Meredith Leeds, MD    Chief Complaint  Patient presents with  . Pacemaker Check    Syncope/Bradycardia/3 months post LINQ implant     History of Present Illness: Anthony Walls is a 76 y.o. male who is being seen today for the evaluation of syncope at the request of [name], MD. Presenting today for electrophysiology evaluation. He has a history of multiple myeloma status post chemotherapy, prostate cancer, hypertension, anemia, and potential dementia.   Presented to the hospital in July with an episode of syncope. He had an episode in 2017. [facility] cardiology notes describe a long walk and heat. Urine evaluation of the time showed PVCs. Echocardiogram and stress were recommended, though not performed.  Episodes are abrupt in onset characterized by flushing diaphoresis, nausea, and vomiting. A, he by orthostatic intolerance and presyncope. They abate quickly. The most recent occurred while at church. He became hot and diaphoretic as well as nauseous. When church members tried to help him up he almost lost consciousness. This resolved after a few minutes when he sat down.  Your 30-day monitor without evidence of tachycardia or bradycardia.  A link monitor was thus implanted on 08/02/17.  Today, denies symptoms of palpitations, chest pain, shortness of breath, orthopnea, PND, lower extremity edema, claudication, dizziness, presyncope, syncope, bleeding, or neurologic sequela. The patient is tolerating medications without difficulties.  He is feeling well without complaint.  He has not had any further episodes of near syncope or syncope.  He has not had palpitations shortness of breath or fatigue.   Past Medical History:  Diagnosis Date  . Arthritis   . Hesitancy    ?BPH  . Hypertension   .  Multiple myeloma ([place]) 2014   Past Surgical History:  Procedure Laterality Date  . BRAIN SURGERY  98   tumor  . LAMINECTOMY N/A 03/20/2013   Procedure: THORACIC LAMINECTOMY FOR TUMOR;  Surgeon: [name], MD;  Location: [place] [facility];  Service: Neurosurgery;  Laterality: N/A;  Thoracic Laminectomy for Thoracic Ten Tumor, Pedicle Screws at Thoracic Nine through Eleven  . LOOP RECORDER INSERTION N/A 08/02/2017   Procedure: LOOP RECORDER INSERTION;  Surgeon: Constance Haw, MD;  Location: [place] [facility];  Service: Cardiovascular;  Laterality: N/A;  . TRANSURETHRAL RESECTION OF PROSTATE       Current Outpatient Medications  Medication Sig Dispense Refill  . acetaminophen (TYLENOL) 325 MG tablet Take 1-2 tablets (325-650 mg total) by mouth every 4 (four) hours as needed. (Patient taking differently: Take 325-650 mg by mouth every 4 (four) hours as needed for mild pain. )    . aspirin EC 81 MG tablet Take 81 mg by mouth daily.     . calcium-vitamin D (OSCAL WITH D) 500-200 MG-UNIT tablet Take 1 tablet by mouth 2 (two) times daily.    Marland Kitchen lenalidomide (REVLIMID) 5 MG capsule Take 5 mg by mouth See admin instructions. Take cyclically on 2 weeks, and off 1 week (on 14 days, off 7 days)    . PARoxetine (PAXIL) 20 MG tablet Take 20 mg by mouth every evening.     . psyllium (METAMUCIL) 58.6 % powder Take 1 packet by mouth daily as needed (fiber).      No current facility-administered medications for this visit.     Allergies:  Iron polysacch cmplx-b12-fa and Statins   Social History:  The patient  reports that  has never smoked. he has never used smokeless tobacco. He reports that he does not drink alcohol or use drugs.   Family History:  The patient's family history includes Diabetes in his mother.   ROS:  Please see the history of present illness.   Otherwise, review of systems is positive for none.   All other systems are reviewed and negative.   PHYSICAL EXAM: VS:  BP 127/63    Pulse 86   Ht '5\' 6"'$  (1.676 m)   Wt 226 lb (102.5 kg)   BMI 36.48 kg/m  , BMI Body mass index is 36.48 kg/m. GEN: Well nourished, well developed, in no acute distress  HEENT: normal  Neck: no JVD, carotid bruits, or masses Cardiac: RRR; no murmurs, rubs, or gallops,no edema  Respiratory:  clear to auscultation bilaterally, normal work of breathing GI: soft, nontender, nondistended, + BS MS: no deformity or atrophy  Skin: warm and dry, device site well healed Neuro:  Strength and sensation are intact Psych: euthymic mood, full affect  EKG:  EKG is ordered today. Personal review of the ekg ordered shows sinus rhythm, PVCs,   Personal review of the device interrogation today. Results in [place]: 06/16/2017: ALT 22; Magnesium 1.8; TSH 0.850 06/17/2017: BUN 15; Creatinine, Ser 1.45; Hemoglobin 10.6; Platelets 211; Potassium 4.0; Sodium 140    Lipid Panel  No results found for: CHOL, TRIG, HDL, CHOLHDL, VLDL, LDLCALC, LDLDIRECT   Wt Readings from Last 3 Encounters:  10/24/17 226 lb (102.5 kg)  08/02/17 218 lb (98.9 kg)  07/25/17 221 lb (100.2 kg)      Other studies Reviewed: Additional studies/ records that were reviewed today include: TTE 06/17/17  Review of the above records today demonstrates:  - Left ventricle: The cavity size was normal. There was mild   concentric hypertrophy. Systolic function was normal. The   estimated ejection fraction was in the range of 55% to 60%. Wall   motion was normal; there were no regional wall motion   abnormalities. Left ventricular diastolic function parameters   were normal. - Aortic valve: Trileaflet; mildly thickened, mildly calcified   leaflets. Transvalvular velocity was minimally increased. 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 Peak velocity (S): 222 cm/s. Valve area   (VTI): 2.02 cm^2. Valve area (Vmax): 2.07 cm^2. - Left atrium: The atrium was mildly dilated. - Tricuspid valve: There was mild regurgitation. - Pulmonary  arteries: Systolic pressure was mildly increased. PA   peak pressure: 36 mm Hg (S).  30 day monitor, personal review Sinus rhythm, multiple APCs and PVCs Sinus tachycardia and sinus bradycardia No obvious causes of syncope  ASSESSMENT AND PLAN:  1.  Presyncope: Further episodes of presyncope.  His link monitor is functioning appropriately.  No tachycardia or bradycardia noted.  2. Atrial and ventricular ectopy: 10 use to have both a and V ectopy.  Patient is asymptomatic.  We Maybell Misenheimer    Current medicines are reviewed at length with the patient today.   The patient does not have concerns regarding his medicines.  The following changes were made today: None  Labs/ tests ordered today include:  Orders Placed This Encounter  Procedures  . EKG 12-Lead     Disposition:   FU with Arnola Crittendon as needed pending link results  Signed, Kattleya Kuhnert Meredith Leeds, MD  10/24/2017 2:57 PM     [facility] [place]  [address] [place] [address] [phone] (office) [phone] (fax)

## 2017-10-24 NOTE — Patient Instructions (Signed)
Medication Instructions:  Your physician recommends that you continue on your current medications as directed. Please refer to the Current Medication list given to you today.  * If you need a refill on your cardiac medications before your next appointment, please call your pharmacy.   Labwork: None ordered  Testing/Procedures: None ordered  Follow-Up: No follow up is needed at this time with Dr. Camnitz.  He will see you on an as needed basis.   Thank you for choosing [facility]!!   Laniya Friedl, RN [phone]     

## 2017-10-31 ENCOUNTER — Ambulatory Visit (INDEPENDENT_AMBULATORY_CARE_PROVIDER_SITE_OTHER): Payer: Medicare Other | Admitting: *Deleted

## 2017-10-31 DIAGNOSIS — R55 Syncope and collapse: Secondary | ICD-10-CM | POA: Diagnosis not present

## 2017-11-01 NOTE — Progress Notes (Signed)
Carelink Summary Report / Loop Recorder 

## 2017-11-15 LAB — CUP PACEART REMOTE DEVICE CHECK
Date Time Interrogation Session: 20181128204004
Implantable Pulse Generator Implant Date: 20180830

## 2017-11-30 ENCOUNTER — Ambulatory Visit (INDEPENDENT_AMBULATORY_CARE_PROVIDER_SITE_OTHER): Payer: Medicare Other | Admitting: *Deleted

## 2017-11-30 DIAGNOSIS — R55 Syncope and collapse: Secondary | ICD-10-CM | POA: Diagnosis not present

## 2017-12-03 NOTE — Progress Notes (Signed)
Carelink Summary Report / Loop Recorder 

## 2017-12-10 LAB — CUP PACEART REMOTE DEVICE CHECK
Date Time Interrogation Session: 20181228211113
MDC IDC PG IMPLANT DT: 20180830

## 2017-12-31 ENCOUNTER — Ambulatory Visit (INDEPENDENT_AMBULATORY_CARE_PROVIDER_SITE_OTHER): Payer: Medicare HMO | Admitting: *Deleted

## 2017-12-31 DIAGNOSIS — R55 Syncope and collapse: Secondary | ICD-10-CM

## 2017-12-31 NOTE — Progress Notes (Signed)
Carelink Summary Report / Loop Recorder 

## 2018-01-10 LAB — CUP PACEART REMOTE DEVICE CHECK
Date Time Interrogation Session: 20190127213834
Implantable Pulse Generator Implant Date: 20180830

## 2018-01-25 ENCOUNTER — Telehealth: Payer: Self-pay

## 2018-01-25 NOTE — Telephone Encounter (Signed)
Called pt regarding tachy episode from 01/14/18, LVM for pt to call back.

## 2018-01-25 NOTE — Telephone Encounter (Signed)
Spoke with pt and pt's wife pt could not recall anything specific from that date and time, pt and pts wife appreciative of call back.

## 2018-02-01 ENCOUNTER — Ambulatory Visit (INDEPENDENT_AMBULATORY_CARE_PROVIDER_SITE_OTHER): Payer: Medicare HMO | Admitting: *Deleted

## 2018-02-01 DIAGNOSIS — R55 Syncope and collapse: Secondary | ICD-10-CM

## 2018-02-04 NOTE — Progress Notes (Signed)
Carelink Summary Report / Loop Recorder 

## 2018-02-07 ENCOUNTER — Other Ambulatory Visit: Payer: Self-pay | Admitting: Cardiology

## 2018-02-15 IMAGING — CR DG CHEST 2V
2 series · 2 of 2 positions shown · non-contrast
Comparison: 03/19/2013

CLINICAL DATA: Patient states that while at church he overheated
and passed out. Feeling much better.

EXAM:
CHEST  2 VIEW

[chest pa]
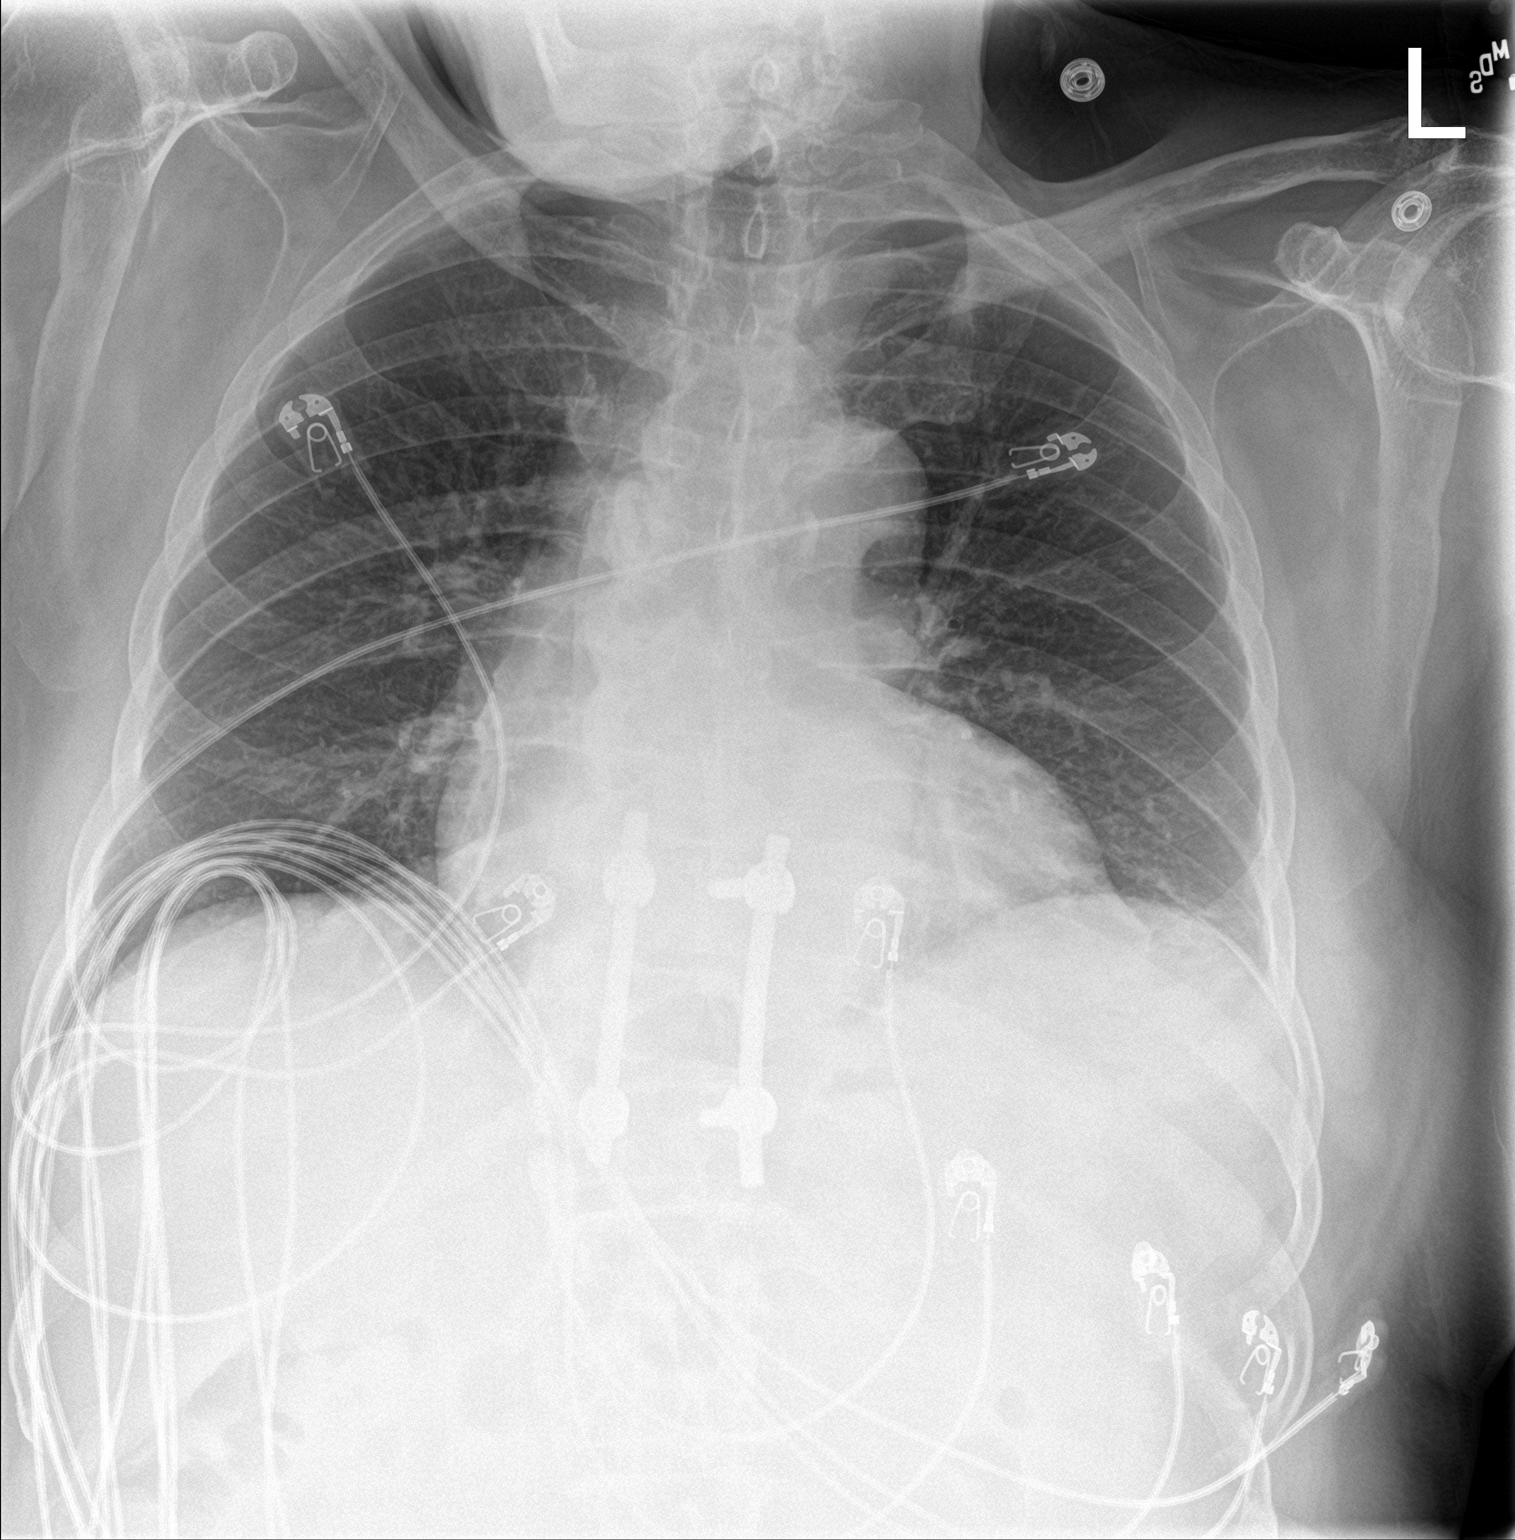

[chest lat]
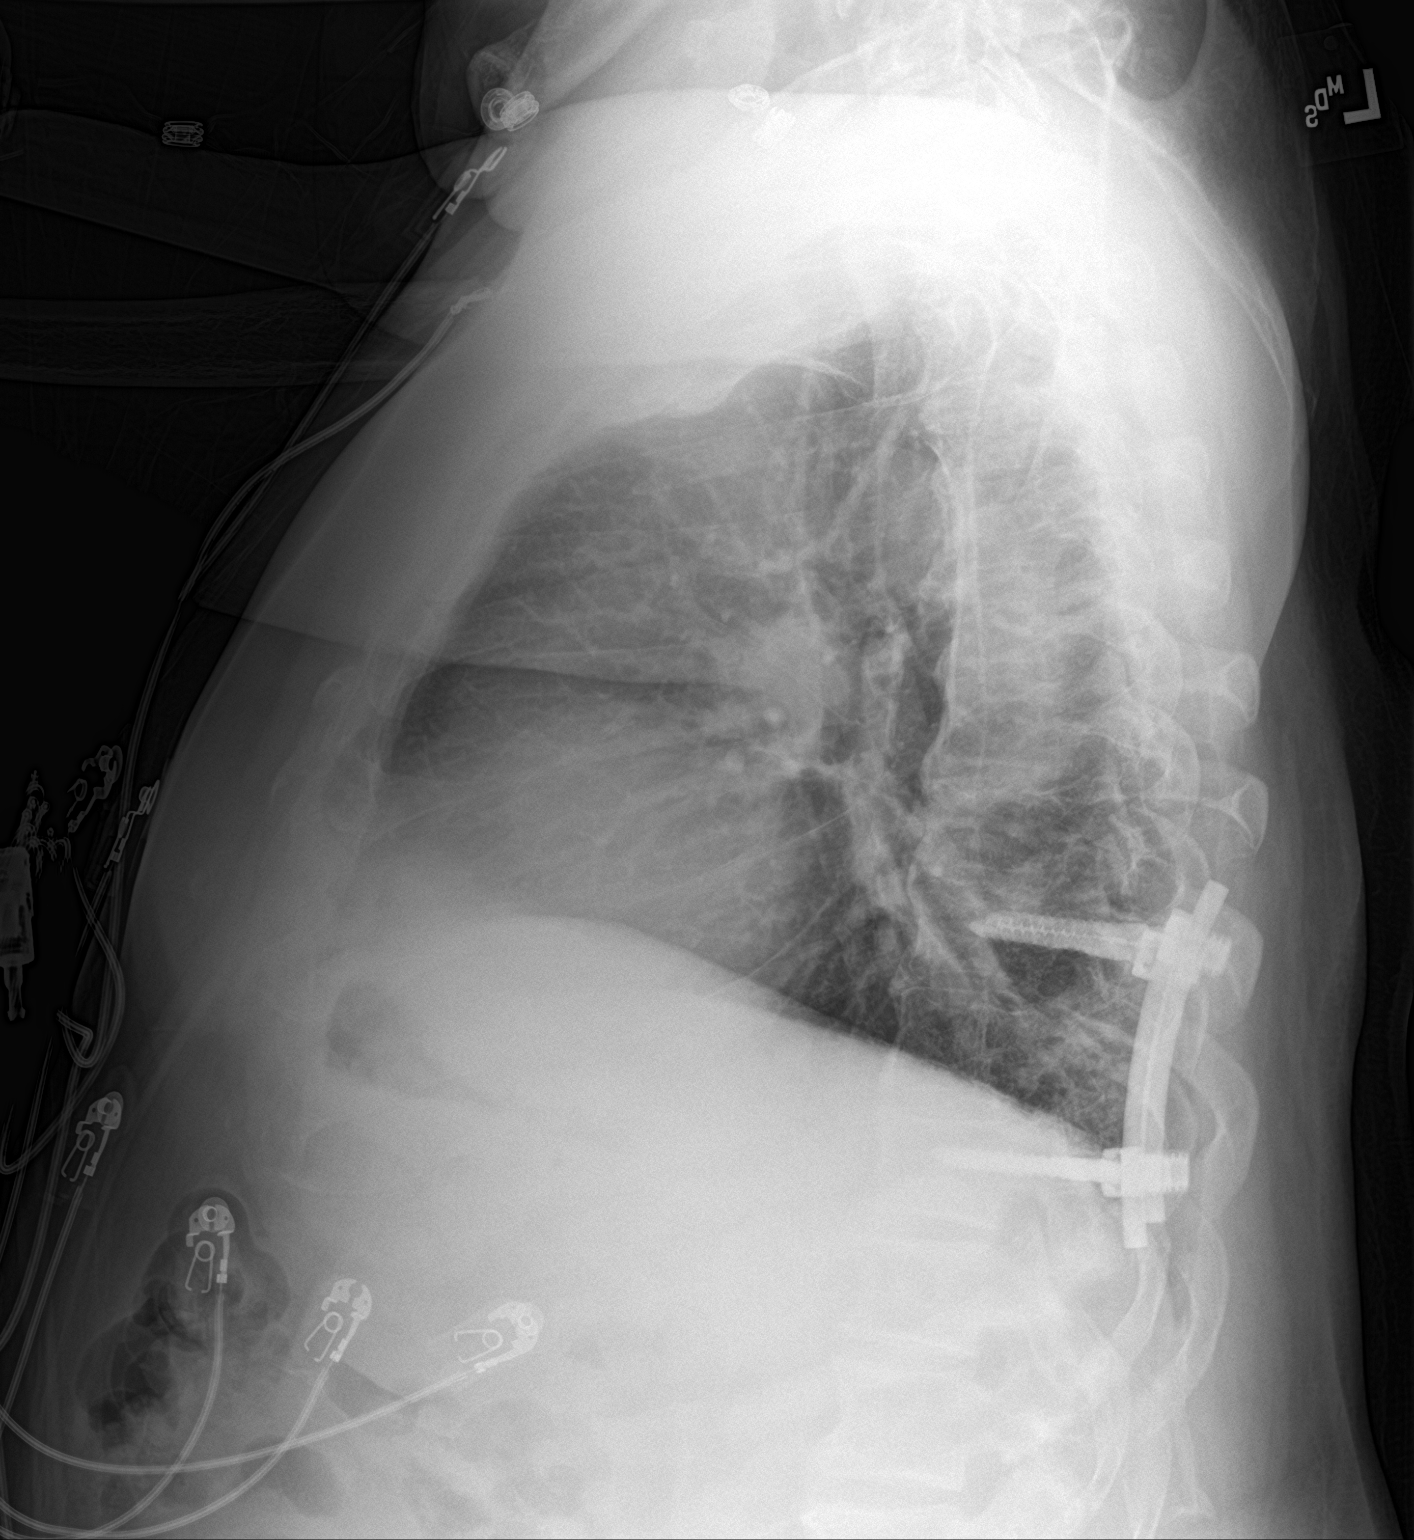

[2 of 2 positions shown; findings below may reference images not displayed]

FINDINGS: Thoracolumbar spine fixation. Lateral view degraded by patient arm
position. Moderate thoracic spondylosis. Numerous leads and wires
project over the chest. Midline trachea. Moderate cardiomegaly No
pleural effusion or pneumothorax. No congestive failure.
IMPRESSION: No acute cardiopulmonary disease.

Cardiomegaly without congestive failure.

## 2018-02-18 ENCOUNTER — Telehealth: Payer: Self-pay

## 2018-02-18 NOTE — Telephone Encounter (Signed)
Spoke with patient who states that he does not recall any symptoms of lightheadedness ro dizzy spells. He reports being at church around the time of the episode and comments that sometimes he gets excited and dances. I explained that we will continue to monitor and instructed him to call with symptoms. Patient verbalized understanding.

## 2018-03-06 ENCOUNTER — Ambulatory Visit (INDEPENDENT_AMBULATORY_CARE_PROVIDER_SITE_OTHER): Payer: Medicare HMO | Admitting: *Deleted

## 2018-03-06 DIAGNOSIS — R55 Syncope and collapse: Secondary | ICD-10-CM | POA: Diagnosis not present

## 2018-03-07 NOTE — Progress Notes (Signed)
Carelink Summary Report / Loop Recorder 

## 2018-03-11 LAB — CUP PACEART REMOTE DEVICE CHECK
Date Time Interrogation Session: 20190301214038
MDC IDC PG IMPLANT DT: 20180830

## 2018-04-08 ENCOUNTER — Ambulatory Visit (INDEPENDENT_AMBULATORY_CARE_PROVIDER_SITE_OTHER): Payer: Medicare HMO | Admitting: *Deleted

## 2018-04-08 DIAGNOSIS — R55 Syncope and collapse: Secondary | ICD-10-CM

## 2018-04-09 NOTE — Progress Notes (Signed)
Carelink Summary Report / Loop Recorder 

## 2018-04-10 LAB — CUP PACEART REMOTE DEVICE CHECK
Date Time Interrogation Session: 20190403220859
MDC IDC PG IMPLANT DT: 20180830

## 2018-04-16 ENCOUNTER — Other Ambulatory Visit: Payer: Self-pay | Admitting: Cardiology

## 2018-04-30 LAB — CUP PACEART REMOTE DEVICE CHECK
Date Time Interrogation Session: 20190506220832
Implantable Pulse Generator Implant Date: 20180830

## 2018-05-13 ENCOUNTER — Ambulatory Visit (INDEPENDENT_AMBULATORY_CARE_PROVIDER_SITE_OTHER): Payer: Medicare HMO | Admitting: *Deleted

## 2018-05-13 DIAGNOSIS — R55 Syncope and collapse: Secondary | ICD-10-CM

## 2018-05-13 NOTE — Progress Notes (Signed)
Carelink Summary Report / Loop Recorder 

## 2018-06-13 ENCOUNTER — Ambulatory Visit (INDEPENDENT_AMBULATORY_CARE_PROVIDER_SITE_OTHER): Payer: Medicare HMO | Admitting: *Deleted

## 2018-06-13 DIAGNOSIS — R55 Syncope and collapse: Secondary | ICD-10-CM

## 2018-06-14 NOTE — Progress Notes (Signed)
Carelink Summary Report / Loop Recorder 

## 2018-06-18 LAB — CUP PACEART REMOTE DEVICE CHECK
Date Time Interrogation Session: 20190608224101
Implantable Pulse Generator Implant Date: 20180830

## 2018-07-16 ENCOUNTER — Ambulatory Visit (INDEPENDENT_AMBULATORY_CARE_PROVIDER_SITE_OTHER): Payer: Medicare HMO | Admitting: *Deleted

## 2018-07-16 DIAGNOSIS — R55 Syncope and collapse: Secondary | ICD-10-CM

## 2018-07-17 NOTE — Progress Notes (Signed)
Carelink Summary Report / Loop Recorder 

## 2018-07-25 LAB — CUP PACEART REMOTE DEVICE CHECK
Implantable Pulse Generator Implant Date: 20180830
MDC IDC SESS DTM: 20190711223904

## 2018-08-19 ENCOUNTER — Ambulatory Visit (INDEPENDENT_AMBULATORY_CARE_PROVIDER_SITE_OTHER): Payer: Medicare HMO | Admitting: *Deleted

## 2018-08-19 DIAGNOSIS — R55 Syncope and collapse: Secondary | ICD-10-CM

## 2018-08-19 NOTE — Progress Notes (Signed)
Carelink Summary Report / Loop Recorder 

## 2018-08-22 LAB — CUP PACEART REMOTE DEVICE CHECK
Implantable Pulse Generator Implant Date: 20180830
MDC IDC SESS DTM: 20190813234111

## 2018-09-01 LAB — CUP PACEART REMOTE DEVICE CHECK
Implantable Pulse Generator Implant Date: 20180830
MDC IDC SESS DTM: 20190916000550

## 2018-09-20 ENCOUNTER — Ambulatory Visit (INDEPENDENT_AMBULATORY_CARE_PROVIDER_SITE_OTHER): Payer: 59 | Admitting: *Deleted

## 2018-09-20 DIAGNOSIS — R55 Syncope and collapse: Secondary | ICD-10-CM | POA: Diagnosis not present

## 2018-09-23 NOTE — Progress Notes (Signed)
Carelink Summary Report / Loop Recorder 

## 2018-10-08 LAB — CUP PACEART REMOTE DEVICE CHECK
MDC IDC PG IMPLANT DT: 20180830
MDC IDC SESS DTM: 20191019004018

## 2018-10-23 ENCOUNTER — Ambulatory Visit (INDEPENDENT_AMBULATORY_CARE_PROVIDER_SITE_OTHER): Payer: Medicare HMO

## 2018-10-23 DIAGNOSIS — R55 Syncope and collapse: Secondary | ICD-10-CM | POA: Diagnosis not present

## 2018-10-24 NOTE — Progress Notes (Signed)
Carelink Summary Report / Loop Recorder 

## 2018-11-25 ENCOUNTER — Ambulatory Visit (INDEPENDENT_AMBULATORY_CARE_PROVIDER_SITE_OTHER): Payer: Medicare HMO

## 2018-11-25 DIAGNOSIS — R55 Syncope and collapse: Secondary | ICD-10-CM | POA: Diagnosis not present

## 2018-11-26 LAB — CUP PACEART REMOTE DEVICE CHECK
Implantable Pulse Generator Implant Date: 20180830
MDC IDC SESS DTM: 20191224004014

## 2018-11-26 NOTE — Progress Notes (Signed)
Carelink Summary Report / Loop Recorder 

## 2018-12-08 LAB — CUP PACEART REMOTE DEVICE CHECK
Date Time Interrogation Session: 20191121003924
MDC IDC PG IMPLANT DT: 20180830

## 2018-12-30 ENCOUNTER — Ambulatory Visit (INDEPENDENT_AMBULATORY_CARE_PROVIDER_SITE_OTHER): Payer: Medicare HMO

## 2018-12-30 DIAGNOSIS — R55 Syncope and collapse: Secondary | ICD-10-CM

## 2018-12-31 LAB — CUP PACEART REMOTE DEVICE CHECK
Date Time Interrogation Session: 20200126010934
MDC IDC PG IMPLANT DT: 20180830

## 2018-12-31 NOTE — Progress Notes (Signed)
Carelink Summary Report / Loop Recorder 

## 2019-01-30 ENCOUNTER — Ambulatory Visit (INDEPENDENT_AMBULATORY_CARE_PROVIDER_SITE_OTHER): Payer: Medicare HMO | Admitting: *Deleted

## 2019-01-30 DIAGNOSIS — R55 Syncope and collapse: Secondary | ICD-10-CM

## 2019-01-31 LAB — CUP PACEART REMOTE DEVICE CHECK
Implantable Pulse Generator Implant Date: 20180830
MDC IDC SESS DTM: 20200228013532

## 2019-02-05 NOTE — Progress Notes (Signed)
Carelink Summary Report / Loop Recorder 

## 2019-03-04 ENCOUNTER — Other Ambulatory Visit: Payer: Self-pay

## 2019-03-04 ENCOUNTER — Ambulatory Visit (INDEPENDENT_AMBULATORY_CARE_PROVIDER_SITE_OTHER): Payer: Medicare HMO | Admitting: *Deleted

## 2019-03-04 DIAGNOSIS — R55 Syncope and collapse: Secondary | ICD-10-CM | POA: Diagnosis not present

## 2019-03-05 LAB — CUP PACEART REMOTE DEVICE CHECK
Date Time Interrogation Session: 20200401021116
Implantable Pulse Generator Implant Date: 20180830

## 2019-03-12 NOTE — Progress Notes (Signed)
Carelink Summary Report / Loop Recorder 

## 2019-04-07 ENCOUNTER — Ambulatory Visit (INDEPENDENT_AMBULATORY_CARE_PROVIDER_SITE_OTHER): Payer: Medicare HMO | Admitting: *Deleted

## 2019-04-07 ENCOUNTER — Other Ambulatory Visit: Payer: Self-pay

## 2019-04-07 DIAGNOSIS — R55 Syncope and collapse: Secondary | ICD-10-CM

## 2019-04-07 LAB — CUP PACEART REMOTE DEVICE CHECK
Date Time Interrogation Session: 20200504023929
Implantable Pulse Generator Implant Date: 20180830

## 2019-04-14 NOTE — Progress Notes (Signed)
Carelink Summary Report / Loop Recorder 

## 2019-05-09 ENCOUNTER — Ambulatory Visit (INDEPENDENT_AMBULATORY_CARE_PROVIDER_SITE_OTHER): Payer: Medicare HMO | Admitting: *Deleted

## 2019-05-09 DIAGNOSIS — R55 Syncope and collapse: Secondary | ICD-10-CM | POA: Diagnosis not present

## 2019-05-11 LAB — CUP PACEART REMOTE DEVICE CHECK
Date Time Interrogation Session: 20200606033702
Implantable Pulse Generator Implant Date: 20180830

## 2019-05-16 NOTE — Progress Notes (Signed)
Carelink Summary Report / Loop Recorder 

## 2019-06-11 ENCOUNTER — Ambulatory Visit (INDEPENDENT_AMBULATORY_CARE_PROVIDER_SITE_OTHER): Payer: Medicare HMO | Admitting: *Deleted

## 2019-06-11 DIAGNOSIS — R55 Syncope and collapse: Secondary | ICD-10-CM

## 2019-06-12 LAB — CUP PACEART REMOTE DEVICE CHECK
Date Time Interrogation Session: 20200709101009
Implantable Pulse Generator Implant Date: 20180830

## 2019-06-23 NOTE — Progress Notes (Signed)
Carelink Summary Report / Loop Recorder 

## 2019-07-14 ENCOUNTER — Ambulatory Visit (INDEPENDENT_AMBULATORY_CARE_PROVIDER_SITE_OTHER): Payer: Medicare HMO | Admitting: *Deleted

## 2019-07-14 DIAGNOSIS — R55 Syncope and collapse: Secondary | ICD-10-CM

## 2019-07-15 LAB — CUP PACEART REMOTE DEVICE CHECK
Date Time Interrogation Session: 20200811093305
Implantable Pulse Generator Implant Date: 20180830

## 2019-07-22 NOTE — Progress Notes (Signed)
Carelink Summary Report / Loop Recorder 

## 2019-08-18 ENCOUNTER — Ambulatory Visit (INDEPENDENT_AMBULATORY_CARE_PROVIDER_SITE_OTHER): Payer: Medicare HMO | Admitting: *Deleted

## 2019-08-18 DIAGNOSIS — R55 Syncope and collapse: Secondary | ICD-10-CM

## 2019-08-18 LAB — CUP PACEART REMOTE DEVICE CHECK
Date Time Interrogation Session: 20200913113921
Implantable Pulse Generator Implant Date: 20180830

## 2019-08-29 NOTE — Progress Notes (Signed)
Carelink Summary Report / Loop Recorder 

## 2019-09-19 ENCOUNTER — Ambulatory Visit (INDEPENDENT_AMBULATORY_CARE_PROVIDER_SITE_OTHER): Payer: Medicare HMO | Admitting: *Deleted

## 2019-09-19 DIAGNOSIS — R55 Syncope and collapse: Secondary | ICD-10-CM

## 2019-09-19 DIAGNOSIS — R001 Bradycardia, unspecified: Secondary | ICD-10-CM

## 2019-09-21 LAB — CUP PACEART REMOTE DEVICE CHECK
Date Time Interrogation Session: 20201016113906
Implantable Pulse Generator Implant Date: 20180830

## 2019-09-24 NOTE — Progress Notes (Signed)
Carelink Summary Report / Loop Recorder 

## 2019-10-22 ENCOUNTER — Ambulatory Visit (INDEPENDENT_AMBULATORY_CARE_PROVIDER_SITE_OTHER): Payer: Medicare HMO | Admitting: *Deleted

## 2019-10-22 DIAGNOSIS — R55 Syncope and collapse: Secondary | ICD-10-CM

## 2019-10-23 LAB — CUP PACEART REMOTE DEVICE CHECK
Date Time Interrogation Session: 20201118093532
Implantable Pulse Generator Implant Date: 20180830

## 2019-11-20 NOTE — Progress Notes (Signed)
Carelink Summary Report / Loop Recorder 

## 2019-11-24 ENCOUNTER — Ambulatory Visit (INDEPENDENT_AMBULATORY_CARE_PROVIDER_SITE_OTHER): Payer: Medicare HMO | Admitting: *Deleted

## 2019-11-24 DIAGNOSIS — R55 Syncope and collapse: Secondary | ICD-10-CM | POA: Diagnosis not present

## 2019-11-24 LAB — CUP PACEART REMOTE DEVICE CHECK
Date Time Interrogation Session: 20201221093726
Implantable Pulse Generator Implant Date: 20180830

## 2019-12-29 ENCOUNTER — Ambulatory Visit (INDEPENDENT_AMBULATORY_CARE_PROVIDER_SITE_OTHER): Payer: Medicare HMO | Admitting: *Deleted

## 2019-12-29 DIAGNOSIS — R55 Syncope and collapse: Secondary | ICD-10-CM

## 2019-12-29 LAB — CUP PACEART REMOTE DEVICE CHECK
Date Time Interrogation Session: 20210124232223
Implantable Pulse Generator Implant Date: 20180830

## 2020-02-02 ENCOUNTER — Ambulatory Visit (INDEPENDENT_AMBULATORY_CARE_PROVIDER_SITE_OTHER): Payer: Medicare HMO | Admitting: *Deleted

## 2020-02-02 DIAGNOSIS — R55 Syncope and collapse: Secondary | ICD-10-CM | POA: Diagnosis not present

## 2020-02-02 LAB — CUP PACEART REMOTE DEVICE CHECK
Date Time Interrogation Session: 20210228234449
Implantable Pulse Generator Implant Date: 20180830

## 2020-02-03 NOTE — Progress Notes (Signed)
ILR Remote 

## 2020-03-08 ENCOUNTER — Ambulatory Visit (INDEPENDENT_AMBULATORY_CARE_PROVIDER_SITE_OTHER): Payer: Medicare HMO | Admitting: *Deleted

## 2020-03-08 DIAGNOSIS — R55 Syncope and collapse: Secondary | ICD-10-CM

## 2020-03-09 LAB — CUP PACEART REMOTE DEVICE CHECK
Date Time Interrogation Session: 20210401004806
Implantable Pulse Generator Implant Date: 20180830

## 2020-04-08 LAB — CUP PACEART REMOTE DEVICE CHECK
Date Time Interrogation Session: 20210502005117
Implantable Pulse Generator Implant Date: 20180830

## 2020-04-12 ENCOUNTER — Ambulatory Visit (INDEPENDENT_AMBULATORY_CARE_PROVIDER_SITE_OTHER): Payer: Medicare HMO | Admitting: *Deleted

## 2020-04-12 DIAGNOSIS — R55 Syncope and collapse: Secondary | ICD-10-CM | POA: Diagnosis not present

## 2020-04-13 NOTE — Progress Notes (Signed)
Carelink Summary Report / Loop Recorder 

## 2020-05-17 ENCOUNTER — Ambulatory Visit (INDEPENDENT_AMBULATORY_CARE_PROVIDER_SITE_OTHER): Payer: Medicare HMO | Admitting: *Deleted

## 2020-05-17 DIAGNOSIS — R55 Syncope and collapse: Secondary | ICD-10-CM | POA: Diagnosis not present

## 2020-05-17 LAB — CUP PACEART REMOTE DEVICE CHECK
Date Time Interrogation Session: 20210613232847
Implantable Pulse Generator Implant Date: 20180830

## 2020-05-19 NOTE — Progress Notes (Signed)
Carelink Summary Report / Loop Recorder 

## 2020-06-21 ENCOUNTER — Ambulatory Visit (INDEPENDENT_AMBULATORY_CARE_PROVIDER_SITE_OTHER): Payer: Medicare HMO | Admitting: *Deleted

## 2020-06-21 DIAGNOSIS — R55 Syncope and collapse: Secondary | ICD-10-CM

## 2020-06-21 LAB — CUP PACEART REMOTE DEVICE CHECK
Date Time Interrogation Session: 20210718230624
Implantable Pulse Generator Implant Date: 20180830

## 2020-06-23 NOTE — Progress Notes (Signed)
Carelink Summary Report / Loop Recorder 

## 2020-07-24 LAB — CUP PACEART REMOTE DEVICE CHECK
Date Time Interrogation Session: 20210818232726
Implantable Pulse Generator Implant Date: 20180830

## 2020-07-26 ENCOUNTER — Ambulatory Visit (INDEPENDENT_AMBULATORY_CARE_PROVIDER_SITE_OTHER): Payer: Medicare HMO | Admitting: *Deleted

## 2020-07-26 DIAGNOSIS — R55 Syncope and collapse: Secondary | ICD-10-CM

## 2020-08-02 NOTE — Progress Notes (Signed)
Carelink Summary Report / Loop Recorder 

## 2020-08-29 LAB — CUP PACEART REMOTE DEVICE CHECK
Date Time Interrogation Session: 20210918232227
Implantable Pulse Generator Implant Date: 20180830

## 2020-08-30 ENCOUNTER — Ambulatory Visit (INDEPENDENT_AMBULATORY_CARE_PROVIDER_SITE_OTHER): Payer: Medicare HMO | Admitting: Emergency Medicine

## 2020-08-30 DIAGNOSIS — R55 Syncope and collapse: Secondary | ICD-10-CM

## 2020-09-01 NOTE — Progress Notes (Signed)
Carelink Summary Report / Loop Recorder 

## 2020-09-22 ENCOUNTER — Ambulatory Visit (INDEPENDENT_AMBULATORY_CARE_PROVIDER_SITE_OTHER): Payer: Medicare HMO

## 2020-09-22 DIAGNOSIS — R55 Syncope and collapse: Secondary | ICD-10-CM | POA: Diagnosis not present

## 2020-09-23 LAB — CUP PACEART REMOTE DEVICE CHECK
Date Time Interrogation Session: 20211019233244
Implantable Pulse Generator Implant Date: 20180830

## 2020-09-28 NOTE — Progress Notes (Signed)
Carelink Summary Report / Loop Recorder 

## 2020-10-25 ENCOUNTER — Ambulatory Visit (INDEPENDENT_AMBULATORY_CARE_PROVIDER_SITE_OTHER): Payer: Medicare HMO

## 2020-10-25 DIAGNOSIS — R55 Syncope and collapse: Secondary | ICD-10-CM | POA: Diagnosis not present

## 2020-10-25 LAB — CUP PACEART REMOTE DEVICE CHECK
Date Time Interrogation Session: 20211119223608
Implantable Pulse Generator Implant Date: 20180830

## 2020-10-27 NOTE — Progress Notes (Signed)
Carelink Summary Report / Loop Recorder 

## 2020-11-23 ENCOUNTER — Ambulatory Visit (INDEPENDENT_AMBULATORY_CARE_PROVIDER_SITE_OTHER): Payer: Medicare HMO

## 2020-11-23 DIAGNOSIS — R55 Syncope and collapse: Secondary | ICD-10-CM | POA: Diagnosis not present

## 2020-11-23 LAB — CUP PACEART REMOTE DEVICE CHECK
Date Time Interrogation Session: 20211220223359
Implantable Pulse Generator Implant Date: 20180830

## 2020-12-08 NOTE — Progress Notes (Signed)
Carelink Summary Report / Loop Recorder 

## 2020-12-13 ENCOUNTER — Telehealth: Payer: Self-pay | Admitting: Cardiology

## 2020-12-13 NOTE — Telephone Encounter (Signed)
New message:     Patient calling stating that he has a monitor and he states some one told him three years ago they told him that it would be fine and that he can get it remove. Please advise.

## 2020-12-13 NOTE — Telephone Encounter (Signed)
Attempted to reach pt multiple times over an hour, busy signal.

## 2020-12-14 NOTE — Telephone Encounter (Signed)
The pt gave me permission to speak with his wife. I let her know that the loop has not reached RRT as of yet. I let her know sometimes those monitors go a little longer than 3 years.  I told her once it reached RRt the nurse will call them and schedule an appointment to speak with the Doctor to determined if the patient needs to continued to be monitored.

## 2020-12-24 ENCOUNTER — Ambulatory Visit (INDEPENDENT_AMBULATORY_CARE_PROVIDER_SITE_OTHER): Payer: Medicare HMO

## 2020-12-24 DIAGNOSIS — R55 Syncope and collapse: Secondary | ICD-10-CM | POA: Diagnosis not present

## 2020-12-24 LAB — CUP PACEART REMOTE DEVICE CHECK
Date Time Interrogation Session: 20220120223814
Implantable Pulse Generator Implant Date: 20180830

## 2021-01-06 NOTE — Progress Notes (Signed)
Carelink Summary Report / Loop Recorder 

## 2021-01-24 ENCOUNTER — Ambulatory Visit (INDEPENDENT_AMBULATORY_CARE_PROVIDER_SITE_OTHER): Payer: Medicare HMO

## 2021-01-24 DIAGNOSIS — R55 Syncope and collapse: Secondary | ICD-10-CM | POA: Diagnosis not present

## 2021-01-26 LAB — CUP PACEART REMOTE DEVICE CHECK
Date Time Interrogation Session: 20220222224035
Implantable Pulse Generator Implant Date: 20180830

## 2021-02-01 NOTE — Progress Notes (Signed)
Carelink Summary Report / Loop Recorder 

## 2021-02-24 ENCOUNTER — Ambulatory Visit (INDEPENDENT_AMBULATORY_CARE_PROVIDER_SITE_OTHER): Payer: Medicare HMO

## 2021-02-24 DIAGNOSIS — R55 Syncope and collapse: Secondary | ICD-10-CM

## 2021-02-28 LAB — CUP PACEART REMOTE DEVICE CHECK
Date Time Interrogation Session: 20220327234557
Implantable Pulse Generator Implant Date: 20180830

## 2021-03-08 NOTE — Progress Notes (Signed)
Carelink Summary Report / Loop Recorder 

## 2021-03-28 ENCOUNTER — Ambulatory Visit (INDEPENDENT_AMBULATORY_CARE_PROVIDER_SITE_OTHER): Payer: Medicare HMO

## 2021-03-28 DIAGNOSIS — R55 Syncope and collapse: Secondary | ICD-10-CM | POA: Diagnosis not present

## 2021-03-29 LAB — CUP PACEART REMOTE DEVICE CHECK
Date Time Interrogation Session: 20220425231042
Implantable Pulse Generator Implant Date: 20180830

## 2021-04-15 NOTE — Progress Notes (Signed)
Carelink Summary Report / Loop Recorder 

## 2021-04-26 ENCOUNTER — Telehealth: Payer: Self-pay | Admitting: Emergency Medicine

## 2021-04-26 NOTE — Telephone Encounter (Signed)
Attempted to contact patient about his loop recorder reaching RRT as of 04/25/21.  Appointments cancelled in [place]. Status changed to inactive in [place] and return kit mailed to patient.

## 2023-06-03 ENCOUNTER — Other Ambulatory Visit: Payer: Self-pay

## 2023-06-03 ENCOUNTER — Encounter (HOSPITAL_BASED_OUTPATIENT_CLINIC_OR_DEPARTMENT_OTHER): Payer: Self-pay

## 2023-06-03 ENCOUNTER — Emergency Department (HOSPITAL_BASED_OUTPATIENT_CLINIC_OR_DEPARTMENT_OTHER): Payer: Medicare HMO

## 2023-06-03 ENCOUNTER — Inpatient Hospital Stay (HOSPITAL_BASED_OUTPATIENT_CLINIC_OR_DEPARTMENT_OTHER)
Admission: EM | Admit: 2023-06-03 | Discharge: 2023-06-06 | DRG: 682 | Disposition: A | Discharge status: Home / Self Care | Payer: Medicare HMO | Attending: Student | Admitting: Student

## 2023-06-03 DIAGNOSIS — Z79620 Long term (current) use of immunosuppressive biologic: Secondary | ICD-10-CM

## 2023-06-03 DIAGNOSIS — C9000 Multiple myeloma not having achieved remission: Secondary | ICD-10-CM | POA: Diagnosis present

## 2023-06-03 DIAGNOSIS — R509 Fever, unspecified: Secondary | ICD-10-CM

## 2023-06-03 DIAGNOSIS — R7881 Bacteremia: Secondary | ICD-10-CM | POA: Diagnosis present

## 2023-06-03 DIAGNOSIS — N1831 Chronic kidney disease, stage 3a: Secondary | ICD-10-CM | POA: Diagnosis present

## 2023-06-03 DIAGNOSIS — N179 Acute kidney failure, unspecified: Secondary | ICD-10-CM | POA: Diagnosis not present

## 2023-06-03 DIAGNOSIS — G9341 Metabolic encephalopathy: Secondary | ICD-10-CM | POA: Diagnosis present

## 2023-06-03 DIAGNOSIS — N3000 Acute cystitis without hematuria: Secondary | ICD-10-CM | POA: Diagnosis present

## 2023-06-03 DIAGNOSIS — G9340 Encephalopathy, unspecified: Principal | ICD-10-CM

## 2023-06-03 DIAGNOSIS — Z1152 Encounter for screening for COVID-19: Secondary | ICD-10-CM

## 2023-06-03 DIAGNOSIS — I10 Essential (primary) hypertension: Secondary | ICD-10-CM | POA: Diagnosis present

## 2023-06-03 DIAGNOSIS — Z888 Allergy status to other drugs, medicaments and biological substances status: Secondary | ICD-10-CM

## 2023-06-03 DIAGNOSIS — B964 Proteus (mirabilis) (morganii) as the cause of diseases classified elsewhere: Secondary | ICD-10-CM | POA: Diagnosis present

## 2023-06-03 DIAGNOSIS — N189 Chronic kidney disease, unspecified: Secondary | ICD-10-CM

## 2023-06-03 DIAGNOSIS — N400 Enlarged prostate without lower urinary tract symptoms: Secondary | ICD-10-CM | POA: Diagnosis present

## 2023-06-03 DIAGNOSIS — Z86011 Personal history of benign neoplasm of the brain: Secondary | ICD-10-CM

## 2023-06-03 DIAGNOSIS — N390 Urinary tract infection, site not specified: Secondary | ICD-10-CM

## 2023-06-03 DIAGNOSIS — M1990 Unspecified osteoarthritis, unspecified site: Secondary | ICD-10-CM | POA: Diagnosis present

## 2023-06-03 DIAGNOSIS — Z833 Family history of diabetes mellitus: Secondary | ICD-10-CM

## 2023-06-03 DIAGNOSIS — Z6830 Body mass index (BMI) 30.0-30.9, adult: Secondary | ICD-10-CM

## 2023-06-03 DIAGNOSIS — Z7982 Long term (current) use of aspirin: Secondary | ICD-10-CM

## 2023-06-03 DIAGNOSIS — F0390 Unspecified dementia without behavioral disturbance: Secondary | ICD-10-CM | POA: Insufficient documentation

## 2023-06-03 DIAGNOSIS — I129 Hypertensive chronic kidney disease with stage 1 through stage 4 chronic kidney disease, or unspecified chronic kidney disease: Secondary | ICD-10-CM | POA: Diagnosis present

## 2023-06-03 DIAGNOSIS — E669 Obesity, unspecified: Secondary | ICD-10-CM | POA: Diagnosis present

## 2023-06-03 DIAGNOSIS — E1122 Type 2 diabetes mellitus with diabetic chronic kidney disease: Secondary | ICD-10-CM | POA: Diagnosis present

## 2023-06-03 DIAGNOSIS — E785 Hyperlipidemia, unspecified: Secondary | ICD-10-CM | POA: Diagnosis present

## 2023-06-03 DIAGNOSIS — F03918 Unspecified dementia, unspecified severity, with other behavioral disturbance: Secondary | ICD-10-CM | POA: Diagnosis present

## 2023-06-03 LAB — CBC WITH DIFFERENTIAL/PLATELET
Abs Immature Granulocytes: 0.03 10*3/uL (ref 0.00–0.07)
Basophils Absolute: 0 10*3/uL (ref 0.0–0.1)
Basophils Relative: 0 %
Eosinophils Absolute: 0 10*3/uL (ref 0.0–0.5)
Eosinophils Relative: 0 %
HCT: 34.7 % — ABNORMAL LOW (ref 39.0–52.0)
Hemoglobin: 11 g/dL — ABNORMAL LOW (ref 13.0–17.0)
Immature Granulocytes: 1 %
Lymphocytes Relative: 15 %
Lymphs Abs: 0.8 10*3/uL (ref 0.7–4.0)
MCH: 30.4 pg (ref 26.0–34.0)
MCHC: 31.7 g/dL (ref 30.0–36.0)
MCV: 95.9 fL (ref 80.0–100.0)
Monocytes Absolute: 0.3 10*3/uL (ref 0.1–1.0)
Monocytes Relative: 6 %
Neutro Abs: 4 10*3/uL (ref 1.7–7.7)
Neutrophils Relative %: 78 %
Platelets: 231 10*3/uL (ref 150–400)
RBC: 3.62 MIL/uL — ABNORMAL LOW (ref 4.22–5.81)
RDW: 16 % — ABNORMAL HIGH (ref 11.5–15.5)
WBC: 5.2 10*3/uL (ref 4.0–10.5)
nRBC: 0 % (ref 0.0–0.2)

## 2023-06-03 LAB — URINALYSIS, W/ REFLEX TO CULTURE (INFECTION SUSPECTED)
Bilirubin Urine: NEGATIVE
Glucose, UA: NEGATIVE mg/dL
Ketones, ur: NEGATIVE mg/dL
Nitrite: NEGATIVE
Protein, ur: 100 mg/dL — AB
RBC / HPF: >50 RBC/hpf (ref 0–5)
Specific Gravity, Urine: 1.02 (ref 1.005–1.030)
WBC, UA: >50 WBC/hpf (ref 0–5)
pH: 8.5 — ABNORMAL HIGH (ref 5.0–8.0)

## 2023-06-03 LAB — PROTIME-INR
INR: 1.1 (ref 0.8–1.2)
Prothrombin Time: 13.9 seconds (ref 11.4–15.2)

## 2023-06-03 LAB — RESP PANEL BY RT-PCR (RSV, FLU A&B, COVID)  RVPGX2
Influenza A by PCR: NEGATIVE
Influenza B by PCR: NEGATIVE
Resp Syncytial Virus by PCR: NEGATIVE
SARS Coronavirus 2 by RT PCR: NEGATIVE

## 2023-06-03 LAB — LACTIC ACID, PLASMA: Lactic Acid, Venous: 1.2 mmol/L (ref 0.5–1.9)

## 2023-06-03 LAB — APTT: aPTT: 24 seconds (ref 24–36)

## 2023-06-03 LAB — COMPREHENSIVE METABOLIC PANEL
ALT: 13 U/L (ref 0–44)
AST: 23 U/L (ref 15–41)
Albumin: 3.7 g/dL (ref 3.5–5.0)
Alkaline Phosphatase: 51 U/L (ref 38–126)
Anion gap: 9 (ref 5–15)
BUN: 25 mg/dL — ABNORMAL HIGH (ref 8–23)
CO2: 25 mmol/L (ref 22–32)
Calcium: 8.7 mg/dL — ABNORMAL LOW (ref 8.9–10.3)
Chloride: 105 mmol/L (ref 98–111)
Creatinine, Ser: 2.16 mg/dL — ABNORMAL HIGH (ref 0.61–1.24)
GFR, Estimated: 30 mL/min — ABNORMAL LOW (ref >60)
Glucose, Bld: 113 mg/dL — ABNORMAL HIGH (ref 70–99)
Potassium: 4.6 mmol/L (ref 3.5–5.1)
Sodium: 139 mmol/L (ref 135–145)
Total Bilirubin: 0.5 mg/dL (ref 0.3–1.2)
Total Protein: 8 g/dL (ref 6.5–8.1)

## 2023-06-03 MED ORDER — SODIUM CHLORIDE 0.9 % IV SOLN
2.0000 g | Freq: Once | INTRAVENOUS | Status: AC
  Administered 2023-06-04: 2 g via INTRAVENOUS
  Filled 2023-06-03: qty 20

## 2023-06-03 MED ORDER — ACETAMINOPHEN 500 MG PO TABS: 1000.0000 mg | ORAL_TABLET | Freq: Once | ORAL | Status: DC

## 2023-06-03 MED ORDER — ONDANSETRON HCL 4 MG/2ML IJ SOLN
4.0000 mg | Freq: Once | INTRAMUSCULAR | Status: AC
  Administered 2023-06-03: 4 mg via INTRAVENOUS
  Filled 2023-06-03: qty 2

## 2023-06-03 MED ORDER — ACETAMINOPHEN 650 MG RE SUPP
650.0000 mg | Freq: Once | RECTAL | Status: AC
  Administered 2023-06-04: 650 mg via RECTAL
  Filled 2023-06-03: qty 1

## 2023-06-03 NOTE — ED Triage Notes (Addendum)
Pt arrives with c/o n/v that started this evening. Per wife, pts BP was high at home. Pt ha hx of HTN. No missed dose of BP meds. Pt denies fevers. Pt is sleepy in triage. Pt denies ABD pain or urinary symptoms.

## 2023-06-03 NOTE — ED Provider Notes (Signed)
Anthony Walls AT [facility] [place] Provider Note   CSN: [id number] Arrival date & time: 06/03/23  2108     History {Add pertinent medical, surgical, social history, OB history to HPI:1} Chief Complaint  Patient presents with   Emesis    Anthony Walls is a 82 y.o. male.  HPI     Home Medications Prior to Admission medications   Medication Sig Start Date End Date Taking? Authorizing Provider  acetaminophen (TYLENOL) 325 MG tablet Take 1-2 tablets (325-650 mg total) by mouth every 4 (four) hours as needed. Patient taking differently: Take 325-650 mg by mouth every 4 (four) hours as needed for mild pain.  04/04/13   Love, Evlyn Kanner, PA-C  aspirin EC 81 MG tablet Take 81 mg by mouth daily.     [provider]  calcium-vitamin D (OSCAL WITH D) 500-200 MG-UNIT tablet Take 1 tablet by mouth 2 (two) times daily.    [provider]  lenalidomide (REVLIMID) 5 MG capsule Take 5 mg by mouth See admin instructions. Take cyclically on 2 weeks, and off 1 week (on 14 days, off 7 days)    [provider]  PARoxetine (PAXIL) 20 MG tablet Take 20 mg by mouth every evening.     [provider]  psyllium (METAMUCIL) 58.6 % powder Take 1 packet by mouth daily as needed (fiber).     [provider]      Allergies    Iron polysacch [id number] and Statins    Review of Systems   Review of Systems  Physical Exam Updated Vital Signs BP (!) 157/74   Pulse 75   Temp (!) 102.5 F (39.2 C) (Oral)   Resp (!) 25   Ht 5\' 9"  (1.753 m)   Wt 93 kg   SpO2 93%   BMI 30.27 kg/m  Physical Exam  ED Results / Procedures / Treatments   Labs (all labs ordered are listed, but only abnormal results are displayed) Labs Reviewed  COMPREHENSIVE METABOLIC PANEL - Abnormal; Notable for the following components:      Result Value   Glucose, Bld 113 (*)    BUN 25 (*)    Creatinine, Ser 2.16 (*)    Calcium 8.7 (*)    GFR, Estimated 30 (*)     All other components within normal limits  CBC WITH DIFFERENTIAL/PLATELET - Abnormal; Notable for the following components:   RBC 3.62 (*)    Hemoglobin 11.0 (*)    HCT 34.7 (*)    RDW 16.0 (*)    All other components within normal limits  URINALYSIS, W/ REFLEX TO CULTURE (INFECTION SUSPECTED) - Abnormal; Notable for the following components:   APPearance CLOUDY (*)    pH 8.5 (*)    Hgb urine dipstick LARGE (*)    Protein, ur 100 (*)    Leukocytes,Ua MODERATE (*)    Bacteria, UA MANY (*)    All other components within normal limits  RESP PANEL BY RT-PCR (RSV, FLU A&B, COVID)  RVPGX2  CULTURE, BLOOD (ROUTINE X 2)  CULTURE, BLOOD (ROUTINE X 2)  LACTIC ACID, PLASMA  PROTIME-INR  APTT  LACTIC ACID, PLASMA    EKG None  Radiology CT Head Wo Contrast  Result Date: 06/03/2023 CLINICAL DATA:  Altered mental status EXAM: CT HEAD WITHOUT CONTRAST TECHNIQUE: Contiguous axial images were obtained from the base of the skull through the vertex without intravenous contrast. RADIATION DOSE REDUCTION: This exam was performed according to the departmental dose-optimization program  which includes automated exposure control, adjustment of the mA and/or kV according to patient size and/or use of iterative reconstruction technique. COMPARISON:  None Available. FINDINGS: Brain: No evidence of acute infarction, hemorrhage, hydrocephalus, extra-axial collection or mass lesion/mass effect. Encephalomalacia changes are noted in the left cerebellar hemisphere consistent with prior surgery. Chronic atrophic changes and ischemic changes are seen. Vascular: No hyperdense vessel or unexpected calcification. Skull: Occipital craniectomy is noted in the midline. Ines Bloomer hole is noted in the posterior right parietal region. Sinuses/Orbits: Chronic opacification of the sphenoid sinus is noted on the right with increased sclerosis. Other: None IMPRESSION: Chronic atrophic and ischemic changes. Postoperative changes in the left  cerebellar hemisphere. Electronically Signed   By: Alcide Clever M.D.   On: 06/03/2023 22:35   DG Chest Port 1 View  Result Date: 06/03/2023 CLINICAL DATA:  Fever, nausea, vomiting EXAM: PORTABLE CHEST 1 VIEW COMPARISON:  06/10/2017 FINDINGS: Lungs are clear. No pneumothorax or pleural effusion. Cardiac size within normal limits. Pulmonary vascularity is normal. Implanted loop recorder noted. Thoracolumbar fusion hardware partially visualized. No acute bone abnormality. IMPRESSION: 1. No active disease. Electronically Signed   By: Helyn Numbers M.D.   On: 06/03/2023 22:32    Procedures Procedures  {Document cardiac monitor, telemetry assessment procedure when appropriate:1}  Medications Ordered in ED Medications  cefTRIAXone (ROCEPHIN) 2 g in sodium chloride 0.9 % 100 mL IVPB (has no administration in time range)  acetaminophen (TYLENOL) tablet 1,000 mg (has no administration in time range)  ondansetron (ZOFRAN) injection 4 mg (4 mg Intravenous Given 06/03/23 2252)    ED Course/ Medical Decision Making/ A&P   {   Click here for ABCD2, HEART and other calculatorsREFRESH Note before signing :1}                          Medical Decision Making Amount and/or Complexity of Data Reviewed Labs: ordered. Radiology: ordered. ECG/medicine tests: ordered.  Risk OTC drugs. Prescription drug management.   ***  {Document critical care time when appropriate:1} {Document review of labs and clinical decision tools ie heart score, Chads2Vasc2 etc:1}  {Document your independent review of radiology images, and any outside records:1} {Document your discussion with family members, caretakers, and with consultants:1} {Document social determinants of health affecting pt's care:1} {Document your decision making why or why not admission, treatments were needed:1} Final Clinical Impression(s) / ED Diagnoses Final diagnoses:  None    Rx / DC Orders ED Discharge Orders     None

## 2023-06-04 ENCOUNTER — Encounter (HOSPITAL_COMMUNITY): Payer: Self-pay | Admitting: Family Medicine

## 2023-06-04 ENCOUNTER — Inpatient Hospital Stay (HOSPITAL_COMMUNITY): Payer: Medicare HMO

## 2023-06-04 DIAGNOSIS — F03918 Unspecified dementia, unspecified severity, with other behavioral disturbance: Secondary | ICD-10-CM | POA: Diagnosis present

## 2023-06-04 DIAGNOSIS — M1990 Unspecified osteoarthritis, unspecified site: Secondary | ICD-10-CM | POA: Diagnosis present

## 2023-06-04 DIAGNOSIS — E1122 Type 2 diabetes mellitus with diabetic chronic kidney disease: Secondary | ICD-10-CM | POA: Diagnosis present

## 2023-06-04 DIAGNOSIS — Z833 Family history of diabetes mellitus: Secondary | ICD-10-CM | POA: Diagnosis not present

## 2023-06-04 DIAGNOSIS — E785 Hyperlipidemia, unspecified: Secondary | ICD-10-CM | POA: Diagnosis present

## 2023-06-04 DIAGNOSIS — E669 Obesity, unspecified: Secondary | ICD-10-CM | POA: Insufficient documentation

## 2023-06-04 DIAGNOSIS — Z79620 Long term (current) use of immunosuppressive biologic: Secondary | ICD-10-CM | POA: Diagnosis not present

## 2023-06-04 DIAGNOSIS — F0390 Unspecified dementia without behavioral disturbance: Secondary | ICD-10-CM | POA: Insufficient documentation

## 2023-06-04 DIAGNOSIS — N179 Acute kidney failure, unspecified: Secondary | ICD-10-CM

## 2023-06-04 DIAGNOSIS — N3000 Acute cystitis without hematuria: Secondary | ICD-10-CM

## 2023-06-04 DIAGNOSIS — B964 Proteus (mirabilis) (morganii) as the cause of diseases classified elsewhere: Secondary | ICD-10-CM | POA: Diagnosis present

## 2023-06-04 DIAGNOSIS — C9000 Multiple myeloma not having achieved remission: Secondary | ICD-10-CM | POA: Diagnosis present

## 2023-06-04 DIAGNOSIS — G9340 Encephalopathy, unspecified: Secondary | ICD-10-CM | POA: Diagnosis not present

## 2023-06-04 DIAGNOSIS — N400 Enlarged prostate without lower urinary tract symptoms: Secondary | ICD-10-CM | POA: Diagnosis present

## 2023-06-04 DIAGNOSIS — R509 Fever, unspecified: Secondary | ICD-10-CM

## 2023-06-04 DIAGNOSIS — R7881 Bacteremia: Secondary | ICD-10-CM | POA: Diagnosis present

## 2023-06-04 DIAGNOSIS — N390 Urinary tract infection, site not specified: Secondary | ICD-10-CM | POA: Diagnosis present

## 2023-06-04 DIAGNOSIS — G9341 Metabolic encephalopathy: Secondary | ICD-10-CM | POA: Insufficient documentation

## 2023-06-04 DIAGNOSIS — I10 Essential (primary) hypertension: Secondary | ICD-10-CM

## 2023-06-04 DIAGNOSIS — I129 Hypertensive chronic kidney disease with stage 1 through stage 4 chronic kidney disease, or unspecified chronic kidney disease: Secondary | ICD-10-CM | POA: Diagnosis present

## 2023-06-04 DIAGNOSIS — N1831 Chronic kidney disease, stage 3a: Secondary | ICD-10-CM | POA: Diagnosis present

## 2023-06-04 DIAGNOSIS — N189 Chronic kidney disease, unspecified: Secondary | ICD-10-CM

## 2023-06-04 DIAGNOSIS — Z7982 Long term (current) use of aspirin: Secondary | ICD-10-CM | POA: Diagnosis not present

## 2023-06-04 DIAGNOSIS — Z888 Allergy status to other drugs, medicaments and biological substances status: Secondary | ICD-10-CM | POA: Diagnosis not present

## 2023-06-04 DIAGNOSIS — Z1152 Encounter for screening for COVID-19: Secondary | ICD-10-CM | POA: Diagnosis not present

## 2023-06-04 DIAGNOSIS — Z86011 Personal history of benign neoplasm of the brain: Secondary | ICD-10-CM | POA: Diagnosis not present

## 2023-06-04 DIAGNOSIS — Z6830 Body mass index (BMI) 30.0-30.9, adult: Secondary | ICD-10-CM | POA: Diagnosis not present

## 2023-06-04 LAB — CBC WITH DIFFERENTIAL/PLATELET
Abs Immature Granulocytes: 0.15 10*3/uL — ABNORMAL HIGH (ref 0.00–0.07)
Basophils Absolute: 0 10*3/uL (ref 0.0–0.1)
Basophils Relative: 0 %
Eosinophils Absolute: 0 10*3/uL (ref 0.0–0.5)
Eosinophils Relative: 0 %
HCT: 32.2 % — ABNORMAL LOW (ref 39.0–52.0)
Hemoglobin: 10.4 g/dL — ABNORMAL LOW (ref 13.0–17.0)
Immature Granulocytes: 2 %
Lymphocytes Relative: 7 %
Lymphs Abs: 0.7 10*3/uL (ref 0.7–4.0)
MCH: 31 pg (ref 26.0–34.0)
MCHC: 32.3 g/dL (ref 30.0–36.0)
MCV: 95.8 fL (ref 80.0–100.0)
Monocytes Absolute: 1.1 10*3/uL — ABNORMAL HIGH (ref 0.1–1.0)
Monocytes Relative: 11 %
Neutro Abs: 8 10*3/uL — ABNORMAL HIGH (ref 1.7–7.7)
Neutrophils Relative %: 80 %
Platelets: 237 10*3/uL (ref 150–400)
RBC: 3.36 MIL/uL — ABNORMAL LOW (ref 4.22–5.81)
RDW: 15.9 % — ABNORMAL HIGH (ref 11.5–15.5)
WBC: 10 10*3/uL (ref 4.0–10.5)
nRBC: 0 % (ref 0.0–0.2)

## 2023-06-04 LAB — BLOOD CULTURE ID PANEL (REFLEXED) - BCID2

## 2023-06-04 LAB — BASIC METABOLIC PANEL
Anion gap: 14 (ref 5–15)
BUN: 26 mg/dL — ABNORMAL HIGH (ref 8–23)
CO2: 22 mmol/L (ref 22–32)
Calcium: 8.9 mg/dL (ref 8.9–10.3)
Chloride: 103 mmol/L (ref 98–111)
Creatinine, Ser: 2.23 mg/dL — ABNORMAL HIGH (ref 0.61–1.24)
GFR, Estimated: 29 mL/min — ABNORMAL LOW (ref >60)
Glucose, Bld: 129 mg/dL — ABNORMAL HIGH (ref 70–99)
Potassium: 4.8 mmol/L (ref 3.5–5.1)
Sodium: 139 mmol/L (ref 135–145)

## 2023-06-04 LAB — CULTURE, BLOOD (ROUTINE X 2): Special Requests: ADEQUATE

## 2023-06-04 LAB — LACTIC ACID, PLASMA: Lactic Acid, Venous: 1 mmol/L (ref 0.5–1.9)

## 2023-06-04 MED ORDER — SODIUM CHLORIDE 0.9 % IV BOLUS
500.0000 mL | Freq: Once | INTRAVENOUS | Status: AC
  Administered 2023-06-04: 500 mL via INTRAVENOUS

## 2023-06-04 MED ORDER — SODIUM CHLORIDE 0.9 % IV SOLN
2.0000 g | INTRAVENOUS | Status: DC
  Administered 2023-06-04 – 2023-06-05 (×2): 2 g via INTRAVENOUS
  Filled 2023-06-04 (×2): qty 20

## 2023-06-04 MED ORDER — HALOPERIDOL LACTATE 5 MG/ML IJ SOLN
3.0000 mg | Freq: Once | INTRAMUSCULAR | Status: AC | PRN
  Administered 2023-06-06: 3 mg via INTRAVENOUS
  Filled 2023-06-04 (×2): qty 1

## 2023-06-04 MED ORDER — ONDANSETRON HCL 4 MG PO TABS: 4.0000 mg | ORAL_TABLET | Freq: Four times a day (QID) | ORAL | Status: DC | PRN

## 2023-06-04 MED ORDER — ENSURE ENLIVE PO LIQD
237.0000 mL | Freq: Two times a day (BID) | ORAL | Status: DC
  Administered 2023-06-04 – 2023-06-06 (×5): 237 mL via ORAL

## 2023-06-04 MED ORDER — ONDANSETRON HCL 4 MG/2ML IJ SOLN: 4.0000 mg | Freq: Four times a day (QID) | INTRAMUSCULAR | Status: DC | PRN

## 2023-06-04 MED ORDER — SENNOSIDES-DOCUSATE SODIUM 8.6-50 MG PO TABS: 1.0000 | ORAL_TABLET | Freq: Every evening | ORAL | Status: DC | PRN

## 2023-06-04 MED ORDER — HEPARIN SODIUM (PORCINE) 5000 UNIT/ML IJ SOLN
5000.0000 [IU] | Freq: Three times a day (TID) | INTRAMUSCULAR | Status: DC
  Administered 2023-06-04 – 2023-06-06 (×7): 5000 [IU] via SUBCUTANEOUS
  Filled 2023-06-04 (×7): qty 1

## 2023-06-04 MED ORDER — ACETAMINOPHEN 650 MG RE SUPP: 650.0000 mg | Freq: Four times a day (QID) | RECTAL | Status: DC | PRN

## 2023-06-04 MED ORDER — HYDRALAZINE HCL 20 MG/ML IJ SOLN: 5.0000 mg | INTRAMUSCULAR | Status: DC | PRN

## 2023-06-04 MED ORDER — ACETAMINOPHEN 325 MG PO TABS
650.0000 mg | ORAL_TABLET | Freq: Four times a day (QID) | ORAL | Status: DC | PRN
  Administered 2023-06-04: 650 mg via ORAL
  Filled 2023-06-04: qty 2

## 2023-06-04 MED ORDER — SODIUM CHLORIDE 0.9 % IV SOLN: INTRAVENOUS | Status: DC

## 2023-06-04 MED ORDER — SODIUM CHLORIDE 0.9 % IV SOLN: 1.0000 g | INTRAVENOUS | Status: DC

## 2023-06-04 MED ORDER — LACTATED RINGERS IV BOLUS
1000.0000 mL | Freq: Once | INTRAVENOUS | Status: AC
  Administered 2023-06-04: 1000 mL via INTRAVENOUS

## 2023-06-04 MED ORDER — ACETAMINOPHEN 325 MG PO TABS
650.0000 mg | ORAL_TABLET | Freq: Once | ORAL | Status: AC
  Administered 2023-06-04: 650 mg via ORAL
  Filled 2023-06-04: qty 2

## 2023-06-04 NOTE — Hospital Course (Signed)
Mr. Bacino is an 82 y.o. 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

## 2023-06-04 NOTE — Assessment & Plan Note (Signed)
BMI 30.2 

## 2023-06-04 NOTE — TOC Initial Note (Signed)
Transition of Care [facility]) - Initial/Assessment Note    Patient Details  Name: Anthony Walls MRN: [id number] Date of Birth: 01-28-1941  Transition of Care [facility]) CM/SW Contact:    Janae Bridgeman, RN Phone Number: 06/04/2023, 3:21 PM  Clinical Narrative:                 CM met with the patient, daughter and wife at the bedside to discuss TOC needs.  The patient was admitted for septicemia.  The patient lives with his wife at the home and wears depends for urinary incontinence all day.  The daughter states that the patient does not remove the depends after her urinates and stuffs paper in the diapers during the day.  The daughter states that she feels that the diapers are contributing to the cause for the UTI.  The patient has "alzheimer's disease" and does not use his DME at the home including [place] and RW.    Patient remains on oxygen in the hospital at this time and is currently pending PT/OT evaluation.  No home health services are present in the home at this time.  CM will continue to follow the patient for TOC needs.  Medicare Observation letter was provided to the patient's family at the bedside.  Expected Discharge Plan: Home w Home Health Services Barriers to Discharge: Continued Medical Work up   Patient Goals and CMS Choice Patient states their goals for this hospitalization and ongoing recovery are:: To get better CMS Medicare.gov Compare Post Acute Care list provided to:: Patient Choice offered to / list presented to : Patient Enders ownership interest in [facility].provided to:: Patient    Expected Discharge Plan and Services   Discharge Planning Services: CM Consult   Living arrangements for the past 2 months: Single Family Home                                      Prior Living Arrangements/Services Living arrangements for the past 2 months: Single Family Home Lives with:: Spouse Patient language and need for interpreter reviewed::  Yes Do you feel safe going back to the place where you live?: Yes      Need for Family Participation in Patient Care: Yes (Comment)   Current home services: DME (Patient has [place], [place], Adult depends for urinary incontinence) Criminal Activity/Legal Involvement Pertinent to Current Situation/Hospitalization: No - Comment as needed  Activities of Daily Living Home Assistive Devices/Equipment: None ADL Screening (condition at time of admission) Patient's cognitive ability adequate to safely complete daily activities?: Yes Is the patient deaf or have difficulty hearing?: No Does the patient have difficulty seeing, even when wearing glasses/contacts?: No Does the patient have difficulty concentrating, remembering, or making decisions?: Yes Patient able to express need for assistance with ADLs?: No Does the patient have difficulty dressing or bathing?: No Independently performs ADLs?: Yes (appropriate for developmental age) Does the patient have difficulty walking or climbing stairs?: Yes Weakness of Legs: None Weakness of Arms/Hands: None  Permission Sought/Granted Permission sought to share information with : Case Manager, Family Supports          Permission granted to share info w Relationship: spouse     Emotional Assessment Appearance:: Appears stated age Attitude/Demeanor/Rapport: Gracious Affect (typically observed): Accepting Orientation: : Oriented to Self, Oriented to Place Alcohol / Substance Use: Not Applicable Psych Involvement: No (comment)  Admission diagnosis:  UTI (urinary tract  infection) [N39.0] Encephalopathy [G93.40] AKI (acute kidney injury) (HCC) [N17.9] Fever, unspecified fever cause [R50.9] Urinary tract infection without hematuria, site unspecified [N39.0] Patient Active Problem List   Diagnosis Date Noted   Acute cystitis 06/04/2023   Acute kidney injury superimposed on chronic kidney disease stage IIIa 06/04/2023   Obesity (BMI 30-39.9) 06/04/2023    Acute metabolic encephalopathy 06/04/2023   Dementia without behavioral disturbance (HCC) 06/04/2023   Multiple myeloma (HCC) 06/04/2023   Bradycardia 06/17/2017   HTN (hypertension) 03/26/2013   PCP:  Cheral [place], MD Pharmacy:   [id number] - [place], [place] - [address] [place] [address] Phone: [phone] Fax: [phone]     Social Determinants of Health (SDOH) Social History: SDOH Screenings   Food Insecurity: No Food Insecurity (06/04/2023)  Housing: Low Risk  (06/04/2023)  Transportation Needs: No Transportation Needs (06/04/2023)  Utilities: Not At Risk (06/04/2023)  Tobacco Use: Low Risk  (06/04/2023)   SDOH Interventions:     Readmission Risk Interventions    06/04/2023    3:21 PM  Readmission Risk Prevention Plan  Transportation Screening Complete  PCP or Specialist Appt within 5-7 Days Complete  Home Care Screening Complete  Medication Review (RN CM) Complete

## 2023-06-04 NOTE — Progress Notes (Signed)
    Patient: Anthony Walls EAV:[id number] DOB: 10-01-1941      Brief hospital course: [name] is an 82 y.o. 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    This is a no charge note, for further details, please see the H&P by my partner, Dr. Joneen Roach from earlier today.   Principal Problem:   Acute cystitis Active Problems:   HTN (hypertension)   Acute kidney injury superimposed on chronic kidney disease stage IIIa   Obesity (BMI 30-39.9)   Acute metabolic encephalopathy   Dementia without behavioral disturbance (HCC)   Multiple myeloma (HCC)    Admitted for UTI Renal US shows no obstruction Add IV fluids Continue antibiotics        Physical Exam: BP 130/67 (BP Location: Right Arm)   Pulse 63   Temp 99.5 F (37.5 C) (Oral)   Resp 18   Ht 5\' 9"  (1.753 m)   Wt 92.8 kg   SpO2 100%   BMI 30.21 kg/m   Patient seen and examined. Somnolent but wakes up and talks.  Is somewhat confused.    Family Communication: Wife and daughter        Author: Alberteen Sam, MD 06/04/2023 2:45 PM

## 2023-06-04 NOTE — Care Management Obs Status (Cosign Needed)
MEDICARE OBSERVATION STATUS NOTIFICATION   Patient Details  Name: Alvie Mazzocchi MRN: [id number] Date of Birth: 1941/02/23   Medicare Observation Status Notification Given:  Yes    Janae Bridgeman, RN 06/04/2023, 3:10 PM

## 2023-06-04 NOTE — Assessment & Plan Note (Signed)
Baseline Cr 1.3 just a few weeks ago with Oncologist.  Here is 2.23 on admission.  Improved to 2.0 today, [place] negative for obstruction.    - Continue IV fluids

## 2023-06-04 NOTE — ED Notes (Signed)
While pt was sleeping his O2 dropped to 81%.  Pt placed on 2L [place] which increased his O2 to 100%.  RN will continue to monitor.

## 2023-06-04 NOTE — ED Notes (Signed)
[facility] called for transport. 

## 2023-06-04 NOTE — Progress Notes (Signed)
Plan of Care Note for accepted transfer   Patient: Anthony Walls MRN: [id number]   DOA: 06/03/2023  Facility requesting transfer: [facility]   Requesting Provider: Dr. Dalene Seltzer   Reason for transfer: [facility]   Facility course: 82 yr old man with hx of HTN, CKD 3A, and multiple myeloma who presents with somnolence, confusion, and N/V that developed today.   He is febrile with stable BP and normal WBC and lactate. UA compatible with infection. SCr is 2.15 (1.35 on June 5th).   He is being cultured and treated with IVF and Rocephin.   Plan of care: The patient is accepted for admission to Telemetry unit, at [facility].   Author: Briscoe Deutscher, MD 06/04/2023  Check [email or web address] for on-call coverage.  Nursing staff, Please call [facility] Admits & Consults System-Wide number on Amion as soon as patient's arrival, so appropriate admitting provider can evaluate the pt.

## 2023-06-04 NOTE — Progress Notes (Signed)
PHARMACY - PHYSICIAN COMMUNICATION CRITICAL VALUE ALERT - BLOOD CULTURE IDENTIFICATION (BCID)  Anthony Walls is an 82 y.o. male who presented to [facility] on 06/03/2023  Assessment:  50 yom presenting with UTI and initiated on ceftriaxone, now with 2/3 blood culture bottles positive for proteus with no resistance detected.  Name of physician (or Provider) Contacted: Opyd, T [facility])  Current antibiotics: ceftriaxone 1g IV q24h  Changes to prescribed antibiotics recommended:  Adjust ceftriaxone to 2g IV q24h for bacteremia dosing  Results for orders placed or performed during the hospital encounter of 06/03/23  Blood Culture ID Panel (Reflexed) (Collected: 06/03/2023  9:51 PM)  Result Value Ref Range   Enterococcus faecalis NOT DETECTED NOT DETECTED   Enterococcus Faecium NOT DETECTED NOT DETECTED   Listeria monocytogenes NOT DETECTED NOT DETECTED   Staphylococcus species NOT DETECTED NOT DETECTED   Staphylococcus aureus (BCID) NOT DETECTED NOT DETECTED   Staphylococcus epidermidis NOT DETECTED NOT DETECTED   Staphylococcus lugdunensis NOT DETECTED NOT DETECTED   Streptococcus species NOT DETECTED NOT DETECTED   Streptococcus agalactiae NOT DETECTED NOT DETECTED   Streptococcus pneumoniae NOT DETECTED NOT DETECTED   Streptococcus pyogenes NOT DETECTED NOT DETECTED   A.calcoaceticus-baumannii NOT DETECTED NOT DETECTED   Bacteroides fragilis NOT DETECTED NOT DETECTED   Enterobacterales DETECTED (A) NOT DETECTED   Enterobacter cloacae complex NOT DETECTED NOT DETECTED   Escherichia coli NOT DETECTED NOT DETECTED   Klebsiella aerogenes NOT DETECTED NOT DETECTED   Klebsiella oxytoca NOT DETECTED NOT DETECTED   Klebsiella pneumoniae NOT DETECTED NOT DETECTED   Proteus species DETECTED (A) NOT DETECTED   Salmonella species NOT DETECTED NOT DETECTED   Serratia marcescens NOT DETECTED NOT DETECTED   Haemophilus influenzae NOT DETECTED NOT DETECTED   Neisseria meningitidis NOT DETECTED  NOT DETECTED   Pseudomonas aeruginosa NOT DETECTED NOT DETECTED   Stenotrophomonas maltophilia NOT DETECTED NOT DETECTED   Candida albicans NOT DETECTED NOT DETECTED   Candida auris NOT DETECTED NOT DETECTED   Candida glabrata NOT DETECTED NOT DETECTED   Candida krusei NOT DETECTED NOT DETECTED   Candida parapsilosis NOT DETECTED NOT DETECTED   Candida tropicalis NOT DETECTED NOT DETECTED   Cryptococcus neoformans/gattii NOT DETECTED NOT DETECTED   CTX-M ESBL NOT DETECTED NOT DETECTED   Carbapenem resistance IMP NOT DETECTED NOT DETECTED   Carbapenem resistance KPC NOT DETECTED NOT DETECTED   Carbapenem resistance NDM NOT DETECTED NOT DETECTED   Carbapenem resist OXA 48 LIKE NOT DETECTED NOT DETECTED   Carbapenem resistance VIM NOT DETECTED NOT DETECTED     Leia Alf, PharmD, BCPS Please check AMION for all [facility] contact numbers Clinical Pharmacist 06/04/2023 8:26 PM

## 2023-06-04 NOTE — Evaluation (Signed)
Physical Therapy Evaluation Patient Details Name: Anthony Walls MRN: [id number] DOB: 09-24-1941 Today's Date: 06/04/2023  History of Present Illness  82 yo male presents to [facility] with n/v, HTN. Pt found to have acute on chronic kidney injury. PMH includes HTN, multiple myeloma, CKDIII, thoracic laminectomy for tumor 2014, dementia.  Clinical Impression   Pt presents with generalized weakness, impaired balance, and decreased activity tolerance. Pt to benefit from acute PT to address deficits. Pt ambulated short hallway distance with use of intermittent HHA, pt requires occasional steadying assist during mobility. PT anticipates good recovery while acute, no follow up PT needs anticipated.  PT to progress mobility as tolerated, and will continue to follow acutely.          Assistance Recommended at Discharge Set up Supervision/Assistance  If plan is discharge home, recommend the following:  Can travel by private vehicle  A little help with bathing/dressing/bathroom;A little help with walking and/or transfers        Equipment Recommendations None recommended by PT  Recommendations for Other Services       Functional Status Assessment Patient has had a recent decline in their functional status and demonstrates the ability to make significant improvements in function in a reasonable and predictable amount of time.     Precautions / Restrictions Precautions Precautions: Fall Restrictions Weight Bearing Restrictions: No      Mobility  Bed Mobility Overal bed mobility: Needs Assistance Bed Mobility: Supine to Sit     Supine to sit: Min assist, HOB elevated     General bed mobility comments: assist for trunk elevation    Transfers Overall transfer level: Needs assistance Equipment used: 1 person hand held assist Transfers: Sit to/from Stand Sit to Stand: Min assist           General transfer comment: light rise and steady assist    Ambulation/Gait Ambulation/Gait  assistance: Min guard, Min assist Gait Distance (Feet): 300 Feet Assistive device: 1 person hand held assist, None Gait Pattern/deviations: Step-through pattern, Decreased stride length, Trunk flexed Gait velocity: decr     General Gait Details: trunk lean to L, requires cues to correct, min assist initially to steady and transitioning to close guard for safety  Stairs            Wheelchair Mobility     Tilt Bed    Modified Rankin (Stroke Patients Only)       Balance Overall balance assessment: Needs assistance Sitting-balance support: No upper extremity supported, Feet supported Sitting balance-Leahy Scale: Good     Standing balance support: No upper extremity supported, During functional activity Standing balance-Leahy Scale: Fair                               Pertinent Vitals/Pain Pain Assessment Pain Assessment: No/denies pain    Home Living Family/patient expects to be discharged to:: Private residence Living Arrangements: Spouse/significant other Available Help at Discharge: Family;Available 24 hours/day Type of Home: House Home Access: Level entry       Home Layout: One level Home Equipment: Agricultural consultant (2 wheels);Cane - single point      Prior Function Prior Level of Function : Needs assist             Mobility Comments: pt and family report independence with mobility, no falls ADLs Comments: assist for wash up and pericare, pt incontinent and uses "pampers" at home     Hand Dominance   Dominant  Hand: Right    Extremity/Trunk Assessment   Upper Extremity Assessment Upper Extremity Assessment: Defer to OT evaluation    Lower Extremity Assessment Lower Extremity Assessment: Generalized weakness    Cervical / Trunk Assessment Cervical / Trunk Assessment: Normal  Communication   Communication: HOH  Cognition Arousal/Alertness: Awake/alert Behavior During Therapy: WFL for tasks assessed/performed Overall Cognitive  Status: History of cognitive impairments - at baseline                                 General Comments: history of dementia, oriented to self, location, and situation but not time which pt's wife states is normal.        General Comments General comments (skin integrity, edema, etc.): urinary incontinence durign session    Exercises     Assessment/Plan    PT Assessment Patient needs continued PT services  PT Problem List Decreased strength;Decreased mobility;Decreased activity tolerance;Decreased balance;Decreased cognition;Decreased knowledge of use of DME;Pain       PT Treatment Interventions DME instruction;Therapeutic activities;Gait training;Therapeutic exercise;Balance training;Patient/family education;Functional mobility training;Neuromuscular re-education    PT Goals (Current goals can be found in the Care Plan section)  Acute Rehab PT Goals Patient Stated Goal: home PT Goal Formulation: With patient Time For Goal Achievement: 06/18/23 Potential to Achieve Goals: Good    Frequency Min 3X/week     Co-evaluation               AM-PAC PT "6 Clicks" Mobility  Outcome Measure Help needed turning from your back to your side while in a flat bed without using bedrails?: None Help needed moving from lying on your back to sitting on the side of a flat bed without using bedrails?: A Little Help needed moving to and from a bed to a chair (including a wheelchair)?: A Little Help needed standing up from a chair using your arms (e.g., wheelchair or bedside chair)?: A Little Help needed to walk in hospital room?: A Little Help needed climbing 3-5 steps with a railing? : A Little 6 Click Score: 19    End of Session   Activity Tolerance: Patient tolerated treatment well Patient left: in chair;with call bell/phone within reach;with chair alarm set;with family/visitor present Nurse Communication: Mobility status PT Visit Diagnosis: Other abnormalities of gait  and mobility (R26.89);Muscle weakness (generalized) (M62.81)    Time: 1610-9604 PT Time Calculation (min) (ACUTE ONLY): 24 min   Charges:   PT Evaluation $PT Eval Low Complexity: 1 Low   PT General Charges $$ ACUTE PT VISIT: 1 Visit         Anthony Walls, PT DPT Acute Rehabilitation Services Secure Chat Preferred  Office [phone]   Anthony Walls 06/04/2023, 4:32 PM

## 2023-06-04 NOTE — H&P (Signed)
PCP:   Cheral Bay, MD   Chief Complaint:  Nausea, vomiting, altered mentation  HPI: This is a 82 year old male with past medical history of hx of HTN, CKD 3A, and multiple myeloma.  Patient discharged with his wife, he was feeling well.  After church today send by their daughter, he slept the entire visit.  On the way home in the car, he developed significant nausea vomiting and gagging.  His wife had to pull over.    At home his nausea and vomiting returned.  Blood d pressure check revealed a systolic blood pressure greater than 200.  They decided to come to the go to the ER.  Patient is compliant with all his medication  At [facility] patient UA was positive, patient also had acute on chronic kidney injury.  Lactic acid normal.  Patient's Tmax was 102.5.  Vitals otherwise stable  Review of Systems:  Per HPI  Past Medical History: Past Medical History:  Diagnosis Date   Arthritis    Hesitancy    ?BPH   Hypertension    Multiple myeloma (HCC) 2014   Past Surgical History:  Procedure Laterality Date   BRAIN SURGERY  89   tumor   LAMINECTOMY N/A 03/20/2013   Procedure: THORACIC LAMINECTOMY FOR TUMOR;  Surgeon: Reinaldo Meeker, MD;  Location: [facility] NEURO ORS;  Service: Neurosurgery;  Laterality: N/A;  Thoracic Laminectomy for Thoracic Ten Tumor, Pedicle Screws at Thoracic Nine through Eleven   LOOP RECORDER INSERTION N/A 08/02/2017   Procedure: LOOP RECORDER INSERTION;  Surgeon: Regan Lemming, MD;  Location: [facility] INVASIVE CV LAB;  Service: Cardiovascular;  Laterality: N/A;   TRANSURETHRAL RESECTION OF PROSTATE      Medications: Prior to Admission medications   Medication Sig Start Date End Date Taking? Authorizing Provider  acetaminophen (TYLENOL) 325 MG tablet Take 1-2 tablets (325-650 mg total) by mouth every 4 (four) hours as needed. Patient taking differently: Take 325-650 mg by mouth every 4 (four) hours as needed for mild pain.  04/04/13   Love, Evlyn Kanner, PA-C   aspirin EC 81 MG tablet Take 81 mg by mouth daily.     [provider]  calcium-vitamin D (OSCAL WITH D) 500-200 MG-UNIT tablet Take 1 tablet by mouth 2 (two) times daily.    [provider]  lenalidomide (REVLIMID) 5 MG capsule Take 5 mg by mouth See admin instructions. Take cyclically on 2 weeks, and off 1 week (on 14 days, off 7 days)    [provider]  PARoxetine (PAXIL) 20 MG tablet Take 20 mg by mouth every evening.     [provider]  psyllium (METAMUCIL) 58.6 % powder Take 1 packet by mouth daily as needed (fiber).     [provider]    Allergies:   Allergies  Allergen Reactions   Iron Polysacch Cmplx-B12-Fa Hives   Statins     Social History:  reports that he has never smoked. He has never used smokeless tobacco. He reports that he does not drink alcohol and does not use drugs.  Family History: Family History  Problem Relation Age of Onset   Diabetes Mother     Physical Exam: Vitals:   06/04/23 0130 06/04/23 0150 06/04/23 0240 06/04/23 0241  BP: (!) 142/66 (!) 140/74  (!) 105/45  Pulse: 84 75  66  Resp: 19 17  19   Temp:  100 F (37.8 C)  99.5 F (37.5 C)  TempSrc:  Oral    SpO2: Marland Kitchen)  89% 100%  100%  Weight:   92.8 kg   Height:        General:  Alert and mildly confused gentleman well developed and nourished, no acute distress Eyes: Pink conjunctiva, no scleral icterus ENT: Moist oral mucosa, neck supple, no thyromegaly Lungs: CTA,, no use of accessory muscles Cardiovascular: RRR, no regurgitation, no murmurs, JVD or carotid bruits.  Abdomen: soft, positive BS, non-tender, non-distended, no organomegaly, not an acute abdomen GU: not examined Neuro: CN II - XII grossly intact, sensation intact Musculoskeletal: strength 5/5 all extremities, no clubbing, cyanosis or edema Skin: no rash, no subcutaneous crepitation, no decubitus Psych: Mildly confused patient   Labs on Admission:  Recent Labs    06/03/23 2153   NA 139  K 4.6  CL 105  CO2 25  GLUCOSE 113*  BUN 25*  CREATININE 2.16*  CALCIUM 8.7*   Recent Labs    06/03/23 2153  AST 23  ALT 13  ALKPHOS 51  BILITOT 0.5  PROT 8.0  ALBUMIN 3.7    Recent Labs    06/03/23 2153  WBC 5.2  NEUTROABS 4.0  HGB 11.0*  HCT 34.7*  MCV 95.9  PLT 231    Micro Results: Recent Results (from the past 240 hour(s))  Resp panel by RT-PCR (RSV, Flu A&B, Covid) Anterior Nasal Swab     Status: None   Collection Time: 06/03/23  9:53 PM   Specimen: Anterior Nasal Swab  Result Value Ref Range Status   SARS Coronavirus 2 by RT PCR NEGATIVE NEGATIVE Final    Comment: (NOTE) SARS-CoV-2 target nucleic acids are NOT DETECTED.  The SARS-CoV-2 RNA is generally detectable in upper respiratory specimens during the acute phase of infection. The lowest concentration of SARS-CoV-2 viral copies this assay can detect is 138 copies/mL. A negative result does not preclude SARS-Cov-2 infection and should not be used as the sole basis for treatment or other patient management decisions. A negative result may occur with  improper specimen collection/handling, submission of specimen other than nasopharyngeal swab, presence of viral mutation(s) within the areas targeted by this assay, and inadequate number of viral copies(<138 copies/mL). A negative result must be combined with clinical observations, patient history, and epidemiological information. The expected result is Negative.  Fact Sheet for Patients:  BloggerCourse.com  Fact Sheet for Healthcare Providers:  SeriousBroker.it  This test is no t yet approved or cleared by the [place] FDA and  has been authorized for detection and/or diagnosis of SARS-CoV-2 by FDA under an Emergency Use Authorization (EUA). This EUA will remain  in effect (meaning this test can be used) for the duration of the COVID-19 declaration under Section 564(b)(1) of the Act,  21 U.S.C.section 360bbb-3(b)(1), unless the authorization is terminated  or revoked sooner.       Influenza A by PCR NEGATIVE NEGATIVE Final   Influenza B by PCR NEGATIVE NEGATIVE Final    Comment: (NOTE) The Xpert Xpress SARS-CoV-2/FLU/RSV plus assay is intended as an aid in the diagnosis of influenza from Nasopharyngeal swab specimens and should not be used as a sole basis for treatment. Nasal washings and aspirates are unacceptable for Xpert Xpress SARS-CoV-2/FLU/RSV testing.  Fact Sheet for Patients: BloggerCourse.com  Fact Sheet for Healthcare Providers: SeriousBroker.it  This test is not yet approved or cleared by the [place] FDA and has been authorized for detection and/or diagnosis of SARS-CoV-2 by FDA under an Emergency Use Authorization (EUA). This EUA will remain in effect (meaning this test can be  used) for the duration of the COVID-19 declaration under Section 564(b)(1) of the Act, 21 U.S.C. section 360bbb-3(b)(1), unless the authorization is terminated or revoked.     Resp Syncytial Virus by PCR NEGATIVE NEGATIVE Final    Comment: (NOTE) Fact Sheet for Patients: BloggerCourse.com  Fact Sheet for Healthcare Providers: SeriousBroker.it  This test is not yet approved or cleared by the [place] FDA and has been authorized for detection and/or diagnosis of SARS-CoV-2 by FDA under an Emergency Use Authorization (EUA). This EUA will remain in effect (meaning this test can be used) for the duration of the COVID-19 declaration under Section 564(b)(1) of the Act, 21 U.S.C. section 360bbb-3(b)(1), unless the authorization is terminated or revoked.  Performed at [facility], [address] [place], [place] [address]      Radiological Exams on Admission: CT Head Wo Contrast  Result Date: 06/03/2023 CLINICAL DATA:  Altered mental status EXAM:  CT HEAD WITHOUT CONTRAST TECHNIQUE: Contiguous axial images were obtained from the base of the skull through the vertex without intravenous contrast. RADIATION DOSE REDUCTION: This exam was performed according to the departmental dose-optimization program which includes automated exposure control, adjustment of the mA and/or kV according to patient size and/or use of iterative reconstruction technique. COMPARISON:  None Available. FINDINGS: Brain: No evidence of acute infarction, hemorrhage, hydrocephalus, extra-axial collection or mass lesion/mass effect. Encephalomalacia changes are noted in the left cerebellar hemisphere consistent with prior surgery. Chronic atrophic changes and ischemic changes are seen. Vascular: No hyperdense vessel or unexpected calcification. Skull: Occipital craniectomy is noted in the midline. Ines Bloomer hole is noted in the posterior right parietal region. Sinuses/Orbits: Chronic opacification of the sphenoid sinus is noted on the right with increased sclerosis. Other: None IMPRESSION: Chronic atrophic and ischemic changes. Postoperative changes in the left cerebellar hemisphere. Electronically Signed   By: Alcide Clever M.D.   On: 06/03/2023 22:35   DG Chest Port 1 View  Result Date: 06/03/2023 CLINICAL DATA:  Fever, nausea, vomiting EXAM: PORTABLE CHEST 1 VIEW COMPARISON:  06/10/2017 FINDINGS: Lungs are clear. No pneumothorax or pleural effusion. Cardiac size within normal limits. Pulmonary vascularity is normal. Implanted loop recorder noted. Thoracolumbar fusion hardware partially visualized. No acute bone abnormality. IMPRESSION: 1. No active disease. Electronically Signed   By: Helyn Numbers M.D.   On: 06/03/2023 22:32    Assessment/Plan Present on Admission: Acute cystitis //acute metabolic encephalopathy -Urine and blood cultures collected -Continue IV Rocephin -Treating underlying cause for metabolic encephalopathy  Acute on chronic kidney injury stage III A -IV fluid  hydration -BMP in a.m.  Hypertension -Wife to bring in list of home medications in the morning. -BP improved to 111/66.  As needed blood pressure medications ordered  Multiple myeloma -Patient no longer under treatment.  Jalessa Peyser 06/04/2023, 2:49 AM

## 2023-06-04 NOTE — Progress Notes (Signed)
Pt is becoming increasingly agitated. Family notes that he had similar issues during previous hospitalization. Plan to give a dose of Haldol, institute delirium precautions.

## 2023-06-04 NOTE — Plan of Care (Signed)

## 2023-06-04 NOTE — ED Notes (Signed)
[facility] arrived to transport pt to [facility], report given.  RN also called family to let them know that pt is being transferred and the room number.  No other questions at this time.

## 2023-06-05 LAB — CBC
HCT: 30.7 % — ABNORMAL LOW (ref 39.0–52.0)
Hemoglobin: 9.6 g/dL — ABNORMAL LOW (ref 13.0–17.0)
MCH: 30 pg (ref 26.0–34.0)
MCHC: 31.3 g/dL (ref 30.0–36.0)
MCV: 95.9 fL (ref 80.0–100.0)
Platelets: 200 10*3/uL (ref 150–400)
RBC: 3.2 MIL/uL — ABNORMAL LOW (ref 4.22–5.81)
RDW: 16.1 % — ABNORMAL HIGH (ref 11.5–15.5)
WBC: 7.6 10*3/uL (ref 4.0–10.5)
nRBC: 0 % (ref 0.0–0.2)

## 2023-06-05 LAB — BASIC METABOLIC PANEL
Anion gap: 6 (ref 5–15)
BUN: 31 mg/dL — ABNORMAL HIGH (ref 8–23)
CO2: 24 mmol/L (ref 22–32)
Calcium: 7.8 mg/dL — ABNORMAL LOW (ref 8.9–10.3)
Chloride: 107 mmol/L (ref 98–111)
Creatinine, Ser: 2.03 mg/dL — ABNORMAL HIGH (ref 0.61–1.24)
GFR, Estimated: 32 mL/min — ABNORMAL LOW (ref >60)
Glucose, Bld: 107 mg/dL — ABNORMAL HIGH (ref 70–99)
Potassium: 4.5 mmol/L (ref 3.5–5.1)
Sodium: 137 mmol/L (ref 135–145)

## 2023-06-05 LAB — URINE CULTURE: Culture: <10000 — AB

## 2023-06-05 LAB — CULTURE, BLOOD (ROUTINE X 2)

## 2023-06-05 MED ORDER — PAROXETINE HCL 20 MG PO TABS
20.0000 mg | ORAL_TABLET | Freq: Every day | ORAL | Status: DC
  Administered 2023-06-05 – 2023-06-06 (×2): 20 mg via ORAL
  Filled 2023-06-05 (×2): qty 1

## 2023-06-05 MED ORDER — DONEPEZIL HCL 10 MG PO TABS
10.0000 mg | ORAL_TABLET | Freq: Every day | ORAL | Status: DC
  Administered 2023-06-05: 10 mg via ORAL
  Filled 2023-06-05: qty 1

## 2023-06-05 MED ORDER — MELATONIN 3 MG PO TABS
3.0000 mg | ORAL_TABLET | Freq: Every evening | ORAL | Status: DC | PRN
  Administered 2023-06-05: 3 mg via ORAL
  Filled 2023-06-05: qty 1

## 2023-06-05 MED ORDER — MEMANTINE HCL 10 MG PO TABS
5.0000 mg | ORAL_TABLET | Freq: Two times a day (BID) | ORAL | Status: DC
  Administered 2023-06-05 – 2023-06-06 (×2): 5 mg via ORAL
  Filled 2023-06-05 (×2): qty 1

## 2023-06-05 NOTE — Assessment & Plan Note (Signed)
Mild, lives at home with family.

## 2023-06-05 NOTE — Assessment & Plan Note (Addendum)
At baseline, family think he has some memory loss, but no diagnosis.  At presenation he was somnolent and disoriented.  This is now resolved. - Standard delirium precautions: blinds open and lights on during day, TV off, minimize interruptions at night, glasses/hearing aids, PT/OT, avoiding Beers list medications

## 2023-06-05 NOTE — Progress Notes (Signed)
Physical Therapy Treatment Patient Details Name: Anthony Walls MRN: [id number] DOB: [birth year] Today's Date: 06/05/2023   History of Present Illness 82 yo male presents to [facility] with n/v, HTN. Pt found to have acute on chronic kidney injury. PMH includes HTN, multiple myeloma, CKDIII, thoracic laminectomy for tumor 2014, dementia.    PT Comments  Pt greeted resting in bed and agreeable to session with continued progress towards acute goals. Pt able to progress gait distance this session with grossly min guard assist for safety, without AD support, however pt with x1 LOB needing min A to correct. Pt with noted DOE 3/4 needing cues for less talking during mobility, as pt with tendency to be verbose, and pursed lip breathing throughout ambulation. Pt continues to require cues for posture and general safety awareness throughout OOB mobility. Pt continues to benefit from skilled PT services to progress toward functional mobility goals.      Assistance Recommended at Discharge Set up Supervision/Assistance  If plan is discharge home, recommend the following:  Can travel by private vehicle    A little help with bathing/dressing/bathroom;A little help with walking and/or transfers      Equipment Recommendations  None recommended by PT    Recommendations for Other Services       Precautions / Restrictions Precautions Precautions: Fall Restrictions Weight Bearing Restrictions: No     Mobility  Bed Mobility Overal bed mobility: Needs Assistance Bed Mobility: Supine to Sit, Sit to Supine     Supine to sit: Supervision Sit to supine: Supervision        Transfers Overall transfer level: Needs assistance Equipment used: None Transfers: Sit to/from Stand, Bed to chair/wheelchair/BSC Sit to Stand: Min guard           General transfer comment: min gaurd for safety, x1 posterior LOB with pt able to self correct, able to perform x10 at end of session     Ambulation/Gait Ambulation/Gait assistance: Min guard, Min assist Gait Distance (Feet): 400 Feet Assistive device: None Gait Pattern/deviations: Step-through pattern, Decreased stride length, Trunk flexed Gait velocity: decr     General Gait Details: trunk lean to L, requires cues to correct, min assist to steady with scanning environemtn R/L and up/down, pt with x1 LOB needing min A to correct and prevent falling, DOE3/4 during gait with walking and talking, encouraged and educated pt on breathing techniques with pt able to demonstrate back   Stairs             Wheelchair Mobility     Tilt Bed    Modified Rankin (Stroke Patients Only)       Balance Overall balance assessment: Needs assistance Sitting-balance support: No upper extremity supported, Feet supported Sitting balance-Leahy Scale: Good     Standing balance support: No upper extremity supported, During functional activity Standing balance-Leahy Scale: Fair                              Cognition Arousal/Alertness: Awake/alert Behavior During Therapy: WFL for tasks assessed/performed Overall Cognitive Status: History of cognitive impairments - at baseline                                 General Comments: Pt has history of dementia        Exercises Other Exercises Other Exercises: serial sit<>stand x10 from EOB at lowest height    General Comments General  comments (skin integrity, edema, etc.): VSS on RA, pt spouse present and supportive      Pertinent Vitals/Pain Pain Assessment Pain Assessment: No/denies pain    Home Living                          Prior Function            PT Goals (current goals can now be found in the care plan section) Acute Rehab PT Goals Patient Stated Goal: home PT Goal Formulation: With patient Time For Goal Achievement: 06/18/23 Progress towards PT goals: Progressing toward goals    Frequency    Min 3X/week       PT Plan      Co-evaluation              AM-PAC PT "6 Clicks" Mobility   Outcome Measure  Help needed turning from your back to your side while in a flat bed without using bedrails?: None Help needed moving from lying on your back to sitting on the side of a flat bed without using bedrails?: A Little Help needed moving to and from a bed to a chair (including a wheelchair)?: A Little Help needed standing up from a chair using your arms (e.g., wheelchair or bedside chair)?: A Little Help needed to walk in hospital room?: A Little Help needed climbing 3-5 steps with a railing? : A Little 6 Click Score: 19    End of Session Equipment Utilized During Treatment: Gait belt Activity Tolerance: Patient tolerated treatment well Patient left: with call bell/phone within reach;with family/visitor present;in bed Nurse Communication: Mobility status PT Visit Diagnosis: Other abnormalities of gait and mobility (R26.89);Muscle weakness (generalized) (M62.81)     Time: 1610-9604 PT Time Calculation (min) (ACUTE ONLY): 23 min  Charges:    $Gait Training: 23-37 mins PT General Charges $$ ACUTE PT VISIT: 1 Visit                     Reiss Mowrey R. PTA Acute Rehabilitation Services Office: [phone]   Catalina Antigua 06/05/2023, 3:38 PM

## 2023-06-05 NOTE — Assessment & Plan Note (Signed)
Presented with fever, encephalopathy, AKI, otherwise normal vitals, WBC normal.  Sepsis criteria not met.    Found to have bacteremia from Proteus.   - Continue Rocepin - Continue IV fluids - Follow culture sensitivities

## 2023-06-05 NOTE — Progress Notes (Signed)
Initial Nutrition Assessment  DOCUMENTATION CODES:   Obesity unspecified  INTERVENTION:  - Continue Ensure Enlive po BID, each supplement provides 350 kcal and 20 grams of protein.  NUTRITION DIAGNOSIS:  No nutrition diagnosis at this time.   REASON FOR ASSESSMENT:   Consult Assessment of nutrition requirement/status  ASSESSMENT:   82 y.o. male admits related to nausea, vomiting, AMS. PMH includes: HTN, CKD stage 3, multiple myeloma. Pt is currently receiving medical management related to acute cystitis.  Meds reviewed. Labs reviewed: BUN/Creatinine elevated.   The pt reports that he has a good appetite and has been eating well since admission. Pt also reports that he was eating well PTA. Pt denies any wt loss. No significant wt loss. Pt is currently on a Regular diet. Pt has Ensure BID and is drinking shakes. RD will continue to monitor PO intakes.   NUTRITION - FOCUSED PHYSICAL EXAM:  WDL - no wasting noted.   Diet Order:   Diet Order             Diet regular Room service appropriate? Yes; Fluid consistency: Thin  Diet effective now                   EDUCATION NEEDS:      Skin:  Skin Assessment: Reviewed RN Assessment  Last BM:  PTA  Height:   Ht Readings from Last 1 Encounters:  06/03/23 5\' 9"  (1.753 m)    Weight:   Wt Readings from Last 1 Encounters:  06/04/23 92.8 kg    Ideal Body Weight:     BMI:  Body mass index is 30.21 kg/m.  Estimated Nutritional Needs:   Kcal:  1850-2320 kcals  Protein:  90-115 gm  Fluid:  >/= 1.8 L  Bethann Humble, RD, LDN, CNSC.

## 2023-06-05 NOTE — Assessment & Plan Note (Signed)
On Revlimid with Oncology.  Has history plasmacytoma.

## 2023-06-05 NOTE — Progress Notes (Signed)
  Progress Note   Patient: Anthony Walls UJW:[id number] DOB: 21-Feb-1941 DOA: 06/03/2023     1 DOS: the patient was seen and examined on 06/05/2023 at 10:20AM      Brief hospital course: Anthony Walls is an 82 y.o. 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  Found to have GNR bacteremia.       Assessment and Plan: * Bacteremia due to Proteus species Presented with fever, encephalopathy, AKI, otherwise normal vitals, WBC normal.  Sepsis criteria not met.    Found to have bacteremia from Proteus.   - Continue Rocepin - Continue IV fluids - Follow culture sensitivities    Acute kidney injury superimposed on chronic kidney disease stage IIIa Baseline Cr 1.3 just a few weeks ago with Oncologist.  Here is 2.23 on admission.  Improved to 2.0 today, [place] negative for obstruction.    - Continue IV fluids  Acute metabolic encephalopathy At baseline, family think he has some memory loss, but no diagnosis.  At presenation he was somnolent and disoriented.  This is now resolved. - Standard delirium precautions: blinds open and lights on during day, TV off, minimize interruptions at night, glasses/hearing aids, PT/OT, avoiding Beers list medications    Multiple myeloma (HCC) On Revlimid with Oncology.  Has history plasmacytoma.  Dementia without behavioral disturbance (HCC) Mild, lives at home with family.  Obesity (BMI 30-39.9) BMI 30.2  HTN (hypertension) BP soft, not on meds at home anymore.          Subjective: Improving.  Making good urine.  Appetite poor.  Fever overnight but no cnofusion this morning, no distress, no malaise.     Physical Exam: BP (!) 114/53 (BP Location: Right Arm)   Pulse (!) 47   Temp 99.5 F (37.5 C) (Oral)   Resp 18   Ht 5\' 9"  (1.753 m)   Wt 92.8 kg   SpO2 97%   BMI 30.21 kg/m   General  adult male, sitting up in bed, interactive RRR, no murmurs, no peripheral edema Respiratory rate normal, lungs clear without rales or wheezes Abdomen soft no tenderness appreciated, no ascites or distention Attentive to questions, face symmetric, speech fluent, moves all extremities with generalized weakness but symmetric strength, memory seems slightly impaired    Data Reviewed: Patient metabolic panel shows creatinine down to 2 CBC shows hemoglobin 9.6, normal white count Blood cultures growing gram-negative rods/Proteus in 2 of 2  Family Communication: Wife at the bedside    Disposition: Status is: Inpatient The patient was admitted with encephalopathy fever, found to have gram-negative rod bacteremia, likely urinary source Renal function improving, mentation improving.  Continue IV antibiotics until we have sensitivities and discharged on oral antibiotics for 7 days         Author: Alberteen Sam, MD 06/05/2023 1:34 PM  For on call review [email or web address].

## 2023-06-05 NOTE — Assessment & Plan Note (Signed)
BP soft, not on meds at home anymore.

## 2023-06-05 NOTE — Evaluation (Signed)
Occupational Therapy Evaluation Patient Details Name: Anthony Walls MRN: [id number] DOB: 03/14/1941 Today's Date: 06/05/2023   History of Present Illness 82 yo male presents to [facility] with n/v, HTN. Pt found to have acute on chronic kidney injury. PMH includes HTN, multiple myeloma, CKDIII, thoracic laminectomy for tumor 2014, dementia.   Clinical Impression   Pt currently at min guard assist for toilet transfers and LB selfcare sit to stand.  Increased posterior lean noted with initial standing but able to correct without physical assist.  Pt lives with his spouse and was independent with mobility and ADL tasks.  Wife can provide 24 hour assist as needed.  Feel he will benefit from acute care OT at this time to help progress back to this level.  No post acute OT recommended after discharge.        Recommendations for follow up therapy are one component of a multi-disciplinary discharge planning process, led by the attending physician.  Recommendations may be updated based on patient status, additional functional criteria and insurance authorization.   Assistance Recommended at Discharge PRN  Patient can return home with the following Assistance with cooking/housework;Assist for transportation;Help with stairs or ramp for entrance;Direct supervision/assist for medications management;Direct supervision/assist for financial management    Functional Status Assessment  Patient has had a recent decline in their functional status and demonstrates the ability to make significant improvements in function in a reasonable and predictable amount of time.  Equipment Recommendations  None recommended by OT       Precautions / Restrictions Precautions Precautions: Fall Restrictions Weight Bearing Restrictions: No      Mobility Bed Mobility Overal bed mobility: Needs Assistance Bed Mobility: Supine to Sit, Sit to Supine     Supine to sit: Supervision Sit to supine: Supervision         Transfers Overall transfer level: Needs assistance Equipment used: None Transfers: Sit to/from Stand, Bed to chair/wheelchair/BSC Sit to Stand: Min guard     Step pivot transfers: Min guard     General transfer comment: Initial posterior lean in standing from EOB, but able to correct      Balance Overall balance assessment: Needs assistance Sitting-balance support: No upper extremity supported, Feet supported Sitting balance-Leahy Scale: Good     Standing balance support: No upper extremity supported, During functional activity Standing balance-Leahy Scale: Fair                             ADL either performed or assessed with clinical judgement   ADL Overall ADL's : Needs assistance/impaired Eating/Feeding: Independent;Sitting Eating/Feeding Details (indicate cue type and reason): simulated Grooming: Standing;Supervision/safety Grooming Details (indicate cue type and reason): simulated Upper Body Bathing: Set up;Sitting Upper Body Bathing Details (indicate cue type and reason): simulated Lower Body Bathing: Min guard;Sit to/from stand Lower Body Bathing Details (indicate cue type and reason): simulated Upper Body Dressing : Set up;Sitting Upper Body Dressing Details (indicate cue type and reason): simulated Lower Body Dressing: Min guard;Sit to/from stand Lower Body Dressing Details (indicate cue type and reason): simulated Toilet Transfer: Min guard;Ambulation;Comfort height toilet;Grab bars   Toileting- Clothing Manipulation and Hygiene: Min guard;Sit to/from stand   Tub/ Engineer, structural: Walk-in shower;Ambulation;Shower seat;Grab bars   Functional mobility during ADLs: Min guard (ambulation without assistive device.) General ADL Comments: Pt with increased posterior lean with initial standing.  Educated pt and spouse on the need to always scoot out to the edge of the chair or  bed before attempting to stand up.  increased left lean noted in standing and  with mobility, which was pre-existing.     Vision Baseline Vision/History: 0 No visual deficits Ability to See in Adequate Light: 0 Adequate Patient Visual Report: No change from baseline Vision Assessment?: No apparent visual deficits     Perception  Not tested   Praxis  Not tested    Pertinent Vitals/Pain Pain Assessment Pain Assessment: No/denies pain     Hand Dominance Right   Extremity/Trunk Assessment Upper Extremity Assessment Upper Extremity Assessment: Overall WFL for tasks assessed   Lower Extremity Assessment Lower Extremity Assessment: Defer to PT evaluation   Cervical / Trunk Assessment Cervical / Trunk Assessment: Normal   Communication Communication Communication: HOH   Cognition Arousal/Alertness: Awake/alert Behavior During Therapy: WFL for tasks assessed/performed Overall Cognitive Status: History of cognitive impairments - at baseline                                 General Comments: Pt has history of dementia and is currently close to baseline noted during session.                Home Living Family/patient expects to be discharged to:: Private residence Living Arrangements: Spouse/significant other Available Help at Discharge: Family;Available 24 hours/day Type of Home: House Home Access: Level entry     Home Layout: One level     Bathroom Shower/Tub: Producer, television/film/video: Handicapped height Bathroom Accessibility: Yes   Home Equipment: Agricultural consultant (2 wheels);Cane - single point;Shower seat - built in          Prior Functioning/Environment Prior Level of Function : Needs assist             Mobility Comments: pt and family report independence with mobility, no falls ADLs Comments: assist for wash up and pericare, pt incontinent and uses "pampers" at home        OT Problem List: Impaired balance (sitting and/or standing);Decreased safety awareness      OT Treatment/Interventions:  Self-care/ADL training;Patient/family education;Balance training;Neuromuscular education;Therapeutic activities;Cognitive remediation/compensation;DME and/or AE instruction    OT Goals(Current goals can be found in the care plan section) Acute Rehab OT Goals Patient Stated Goal: Pt wants to go home today. OT Goal Formulation: With patient Time For Goal Achievement: 06/19/23 Potential to Achieve Goals: Good  OT Frequency: Min 2X/week       AM-PAC OT "6 Clicks" Daily Activity     Outcome Measure Help from another person eating meals?: None Help from another person taking care of personal grooming?: A Little Help from another person toileting, which includes using toliet, bedpan, or urinal?: A Little Help from another person bathing (including washing, rinsing, drying)?: A Little Help from another person to put on and taking off regular upper body clothing?: None Help from another person to put on and taking off regular lower body clothing?: A Little 6 Click Score: 20   End of Session Equipment Utilized During Treatment: Gait belt Nurse Communication: Mobility status  Activity Tolerance: Patient tolerated treatment well Patient left: in bed;with call bell/phone within reach;with bed alarm set  OT Visit Diagnosis: Unsteadiness on feet (R26.81);Other symptoms and signs involving cognitive function                Time: 4098-1191 OT Time Calculation (min): 35 min Charges:  OT General Charges $OT Visit: 1 Visit OT Evaluation $OT Eval Moderate  Complexity: 1 Mod OT Treatments $Self Care/Home Management : 8-22 mins Perrin Maltese, OTR/L Acute Rehabilitation Services  Office [phone] 06/05/2023

## 2023-06-06 DIAGNOSIS — E669 Obesity, unspecified: Secondary | ICD-10-CM

## 2023-06-06 DIAGNOSIS — B964 Proteus (mirabilis) (morganii) as the cause of diseases classified elsewhere: Secondary | ICD-10-CM

## 2023-06-06 DIAGNOSIS — R7881 Bacteremia: Secondary | ICD-10-CM

## 2023-06-06 DIAGNOSIS — C9000 Multiple myeloma not having achieved remission: Secondary | ICD-10-CM | POA: Diagnosis not present

## 2023-06-06 DIAGNOSIS — G9341 Metabolic encephalopathy: Secondary | ICD-10-CM | POA: Diagnosis not present

## 2023-06-06 LAB — CBC
HCT: 30 % — ABNORMAL LOW (ref 39.0–52.0)
Hemoglobin: 9.7 g/dL — ABNORMAL LOW (ref 13.0–17.0)
MCH: 30.2 pg (ref 26.0–34.0)
MCHC: 32.3 g/dL (ref 30.0–36.0)
MCV: 93.5 fL (ref 80.0–100.0)
Platelets: 205 10*3/uL (ref 150–400)
RBC: 3.21 MIL/uL — ABNORMAL LOW (ref 4.22–5.81)
RDW: 16.1 % — ABNORMAL HIGH (ref 11.5–15.5)
WBC: 5.1 10*3/uL (ref 4.0–10.5)
nRBC: 0 % (ref 0.0–0.2)

## 2023-06-06 LAB — COMPREHENSIVE METABOLIC PANEL
ALT: 14 U/L (ref 0–44)
AST: 23 U/L (ref 15–41)
Albumin: 2.6 g/dL — ABNORMAL LOW (ref 3.5–5.0)
Alkaline Phosphatase: 36 U/L — ABNORMAL LOW (ref 38–126)
Anion gap: 9 (ref 5–15)
BUN: 30 mg/dL — ABNORMAL HIGH (ref 8–23)
CO2: 23 mmol/L (ref 22–32)
Calcium: 8 mg/dL — ABNORMAL LOW (ref 8.9–10.3)
Chloride: 107 mmol/L (ref 98–111)
Creatinine, Ser: 1.57 mg/dL — ABNORMAL HIGH (ref 0.61–1.24)
GFR, Estimated: 44 mL/min — ABNORMAL LOW (ref >60)
Glucose, Bld: 108 mg/dL — ABNORMAL HIGH (ref 70–99)
Potassium: 4.1 mmol/L (ref 3.5–5.1)
Sodium: 139 mmol/L (ref 135–145)
Total Bilirubin: 0.1 mg/dL — ABNORMAL LOW (ref 0.3–1.2)
Total Protein: 6.4 g/dL — ABNORMAL LOW (ref 6.5–8.1)

## 2023-06-06 LAB — CULTURE, BLOOD (ROUTINE X 2)

## 2023-06-06 MED ORDER — SENNOSIDES-DOCUSATE SODIUM 8.6-50 MG PO TABS: 1.0000 | ORAL_TABLET | Freq: Two times a day (BID) | ORAL | 0 refills | Status: AC | PRN

## 2023-06-06 MED ORDER — CEFADROXIL 500 MG PO CAPS: 1000.0000 mg | ORAL_CAPSULE | Freq: Two times a day (BID) | ORAL | 0 refills | Status: AC

## 2023-06-06 MED ORDER — CEFADROXIL 500 MG PO CAPS
1000.0000 mg | ORAL_CAPSULE | Freq: Two times a day (BID) | ORAL | Status: DC
  Administered 2023-06-06: 1000 mg via ORAL
  Filled 2023-06-06: qty 2

## 2023-06-06 NOTE — Progress Notes (Signed)
Patient is confused/ agitated and states he wants to go home. Attempted to deescalate agitation throughout night by listening to patient voice concerns. Educated patient on reason for admission and plan of care. PRN haldol given per [facility] for agitation. Patient's wife is at bedside.

## 2023-06-06 NOTE — Discharge Summary (Addendum)
Physician Discharge Summary  Anthony Walls WUJ:[id number] DOB: 06-Nov-1941 DOA: 06/03/2023  PCP: Cheral Bay, MD  Admit date: 06/03/2023 Discharge date: 06/06/2023 Admitted From: Home Disposition: Home Recommendations for Outpatient Follow-up:  Follow up with PCP in 1 week Check CMP and CBC in 1 week Please follow up on the following pending results: None  Home Health: HH PT Equipment/Devices: None  Discharge Condition: Stable CODE STATUS: Full code  Follow-up Information     Cheral Bay, MD. Schedule an appointment as soon as possible for a visit in 1 week(s).   Specialty: Family Medicine Contact information: [address] [place] [address] [phone]                 Hospital course 82 y.o. M with hx dementia,  MM and plasmacytoma of the spine (s/p resection, Radx) currently off treatment as well as HTN, and CKD IIIa baseline 1.3 who presented with N/V and somnolence, and admitted for acute cystitis with acute metabolic encephalopathy and AKI on CKD-3A.  Urine culture and blood cultures obtained.  He was started on IV ceftriaxone.   Patient's urine culture with insignificant growth.  Blood culture with pansensitive Proteus mirabilis.  Antibiotic de-escalated to p.o. cefadroxil and discharged on 5 days of cefadroxil to complete a total of 7 days.  Patient's encephalopathy resolved except for waxing and waning mental status from delirium.  Evaluated by therapy and home health PT ordered.    See individual problem list below for more.   Problems addressed during this hospitalization Principal Problem:   Bacteremia due to Proteus species Active Problems:   Acute kidney injury superimposed on chronic kidney disease stage IIIa   Acute metabolic encephalopathy   HTN (hypertension)   Obesity (BMI 30-39.9)   Dementia with behavioral disturbance (HCC)   Multiple myeloma (HCC)   Bacteremia due to Proteus species: Blood culture with pansensitive  Proteus mirabilis.  Received IV ceftriaxone for 2 days and discharged on p.o. cefadroxil for 5 more days to complete treatment course. Sepsis criteria not met.     AKI on CKD-3A: Baseline Cr about 1.3.  Renal US without acute finding.  AKI resolving. Recent Labs    06/03/23 2153 06/04/23 0319 06/05/23 0431 06/06/23 0847  BUN 25* 26* 31* 30*  CREATININE 2.16* 2.23* 2.03* 1.57*  -Encourage hydration. -Recheck renal function at follow-up  Acute metabolic encephalopathy/delirium: Encephalopathy likely due to bacteremia.  Encephalopathy seems to have resolved but waxing and waning mental status from delirium.  He was sleepy but wakes to voice and oriented to self, place and person but not time.  No focal neurodeficit.   Multiple myeloma (HCC): On Revlimid with Oncology.  Has history plasmacytoma.   Dementia with behavioral disturbance/delirium -Reorientation and delirium precaution   Hypertension: Normotensive -Continue home medications  Nausea and vomiting: Likely due to bacteremia.  Resolved.  Obesity Body mass index is 30.21 kg/m.            Time spent 35 minutes  Vital signs Vitals:   06/05/23 1635 06/05/23 2042 06/06/23 0514 06/06/23 0725  BP: (!) 159/57 (!) 148/71 135/63 118/61  Pulse: 74 67 (!) 56 (!) 53  Temp: 98.8 F (37.1 C) 98.7 F (37.1 C) 99.3 F (37.4 C) 97.6 F (36.4 C)  Resp:  18 18 17   Height:      Weight:      SpO2: 100% 98% 99% 100%  TempSrc:   Oral Oral  BMI (Calculated):  Discharge exam  GENERAL: No apparent distress.  Nontoxic. HEENT: MMM.  Vision and hearing grossly intact.  NECK: Supple.  No apparent JVD.  RESP:  No IWOB.  Fair aeration bilaterally. CVS:  RRR. Heart sounds normal.  ABD/GI/GU: BS+. Abd soft, NTND.  MSK/EXT:  Moves extremities. No apparent deformity. No edema.  SKIN: no apparent skin lesion or wound NEURO: Sleepy but wakes to voice.  Oriented to self, person and place.  Follows commands.  No apparent focal  neuro deficit. PSYCH: Calm. Normal affect.   Discharge Instructions Discharge Instructions     Diet general   Complete by: As directed    Discharge instructions   Complete by: As directed    It has been a pleasure taking care of you!  You were hospitalized due to bloodstream infection for which you have been treated with antibiotics.  We are discharging you more antibiotics to complete treatment course.  It is very important that you complete the whole course of antibiotics.  Follow-up with your primary care doctor in 1 to 2 weeks or sooner if needed.   Take care,   Increase activity slowly   Complete by: As directed       Allergies as of 06/06/2023       Reactions   Iron Polysacch Cmplx-b12-fa Hives   Sulfamethoxazole-trimethoprim Itching   Statins         Medication List     TAKE these medications    acyclovir 400 MG tablet Commonly known as: ZOVIRAX Take 400 mg by mouth daily.   amLODipine 2.5 MG tablet Commonly known as: NORVASC Take 2.5 mg by mouth daily.   aspirin EC 81 MG tablet Take 81 mg by mouth daily.   b complex vitamins capsule Take 1 capsule by mouth 2 (two) times a week. Sunday's, Thursday's   calcium-vitamin D 500-200 MG-UNIT tablet Commonly known as: OSCAL WITH D Take 1 tablet by mouth 2 (two) times daily.   cefadroxil 500 MG capsule Commonly known as: DURICEF Take 2 capsules (1,000 mg total) by mouth 2 (two) times daily for 5 days.   donepezil 10 MG tablet Commonly known as: ARICEPT Take 10 mg by mouth at bedtime.   ezetimibe 10 MG tablet Commonly known as: ZETIA Take 10 mg by mouth at bedtime.   finasteride 5 MG tablet Commonly known as: PROSCAR Take 5 mg by mouth daily.   memantine 5 MG tablet Commonly known as: NAMENDA Take 5 mg by mouth 2 (two) times daily.   PARoxetine 20 MG tablet Commonly known as: PAXIL Take 20 mg by mouth every evening.   psyllium 58.6 % powder Commonly known as: METAMUCIL Take 1 packet by mouth  daily as needed (fiber).   senna-docusate 8.6-50 MG tablet Commonly known as: Senokot-S Take 1 tablet by mouth 2 (two) times daily between meals as needed for mild constipation.        Consultations: None  Procedures/Studies:   US RENAL  Result Date: 06/04/2023 CLINICAL DATA:  Acute kidney injury Hypertension EXAM: RENAL / URINARY TRACT ULTRASOUND COMPLETE COMPARISON:  None available FINDINGS: Right Kidney: Renal measurements: 10.3 x 5.7 x 5.4 cm = volume: 166 mL. Echogenicity within normal limits. No mass or hydronephrosis visualized. Left Kidney: Renal measurements: 10.5 x 5.5 x 4.8 cm = volume: 136 mL. Echogenicity within normal limits. No mass or hydronephrosis visualized. Bladder: Appears normal for degree of bladder distention. Other: None. IMPRESSION: No significant sonographic abnormality of the kidneys. Electronically Signed   By: Mauri Reading  Mir M.D.   On: 06/04/2023 09:54   CT Head Wo Contrast  Result Date: 06/03/2023 CLINICAL DATA:  Altered mental status EXAM: CT HEAD WITHOUT CONTRAST TECHNIQUE: Contiguous axial images were obtained from the base of the skull through the vertex without intravenous contrast. RADIATION DOSE REDUCTION: This exam was performed according to the departmental dose-optimization program which includes automated exposure control, adjustment of the mA and/or kV according to patient size and/or use of iterative reconstruction technique. COMPARISON:  None Available. FINDINGS: Brain: No evidence of acute infarction, hemorrhage, hydrocephalus, extra-axial collection or mass lesion/mass effect. Encephalomalacia changes are noted in the left cerebellar hemisphere consistent with prior surgery. Chronic atrophic changes and ischemic changes are seen. Vascular: No hyperdense vessel or unexpected calcification. Skull: Occipital craniectomy is noted in the midline. Ines Bloomer hole is noted in the posterior right parietal region. Sinuses/Orbits: Chronic opacification of the  sphenoid sinus is noted on the right with increased sclerosis. Other: None IMPRESSION: Chronic atrophic and ischemic changes. Postoperative changes in the left cerebellar hemisphere. Electronically Signed   By: Alcide Clever M.D.   On: 06/03/2023 22:35   DG Chest Port 1 View  Result Date: 06/03/2023 CLINICAL DATA:  Fever, nausea, vomiting EXAM: PORTABLE CHEST 1 VIEW COMPARISON:  06/10/2017 FINDINGS: Lungs are clear. No pneumothorax or pleural effusion. Cardiac size within normal limits. Pulmonary vascularity is normal. Implanted loop recorder noted. Thoracolumbar fusion hardware partially visualized. No acute bone abnormality. IMPRESSION: 1. No active disease. Electronically Signed   By: Helyn Numbers M.D.   On: 06/03/2023 22:32       The results of significant diagnostics from this hospitalization (including imaging, microbiology, ancillary and laboratory) are listed below for reference.     Microbiology: Recent Results (from the past 240 hour(s))  Blood Culture (routine x 2)     Status: Abnormal   Collection Time: 06/03/23  9:51 PM   Specimen: BLOOD  Result Value Ref Range Status   Specimen Description   Final    BLOOD RIGHT ANTECUBITAL Performed at [facility], [address] [place], [place] [address]    Special Requests   Final    BOTTLES DRAWN AEROBIC AND ANAEROBIC Blood Culture adequate volume Performed at [facility], [address] [place], [place] [address]    Culture  Setup Time   Final    GRAM NEGATIVE RODS IN BOTH AEROBIC AND ANAEROBIC BOTTLES CRITICAL RESULT CALLED TO, READ BACK BY AND VERIFIED WITH: Hiram Comber [id number] @ 2024 FH Performed at [facility], [address], [place], [place] [address]    Culture PROTEUS MIRABILIS (A)  Final   Report Status 06/06/2023 FINAL  Final   Organism ID, Bacteria PROTEUS MIRABILIS  Final      Susceptibility   Proteus mirabilis - MIC*    AMPICILLIN <=2 SENSITIVE Sensitive     CEFEPIME  <=0.12 SENSITIVE Sensitive     CEFTAZIDIME <=1 SENSITIVE Sensitive     CEFTRIAXONE <=0.25 SENSITIVE Sensitive     CIPROFLOXACIN <=0.25 SENSITIVE Sensitive     GENTAMICIN <=1 SENSITIVE Sensitive     IMIPENEM 2 SENSITIVE Sensitive     TRIMETH/SULFA <=20 SENSITIVE Sensitive     AMPICILLIN/SULBACTAM <=2 SENSITIVE Sensitive     PIP/TAZO <=4 SENSITIVE Sensitive     * PROTEUS MIRABILIS  Blood Culture ID Panel (Reflexed)     Status: Abnormal   Collection Time: 06/03/23  9:51 PM  Result Value Ref Range Status   Enterococcus  faecalis NOT DETECTED NOT DETECTED Final   Enterococcus Faecium NOT DETECTED NOT DETECTED Final   Listeria monocytogenes NOT DETECTED NOT DETECTED Final   Staphylococcus species NOT DETECTED NOT DETECTED Final   Staphylococcus aureus (BCID) NOT DETECTED NOT DETECTED Final   Staphylococcus epidermidis NOT DETECTED NOT DETECTED Final   Staphylococcus lugdunensis NOT DETECTED NOT DETECTED Final   Streptococcus species NOT DETECTED NOT DETECTED Final   Streptococcus agalactiae NOT DETECTED NOT DETECTED Final   Streptococcus pneumoniae NOT DETECTED NOT DETECTED Final   Streptococcus pyogenes NOT DETECTED NOT DETECTED Final   A.calcoaceticus-baumannii NOT DETECTED NOT DETECTED Final   Bacteroides fragilis NOT DETECTED NOT DETECTED Final   Enterobacterales DETECTED (A) NOT DETECTED Final    Comment: Enterobacterales represent a large order of gram negative bacteria, not a single organism. CRITICAL RESULT CALLED TO, READ BACK BY AND VERIFIED WITH: Ferd Glassing [facility] [id number] @ 2024 FH    Enterobacter cloacae complex NOT DETECTED NOT DETECTED Final   Escherichia coli NOT DETECTED NOT DETECTED Final   Klebsiella aerogenes NOT DETECTED NOT DETECTED Final   Klebsiella oxytoca NOT DETECTED NOT DETECTED Final   Klebsiella pneumoniae NOT DETECTED NOT DETECTED Final   Proteus species DETECTED (A) NOT DETECTED Final    Comment: CRITICAL RESULT CALLED TO, READ BACK BY AND VERIFIED  WITH: Hiram Comber [id number] @ 2024 FH    Salmonella species NOT DETECTED NOT DETECTED Final   Serratia marcescens NOT DETECTED NOT DETECTED Final   Haemophilus influenzae NOT DETECTED NOT DETECTED Final   Neisseria meningitidis NOT DETECTED NOT DETECTED Final   Pseudomonas aeruginosa NOT DETECTED NOT DETECTED Final   Stenotrophomonas maltophilia NOT DETECTED NOT DETECTED Final   Candida albicans NOT DETECTED NOT DETECTED Final   Candida auris NOT DETECTED NOT DETECTED Final   Candida glabrata NOT DETECTED NOT DETECTED Final   Candida krusei NOT DETECTED NOT DETECTED Final   Candida parapsilosis NOT DETECTED NOT DETECTED Final   Candida tropicalis NOT DETECTED NOT DETECTED Final   Cryptococcus neoformans/gattii NOT DETECTED NOT DETECTED Final   CTX-M ESBL NOT DETECTED NOT DETECTED Final   Carbapenem resistance IMP NOT DETECTED NOT DETECTED Final   Carbapenem resistance KPC NOT DETECTED NOT DETECTED Final   Carbapenem resistance NDM NOT DETECTED NOT DETECTED Final   Carbapenem resist OXA 48 LIKE NOT DETECTED NOT DETECTED Final   Carbapenem resistance VIM NOT DETECTED NOT DETECTED Final    Comment: Performed at [facility], [address] [place], [place] [address]  Resp panel by RT-PCR (RSV, Flu A&B, Covid) Anterior Nasal Swab     Status: None   Collection Time: 06/03/23  9:53 PM   Specimen: Anterior Nasal Swab  Result Value Ref Range Status   SARS Coronavirus 2 by RT PCR NEGATIVE NEGATIVE Final    Comment: (NOTE) SARS-CoV-2 target nucleic acids are NOT DETECTED.  The SARS-CoV-2 RNA is generally detectable in upper respiratory specimens during the acute phase of infection. The lowest concentration of SARS-CoV-2 viral copies this assay can detect is 138 copies/mL. A negative result does not preclude SARS-Cov-2 infection and should not be used as the sole basis for treatment or other patient management decisions. A negative result may occur with  improper specimen  collection/handling, submission of specimen other than nasopharyngeal swab, presence of viral mutation(s) within the areas targeted by this assay, and inadequate number of viral copies(<138 copies/mL). A negative result must be combined with clinical observations, patient history, and epidemiological information. The expected  result is Negative.  Fact Sheet for Patients:  BloggerCourse.com  Fact Sheet for Healthcare Providers:  SeriousBroker.it  This test is no t yet approved or cleared by the [place] FDA and  has been authorized for detection and/or diagnosis of SARS-CoV-2 by FDA under an Emergency Use Authorization (EUA). This EUA will remain  in effect (meaning this test can be used) for the duration of the COVID-19 declaration under Section 564(b)(1) of the Act, 21 U.S.C.section 360bbb-3(b)(1), unless the authorization is terminated  or revoked sooner.       Influenza A by PCR NEGATIVE NEGATIVE Final   Influenza B by PCR NEGATIVE NEGATIVE Final    Comment: (NOTE) The Xpert Xpress SARS-CoV-2/FLU/RSV plus assay is intended as an aid in the diagnosis of influenza from Nasopharyngeal swab specimens and should not be used as a sole basis for treatment. Nasal washings and aspirates are unacceptable for Xpert Xpress SARS-CoV-2/FLU/RSV testing.  Fact Sheet for Patients: BloggerCourse.com  Fact Sheet for Healthcare Providers: SeriousBroker.it  This test is not yet approved or cleared by the [place] FDA and has been authorized for detection and/or diagnosis of SARS-CoV-2 by FDA under an Emergency Use Authorization (EUA). This EUA will remain in effect (meaning this test can be used) for the duration of the COVID-19 declaration under Section 564(b)(1) of the Act, 21 U.S.C. section 360bbb-3(b)(1), unless the authorization is terminated or revoked.     Resp Syncytial  Virus by PCR NEGATIVE NEGATIVE Final    Comment: (NOTE) Fact Sheet for Patients: BloggerCourse.com  Fact Sheet for Healthcare Providers: SeriousBroker.it  This test is not yet approved or cleared by the [place] FDA and has been authorized for detection and/or diagnosis of SARS-CoV-2 by FDA under an Emergency Use Authorization (EUA). This EUA will remain in effect (meaning this test can be used) for the duration of the COVID-19 declaration under Section 564(b)(1) of the Act, 21 U.S.C. section 360bbb-3(b)(1), unless the authorization is terminated or revoked.  Performed at [facility], [address] [place], [place] [address]   Blood Culture (routine x 2)     Status: Abnormal   Collection Time: 06/03/23 10:05 PM   Specimen: BLOOD  Result Value Ref Range Status   Specimen Description   Final    BLOOD LEFT ANTECUBITAL Performed at [facility], [address] [place], [place] [address]    Special Requests   Final    BOTTLES DRAWN AEROBIC ONLY Blood Culture results may not be optimal due to an inadequate volume of blood received in culture bottles Performed at [facility], [address] [place], [place] [address]    Culture  Setup Time   Final    GRAM NEGATIVE RODS BOTTLES DRAWN AEROBIC ONLY CRITICAL RESULT CALLED TO, READ BACK BY AND VERIFIED WITH: Ferd Glassing DOHLEN [id number] @ 2024 FH    Culture (A)  Final    PROTEUS MIRABILIS SUSCEPTIBILITIES PERFORMED ON PREVIOUS CULTURE WITHIN THE LAST 5 DAYS. Performed at [facility], [address], [place], [place] [address]    Report Status 06/06/2023 FINAL  Final  Urine Culture     Status: Abnormal   Collection Time: 06/04/23  5:26 AM   Specimen: Urine, Clean Catch  Result Value Ref Range Status   Specimen Description URINE, CLEAN CATCH  Final   Special Requests NONE  Final   Culture (A)  Final    <10,000 COLONIES/mL  INSIGNIFICANT GROWTH Performed at  [facility], [address] [place], [place], [place] [address]    Report Status 06/05/2023 FINAL  Final     Labs:  CBC: Recent Labs  Lab 06/03/23 2153 06/04/23 0319 06/05/23 0431 06/06/23 0847  WBC 5.2 10.0 7.6 5.1  NEUTROABS 4.0 8.0*  --   --   HGB 11.0* 10.4* 9.6* 9.7*  HCT 34.7* 32.2* 30.7* 30.0*  MCV 95.9 95.8 95.9 93.5  PLT 231 237 200 205   BMP &GFR Recent Labs  Lab 06/03/23 2153 06/04/23 0319 06/05/23 0431 06/06/23 0847  NA 139 139 137 139  K 4.6 4.8 4.5 4.1  CL 105 103 107 107  CO2 25 22 24 23   GLUCOSE 113* 129* 107* 108*  BUN 25* 26* 31* 30*  CREATININE 2.16* 2.23* 2.03* 1.57*  CALCIUM 8.7* 8.9 7.8* 8.0*   Estimated Creatinine Clearance: 40.8 mL/min (A) (by C-G formula based on SCr of 1.57 mg/dL (H)). Liver & Pancreas: Recent Labs  Lab 06/03/23 2153 06/06/23 0847  AST 23 23  ALT 13 14  ALKPHOS 51 36*  BILITOT 0.5 0.1*  PROT 8.0 6.4*  ALBUMIN 3.7 2.6*   No results for input(s): "LIPASE", "AMYLASE" in the last 168 hours. No results for input(s): "AMMONIA" in the last 168 hours. Diabetic: No results for input(s): "HGBA1C" in the last 72 hours. No results for input(s): "GLUCAP" in the last 168 hours. Cardiac Enzymes: No results for input(s): "CKTOTAL", "CKMB", "CKMBINDEX", "TROPONINI" in the last 168 hours. No results for input(s): "PROBNP" in the last 8760 hours. Coagulation Profile: Recent Labs  Lab 06/03/23 2153  INR 1.1   Thyroid Function Tests: No results for input(s): "TSH", "T4TOTAL", "FREET4", "T3FREE", "THYROIDAB" in the last 72 hours. Lipid Profile: No results for input(s): "CHOL", "HDL", "LDLCALC", "TRIG", "CHOLHDL", "LDLDIRECT" in the last 72 hours. Anemia Panel: No results for input(s): "VITAMINB12", "FOLATE", "FERRITIN", "TIBC", "IRON", "RETICCTPCT" in the last 72 hours. Urine analysis:    Component Value Date/Time   COLORURINE YELLOW 06/03/2023 2317   APPEARANCEUR CLOUDY (A) 06/03/2023  2317   LABSPEC 1.020 06/03/2023 2317   PHURINE 8.5 (H) 06/03/2023 2317   GLUCOSEU NEGATIVE 06/03/2023 2317   HGBUR LARGE (A) 06/03/2023 2317   BILIRUBINUR NEGATIVE 06/03/2023 2317   KETONESUR NEGATIVE 06/03/2023 2317   PROTEINUR 100 (A) 06/03/2023 2317   UROBILINOGEN 0.2 03/25/2013 2140   NITRITE NEGATIVE 06/03/2023 2317   LEUKOCYTESUR MODERATE (A) 06/03/2023 2317   Sepsis Labs: Invalid input(s): "PROCALCITONIN", "LACTICIDVEN"   SIGNED:  Almon Hercules, MD  [facility] 06/06/2023, 5:12 PM

## 2024-06-14 ENCOUNTER — Ambulatory Visit
Admission: EM | Admit: 2024-06-14 | Discharge: 2024-06-14 | Disposition: A | Discharge status: Home / Self Care | Attending: Physician Assistant | Admitting: Physician Assistant

## 2024-06-14 ENCOUNTER — Other Ambulatory Visit: Payer: Self-pay

## 2024-06-14 DIAGNOSIS — H6123 Impacted cerumen, bilateral: Secondary | ICD-10-CM | POA: Diagnosis not present

## 2024-06-14 MED ORDER — CARBAMIDE PEROXIDE 6.5 % OT SOLN
5.0000 [drp] | Freq: Once | OTIC | Status: AC
  Administered 2024-06-14: 5 [drp] via OTIC

## 2024-06-14 NOTE — ED Provider Notes (Signed)
 [facility]    CSN: [id number] Arrival date & time: 06/14/24  1318      History   Chief Complaint Chief Complaint  Patient presents with  . Ear Fullness    HPI Anthony Walls is a 83 y.o. male.   HPI  Pt is here today with two family members  He and his family report concerns for ear fullness and decreased hearing  He denies ear pain, drainage, headaches, fever or chills   Past Medical History:  Diagnosis Date  . Arthritis   . Hesitancy    ?BPH  . Hypertension   . Multiple myeloma (HCC) 2014  . 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 Acute kidney injury superimposed on chronic kidney disease stage IIIa 06/04/2023  . Obesity (BMI 30-39.9) 06/04/2023  . Acute metabolic encephalopathy 06/04/2023  . Dementia with behavioral disturbance (HCC) 06/04/2023  . Multiple myeloma (HCC) 06/04/2023  . Bradycardia 06/17/2017  . HTN (hypertension) 03/26/2013    Past Surgical History:  Procedure Laterality Date  . BRAIN SURGERY  89   tumor  . LAMINECTOMY N/A 03/20/2013   Procedure: THORACIC LAMINECTOMY FOR TUMOR;  Surgeon: Darina MALVA Boehringer, MD;  Location: [facility];  Service: Neurosurgery;  Laterality: N/A;  Thoracic Laminectomy for Thoracic Ten Tumor, Pedicle Screws at Thoracic Nine through Eleven  . LOOP RECORDER INSERTION N/A 08/02/2017   Procedure: LOOP RECORDER INSERTION;  Surgeon: Inocencio Soyla Lunger, MD;  Location: [facility];  Service: Cardiovascular;  Laterality: N/A;  . TRANSURETHRAL RESECTION OF PROSTATE         Home Medications    Prior to Admission medications   Medication Sig Start Date End Date Taking? Authorizing Provider  acyclovir (ZOVIRAX) 400 MG tablet Take 400 mg by mouth daily. 10/23/22   [provider]  amLODipine (NORVASC) 2.5 MG tablet Take 2.5 mg by mouth daily. 10/25/22   [provider]  aspirin  EC 81  MG tablet Take 81 mg by mouth daily.     [provider]  b complex vitamins capsule Take 1 capsule by mouth 2 (two) times a week. Sunday's, Thursday's    [provider]  calcium -vitamin D (OSCAL WITH D) 500-200 MG-UNIT tablet Take 1 tablet by mouth 2 (two) times daily.    [provider]  donepezil  (ARICEPT ) 10 MG tablet Take 10 mg by mouth at bedtime. 08/30/22   [provider]  ezetimibe (ZETIA) 10 MG tablet Take 10 mg by mouth at bedtime. 01/18/23   [provider]  finasteride (PROSCAR) 5 MG tablet Take 5 mg by mouth daily. 08/09/22   [provider]  memantine  (NAMENDA ) 5 MG tablet Take 5 mg by mouth 2 (two) times daily. 02/01/23   [provider]  PARoxetine  (PAXIL ) 20 MG tablet Take 20 mg by mouth every evening.     [provider]  psyllium (METAMUCIL) 58.6 % powder Take 1 packet by mouth daily as needed (fiber).     [provider]  senna-docusate (SENOKOT-S) 8.6-50 MG tablet Take 1 tablet by mouth 2 (two) times daily between meals as needed for mild constipation. 06/06/23   Gonfa, Taye T, MD    Family History Family History  Problem Relation Age of Onset  . Diabetes Mother     Social History Social History   Tobacco Use  . Smoking status: Never  . Smokeless tobacco:  Never  Vaping Use  . Vaping status: Never Used  Substance Use Topics  . Alcohol use: No  . Drug use: No     Allergies   Iron  polysacch [id number], Sulfamethoxazole-trimethoprim, and Statins   Review of Systems Review of Systems  Constitutional:  Negative for chills and fever.  HENT:  Negative for ear discharge, ear pain and sore throat.   Neurological:  Negative for headaches.     Physical Exam Triage Vital Signs ED Triage Vitals  Encounter Vitals Group     BP 06/14/24 1345 138/61     Girls Systolic BP Percentile --      Girls Diastolic BP Percentile --      Boys Systolic BP Percentile --      Boys Diastolic BP  Percentile --      Pulse Rate 06/14/24 1345 61     Resp 06/14/24 1345 18     Temp 06/14/24 1345 99.1 F (37.3 C)     Temp Source 06/14/24 1345 Oral     SpO2 06/14/24 1345 95 %     Weight 06/14/24 1345 182 lb (82.6 kg)     Height 06/14/24 1353 5' 9 (1.753 m)     Head Circumference --      Peak Flow --      Pain Score 06/14/24 1353 0     Pain Loc --      Pain Education --      Exclude from Growth Chart --    No data found.  Updated Vital Signs BP 138/61 (BP Location: Left Arm)   Pulse 61   Temp 99.1 F (37.3 C) (Oral)   Resp 18   Ht 5' 9 (1.753 m)   Wt 182 lb (82.6 kg)   SpO2 95%   BMI 26.88 kg/m   Visual Acuity Right Eye Distance:   Left Eye Distance:   Bilateral Distance:    Right Eye Near:   Left Eye Near:    Bilateral Near:     Physical Exam Vitals reviewed.  Constitutional:      General: He is awake.     Appearance: Normal appearance. He is well-developed and well-groomed.  HENT:     Head: Normocephalic and atraumatic.     Right Ear: There is impacted cerumen.     Left Ear: There is impacted cerumen.  Eyes:     Extraocular Movements: Extraocular movements intact.     Conjunctiva/sclera: Conjunctivae normal.  Pulmonary:     Effort: Pulmonary effort is normal.  Musculoskeletal:     Cervical back: Normal range of motion.  Neurological:     Mental Status: He is alert and oriented to person, place, and time.  Psychiatric:        Attention and Perception: Attention normal.        Mood and Affect: Mood normal.        Speech: Speech normal.        Behavior: Behavior normal. Behavior is cooperative.      UC Treatments / Results  Labs (all labs ordered are listed, but only abnormal results are displayed) Labs Reviewed - No data to display  EKG   Radiology No results found.  Procedures Procedures (including critical care time)  Medications Ordered in UC Medications - No data to display  Initial Impression / Assessment and Plan / UC Course   I have reviewed the triage vital signs and the nursing notes.  Pertinent labs & imaging results that were available during my  care of the patient were reviewed by me and considered in my medical decision making (see chart for details).      Final Clinical Impressions(s) / UC Diagnoses   Final diagnoses:  None   Discharge Instructions   None    ED Prescriptions   None    PDMP not reviewed this encounter.

## 2024-06-14 NOTE — Discharge Instructions (Addendum)
 I do not recommend cleaning your ears with Qtips Small items should not be inserted into the ear as this can lead to damage to the canal and potential perforation of the ear drum Instead you can pour a small amount of hydrogen peroxide and warm water into the canal or get an ear lavage kit to use at home.  If the ear wash kit comes with small tools or cleaning instruments- throw these away. They should not be used by people other than a trained provider who can see into the ear canal and avoid puncturing the ear drum. In terms of over the counter kits, I like Wax Blaster MD. This has a spray bottle and ear drops that seem to be very effective for removing built up earwax.  Do not use if you are having pain or fevers as found in an ear infection as the ear drum may not be able to handle this procedure  You can also use a few drops of Debrox to help soften the earwax and aid in expulsion. I recommend instilling them into both ears once per day for about 7 days which should soften the wax enough to allow it to come out naturally.

## 2024-06-14 NOTE — ED Triage Notes (Signed)
 Pt brought in by family members. Pt presents with complaints of bilateral ear fullness x 4 days. Currently denies pain. Family members state his hearing has decreased causing them to have to talk louder.
